# Patient Record
Sex: Female | Born: 1950 | Race: White | Hispanic: No | Smoking: Never smoker
Health system: Southern US, Community
[De-identification: ages and names within clinical notes are randomized; demographics above are authoritative.]

## PROBLEM LIST (undated history)

## (undated) DIAGNOSIS — M1990 Unspecified osteoarthritis, unspecified site: Secondary | ICD-10-CM

## (undated) DIAGNOSIS — K219 Gastro-esophageal reflux disease without esophagitis: Secondary | ICD-10-CM

## (undated) DIAGNOSIS — G4700 Insomnia, unspecified: Secondary | ICD-10-CM

## (undated) DIAGNOSIS — F32A Depression, unspecified: Secondary | ICD-10-CM

## (undated) DIAGNOSIS — J189 Pneumonia, unspecified organism: Secondary | ICD-10-CM

## (undated) DIAGNOSIS — J45909 Unspecified asthma, uncomplicated: Secondary | ICD-10-CM

## (undated) DIAGNOSIS — J40 Bronchitis, not specified as acute or chronic: Secondary | ICD-10-CM

## (undated) DIAGNOSIS — E785 Hyperlipidemia, unspecified: Secondary | ICD-10-CM

## (undated) DIAGNOSIS — G8929 Other chronic pain: Secondary | ICD-10-CM

## (undated) DIAGNOSIS — J069 Acute upper respiratory infection, unspecified: Secondary | ICD-10-CM

## (undated) DIAGNOSIS — F419 Anxiety disorder, unspecified: Secondary | ICD-10-CM

## (undated) DIAGNOSIS — H1010 Acute atopic conjunctivitis, unspecified eye: Secondary | ICD-10-CM

## (undated) DIAGNOSIS — R0600 Dyspnea, unspecified: Secondary | ICD-10-CM

## (undated) HISTORY — DX: Acute atopic conjunctivitis, unspecified eye: H10.10

## (undated) HISTORY — DX: Unspecified asthma, uncomplicated: J45.909

## (undated) HISTORY — DX: Acute upper respiratory infection, unspecified: J06.9

## (undated) HISTORY — PX: AUGMENTATION MAMMAPLASTY: SUR837

## (undated) HISTORY — PX: ADENOIDECTOMY: SUR15

---

## 1975-10-08 HISTORY — PX: TONSILLECTOMY AND ADENOIDECTOMY: SUR1326

## 2000-08-05 ENCOUNTER — Ambulatory Visit (HOSPITAL_COMMUNITY): Admission: RE | Admit: 2000-08-05 | Discharge: 2000-08-05 | Payer: Self-pay

## 2003-06-23 ENCOUNTER — Other Ambulatory Visit: Admission: RE | Admit: 2003-06-23 | Discharge: 2003-06-23 | Payer: Self-pay | Admitting: Obstetrics and Gynecology

## 2005-07-19 ENCOUNTER — Other Ambulatory Visit: Admission: RE | Admit: 2005-07-19 | Discharge: 2005-07-19 | Payer: Self-pay | Admitting: Obstetrics and Gynecology

## 2005-08-23 ENCOUNTER — Ambulatory Visit (HOSPITAL_COMMUNITY): Admission: RE | Admit: 2005-08-23 | Discharge: 2005-08-23 | Payer: Self-pay | Admitting: Obstetrics and Gynecology

## 2005-08-23 ENCOUNTER — Encounter (INDEPENDENT_AMBULATORY_CARE_PROVIDER_SITE_OTHER): Payer: Self-pay | Admitting: Specialist

## 2007-02-23 ENCOUNTER — Encounter (INDEPENDENT_AMBULATORY_CARE_PROVIDER_SITE_OTHER): Payer: Self-pay | Admitting: Gastroenterology

## 2007-02-23 ENCOUNTER — Ambulatory Visit (HOSPITAL_COMMUNITY): Admission: RE | Admit: 2007-02-23 | Discharge: 2007-02-23 | Payer: Self-pay | Admitting: Gastroenterology

## 2007-03-02 ENCOUNTER — Emergency Department (HOSPITAL_COMMUNITY): Admission: EM | Admit: 2007-03-02 | Discharge: 2007-03-02 | Payer: Self-pay | Admitting: Emergency Medicine

## 2007-11-15 ENCOUNTER — Emergency Department (HOSPITAL_COMMUNITY): Admission: EM | Admit: 2007-11-15 | Discharge: 2007-11-15 | Payer: Self-pay | Admitting: Family Medicine

## 2008-12-26 ENCOUNTER — Emergency Department (HOSPITAL_COMMUNITY): Admission: EM | Admit: 2008-12-26 | Discharge: 2008-12-26 | Payer: Self-pay | Admitting: Emergency Medicine

## 2009-01-24 ENCOUNTER — Ambulatory Visit: Payer: Self-pay | Admitting: Internal Medicine

## 2009-03-20 ENCOUNTER — Ambulatory Visit: Payer: Self-pay | Admitting: Internal Medicine

## 2009-05-25 ENCOUNTER — Ambulatory Visit: Payer: Self-pay | Admitting: Internal Medicine

## 2009-09-07 ENCOUNTER — Emergency Department (HOSPITAL_COMMUNITY): Admission: EM | Admit: 2009-09-07 | Discharge: 2009-09-07 | Payer: Self-pay | Admitting: Family Medicine

## 2009-12-05 ENCOUNTER — Ambulatory Visit: Payer: Self-pay | Admitting: Internal Medicine

## 2010-03-02 ENCOUNTER — Ambulatory Visit: Payer: Self-pay | Admitting: Internal Medicine

## 2010-04-05 ENCOUNTER — Ambulatory Visit: Payer: Self-pay | Admitting: Internal Medicine

## 2010-04-05 ENCOUNTER — Telehealth (INDEPENDENT_AMBULATORY_CARE_PROVIDER_SITE_OTHER): Payer: Self-pay | Admitting: *Deleted

## 2010-04-13 ENCOUNTER — Ambulatory Visit: Payer: Self-pay | Admitting: Internal Medicine

## 2010-04-13 DIAGNOSIS — J309 Allergic rhinitis, unspecified: Secondary | ICD-10-CM | POA: Insufficient documentation

## 2010-04-13 DIAGNOSIS — J3089 Other allergic rhinitis: Secondary | ICD-10-CM | POA: Insufficient documentation

## 2010-04-14 DIAGNOSIS — H1045 Other chronic allergic conjunctivitis: Secondary | ICD-10-CM | POA: Insufficient documentation

## 2010-04-14 DIAGNOSIS — H1010 Acute atopic conjunctivitis, unspecified eye: Secondary | ICD-10-CM | POA: Insufficient documentation

## 2010-04-24 ENCOUNTER — Telehealth (INDEPENDENT_AMBULATORY_CARE_PROVIDER_SITE_OTHER): Payer: Self-pay | Admitting: *Deleted

## 2010-05-03 ENCOUNTER — Ambulatory Visit: Payer: Self-pay | Admitting: Internal Medicine

## 2010-05-09 ENCOUNTER — Ambulatory Visit: Payer: Self-pay | Admitting: Internal Medicine

## 2010-05-09 ENCOUNTER — Telehealth (INDEPENDENT_AMBULATORY_CARE_PROVIDER_SITE_OTHER): Payer: Self-pay | Admitting: *Deleted

## 2010-05-14 ENCOUNTER — Telehealth (INDEPENDENT_AMBULATORY_CARE_PROVIDER_SITE_OTHER): Payer: Self-pay | Admitting: *Deleted

## 2010-05-21 ENCOUNTER — Ambulatory Visit: Payer: Self-pay | Admitting: Internal Medicine

## 2010-05-24 ENCOUNTER — Ambulatory Visit: Payer: Self-pay | Admitting: Internal Medicine

## 2010-05-29 ENCOUNTER — Telehealth (INDEPENDENT_AMBULATORY_CARE_PROVIDER_SITE_OTHER): Payer: Self-pay | Admitting: *Deleted

## 2010-05-29 ENCOUNTER — Ambulatory Visit: Payer: Self-pay | Admitting: Internal Medicine

## 2010-06-01 ENCOUNTER — Ambulatory Visit: Payer: Self-pay | Admitting: Internal Medicine

## 2010-06-05 ENCOUNTER — Ambulatory Visit: Payer: Self-pay | Admitting: Internal Medicine

## 2010-07-04 ENCOUNTER — Ambulatory Visit: Payer: Self-pay | Admitting: Internal Medicine

## 2010-07-23 ENCOUNTER — Telehealth: Payer: Self-pay | Admitting: Internal Medicine

## 2010-08-10 ENCOUNTER — Ambulatory Visit: Payer: Self-pay | Admitting: Internal Medicine

## 2010-08-10 DIAGNOSIS — R0602 Shortness of breath: Secondary | ICD-10-CM | POA: Insufficient documentation

## 2010-08-10 DIAGNOSIS — G4700 Insomnia, unspecified: Secondary | ICD-10-CM | POA: Insufficient documentation

## 2010-10-25 ENCOUNTER — Ambulatory Visit: Payer: Self-pay | Admitting: Internal Medicine

## 2010-11-06 NOTE — Progress Notes (Signed)
Summary: ALLERGY  Phone Note Other Incoming   Caller: Pt. came in for shot today. Summary of Call: Mrs.Monday said she had discussed her eventually giving her own with you. She wants to know when that might be. She doesn't want to come over here twice a wk. for the next 2 1/2 months.(give or take) I just gave her 0.5 of 1:50,000.(vac.was made 05-09-10)   Initial call taken by: Dimas Millin,  May 29, 2010 4:08 PM  Follow-up for Phone Call        OK to to teach her to give her own. She is an [employer] fundraiser. Follow-up by: Waymon Budge MD,  May 29, 2010 7:56 PM    New/Updated Medications: * ALLERGY VACCINE GO Build to [facility]

## 2010-11-06 NOTE — Progress Notes (Signed)
Summary: Consult appt  Phone Note Call from Patient Call back at [phone]   Caller: Patient Call For: young Summary of Call: Has appt for ALC on 7/8, c/o sob, severe headache, wants to be seen today, pls advise. Initial call taken by: Darletta Moll,  April 05, 2010 8:36 AM  Follow-up for Phone Call        The patient cancelled her appt today because she had to work all day. She now wants to be seen and there are no available appts . She refused to sch with another provider for sob. She is sch for 04/13/2010 with Dr. Maple Hudson for an allergy consult. She will need to call her PCP for any acute problems. The patient is aware to contact her PCP and may callback to see if Dr. Maple Hudson has had any cancellations. Follow-up by: Michel Bickers CMA,  April 05, 2010 9:27 AM

## 2010-11-06 NOTE — Miscellaneous (Signed)
Summary: Injection Record / [facility]    Injection Record / [facility]    Imported By: Lennie Odor 06/08/2010 10:08:56  _____________________________________________________________________  External Attachment:    Type:   Image     Comment:   External Document

## 2010-11-06 NOTE — Progress Notes (Signed)
Summary: having reaction to allergy shots  Phone Note Call from Patient   Caller: Robin Chaney Call For: Dr. Jetty Duhamel Details for Reason: reaction to allergy shot Summary of Call: Good morning Dr. 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 She works at [employer]. She says that she has been watching her vaccine for the last several weeks and it is making her feel worse. She says that she is having joint pain and she feels like she can't take a deep breath. She says she has had pneumonia before and she says it feels similar to that as far as being able to breath deep. 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 She says that we can call her at work at [phone] or on her cell phone [phone] and leave a message.I ask her where she is giving herself the shot at and she told me that it is in different places leg, thigh and stomach and she said that should not make any difference because they are all subq. Please advise. Thanks. Initial call taken by: Clarise Cruz Duncan Dull),  July 23, 2010 10:04 AM  Follow-up for Phone Call        She has reached 0.4 ml of 1:500 on standard build up protocol.  She thinks she is noting some chest tightness and joint pain on shot days. We discussed options. Discussed anaphyllaxis and serum sickness. Plan: 1) skip shots this week - Tuesday and Thursday         2)  Start back and hold at 0.1 ml per dose next week         3)  Make appointment to see me in f/u as she had been directed after last visit.          4) She will come by tomorrow to pick up VentolinHFA sample for 2 puffs, four times a day as needed          4) If any question, she will stop shots completely.   Follow-up by: Waymon Budge MD,  July 23, 2010 5:49 PM

## 2010-11-06 NOTE — Progress Notes (Signed)
Summary: ALT appt-returned call  Phone Note Outgoing Call Call back at [facility] Phone [phone] Call back at Work Phone [phone]   Call placed by: Reynaldo Minium CMA,  April 24, 2010 9:10 AM Call placed to: Patient Action Taken: Appt scheduled Summary of Call: Attempted to call pt at both numbers to schedule allergy skin testing appt and give protocols for ALT; had to leave message on Home number as no one answered the work number. Initial call taken by: Reynaldo Minium CMA,  April 24, 2010 9:11 AM  Follow-up for Phone Call        pt returned call. i gave her next avail for ALT but she says this is too far out. call her at work [phone]. Tivis Ringer, CNA  April 24, 2010 10:20 AM  Additional Follow-up for Phone Call Additional follow up Details #1::        Please see if patient can come in on 05-02-10 at 11am for allergy testing. Remind her of ALt protocol; no antihistimaines, no OTC cough syrups, OTC sleep aids for 3 days prior to testing. Expect to be here approx 1.5 hours for testing.Reynaldo Minium CMA  April 24, 2010 10:30 AM     Additional Follow-up for Phone Call Additional follow up Details #2::    spoke to pt again. she can only come on 7/26 or 7/28 in the afternoon (she gets off at 2:30 on 7/26 and is off at 1:00pm on the 28th. (she couldn't come on the 27th) please advise. Tivis Ringer, CNA  April 24, 2010 10:36 AM   Please have patient come in on Thursday 7-28 at 115pm; okay to double book him in the 130 slot per me. Reynaldo Minium CMA  April 24, 2010 12:03 PM   appt has been made- 7/28 at 1:15. Tivis Ringer, CNA  April 24, 2010 2:51 PM

## 2010-11-06 NOTE — Miscellaneous (Signed)
Summary: Allergy Skin Test / [facility]  Allergy Skin Test / [facility]   Imported By: Lennie Odor 05/10/2010 13:52:08  _____________________________________________________________________  External Attachment:    Type:   Image     Comment:   External Document

## 2010-11-06 NOTE — Miscellaneous (Signed)
Summary: Allergy Engineer, [facility]   Imported By: Sherian Rein 06/08/2010 08:51:02  _____________________________________________________________________  External Attachment:    Type:   Image     Comment:   External Document

## 2010-11-06 NOTE — Assessment & Plan Note (Signed)
Summary: allergy problem/apc   Vital Signs:  Patient profile:   60 year old female Height:      64 inches Weight:      144.31 pounds BMI:     24.86 O2 Sat:      97 % on Room air Pulse rate:   73 / minute BP sitting:   130 / 68  (right arm) Cuff size:   regular  Vitals Entered By: Reynaldo Minium CMA (April 13, 2010 3:29 PM)  O2 Flow:  Room air CC: Allergy New patient.   Primary Provider/Referring Provider:  Eden Emms Baxley  CC:  Allergy New patient..  History of Present Illness: April 13, 2010- 59 yoF never smoker, self referred for allergy evaluation. Adult onset symptoms, but years ago nasal congestion was seasonal and managed otc. Over the past 4-5 years nasal congestion, sneezing and itching have been getting worse. Weather changes, seasonal pollens, moisture, air conditioning and alcohol all seem to aggravate the congestion with difficulty breathing through her nose. Hot weather takes her breath, but she has never been dx'd with asthma. Gets bronchitis about once/ year. Cats are not tolerated. She sleeps with her dogs. Strong local reactions to insect bites and poison ivy.  Took a prednisone dospak last week with Avelox. Feeling better today.Decongestants uncertain benefit. Has tried most available antihistamines. Patanol for eyes itching and burning. Does not wear contacts. Allegra is some help- switches between this and Zyrtec.. Had pneumonia as a child. Pneumovax x 1. Lives in older house, basement gets moist. 2 dogs. No smokers.  Preventive Screening-Counseling & Management  Alcohol-Tobacco     Smoking Status: never  Current Medications (verified): 1)  Menest 0.625 Mg Tabs (Esterified Estrogens) .... Take 1 By Mouth Once Daily 2)  Allegra 180 Mg Tabs (Fexofenadine Hcl) .... Take 1 By Mouth Once Daily As Needed 3)  Zyrtec Hives Relief 10 Mg Tabs (Cetirizine Hcl) .... Take 1 By Mouth Once Daily As Needed 4)  Claritin 10 Mg Tabs (Loratadine) .... Take 1 By Mouth Once Daily  As Needed 5)  Lexapro 20 Mg Tabs (Escitalopram Oxalate) .... Take 1 By Mouth Once Daily 6)  Calcium Carbonate 600 Mg Tabs (Calcium Carbonate) .... Take 1 By Mouth Once Daily 7)  Vitamin D 1000 Unit Tabs (Cholecalciferol) .... Take 1 By Mouth Once Daily 8)  Excedrin Migraine 250-250-65 Mg Tabs (Aspirin-Acetaminophen-Caffeine) .... Per Bottle As Needed Headaches 9)  Ibuprofen 200 Mg Tabs (Ibuprofen) .... Per Bottle As Needed Headaches  Allergies (verified): 1)  ! Codeine 2)  ! 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  The patient denies shortness of breath with activity, shortness of breath at rest, chest pain, irregular heartbeats, acid heartburn, indigestion, loss of appetite, weight change, abdominal pain, difficulty swallowing, sore throat, tooth/dental problems, ear ache, depression, hand/feet swelling, joint stiffness or pain, rash, change in color of mucus, and fever.    Physical Exam  Additional Exam:  General: A/Ox3; pleasant and cooperative, NAD, well-appearing SKIN: no rash, lesions NODES: no lymphadenopathy  HEENT: [place]/AT, EOM- WNL, Conjuctivae- clear, PERRLA, TM-WNL, Nose- clear, deviated septum to left, no polyps, Throat- clear and wnl, Mallampati  II NECK: Supple w/ fair ROM, JVD- none, normal carotid impulses w/o bruits Thyroid- normal to palpation CHEST: Clear to P&A HEART: RRR, no m/g/r heard ABDOMEN:  Trim MWN:UUVO, nl pulses, no edema  NEURO: Grossly intact to observation      Impression & Recommendations:  Problem # 1:  ALLERGIC RHINITIS (ICD-477.9)  We discussed allergy control. Information given on environmental/ dust precautions. We will add a steroid nasal spray since she has not tried this.  She has worked through enough meds that what she is really interested in is allergy testing and possibly allergy hyposensitization.. Her updated medication list for this problem includes:    Allegra 180 Mg Tabs (Fexofenadine hcl) .Marland Kitchen... Take 1 by mouth once daily as needed    Zyrtec Hives Relief 10 Mg Tabs (Cetirizine hcl) .Marland Kitchen... Take 1 by mouth once daily as needed    Claritin 10 Mg Tabs (Loratadine) .Marland Kitchen... Take 1 by mouth once daily as needed  Problem # 2:  ALLERGIC CONJUNCTIVITIS (ICD-372.14) This will probably relate to her rhinitis, but we will look for opportunities to give symptomatic relief. For now she continues Patanol.  Medications Added to Medication List This Visit: 1)  Menest 0.625 Mg Tabs (Esterified estrogens) .... Take 1 by mouth once daily 2)  Allegra 180 Mg Tabs (Fexofenadine hcl) .... Take 1 by mouth once daily as needed 3)  Zyrtec Hives Relief 10 Mg Tabs (Cetirizine hcl) .... Take 1 by mouth once daily as needed 4)  Claritin 10 Mg Tabs (Loratadine) .... Take 1 by mouth once daily as needed 5)  Lexapro 20 Mg Tabs (Escitalopram oxalate) .... Take 1 by mouth once daily 6)  Calcium Carbonate 600 Mg Tabs (Calcium carbonate) .... Take 1 by mouth once daily 7)  Vitamin D 1000 Unit Tabs (Cholecalciferol) .... Take 1 by mouth once daily 8)  Excedrin Migraine 250-250-65 Mg Tabs (Aspirin-acetaminophen-caffeine) .... Per bottle as needed headaches 9)  Ibuprofen 200 Mg Tabs (Ibuprofen) .... Per bottle as needed headaches  Other Orders: New Patient Level III (53664)  Patient Instructions: 1)  Return as able for allergy testing- Stop all antihistamines 3 days before skin testing,  including cold and allergy meds, otc sleep and cough meds.  2)  Try sample nasal steroid spray: 2 sprays each nostril at bedtime daily 3)  Consider the info on dust and environmental controls.

## 2010-11-06 NOTE — Progress Notes (Signed)
Summary: begin allergy shots today  Phone Note Call from Patient   Caller: Patient Call For: young Summary of Call: pt wants to make sure she can start her allergy shot today. [phone] (work) Initial call taken by: Tivis Ringer, CNA,  May 14, 2010 9:20 AM  Follow-up for Phone Call        Spoke with Rosalita Chessman in Allergy Lab.  Per Rosalita Chessman, pt can start allergy shots today and inform her she will need to wait approx after injection.  Gweneth Dimitri RN  May 14, 2010 9:25 AM  Called, spoke with pt.  Pt advised of above--she verbalized understanding.  Gweneth Dimitri RN  May 14, 2010 9:28 AM

## 2010-11-06 NOTE — Progress Notes (Signed)
Summary: allergy shots  Phone Note Call from Patient   Caller: Patient Call For: young Summary of Call: pt waiting to hear re: beginning allergy shots. [phone] Initial call taken by: Tivis Ringer, CNA,  May 09, 2010 10:01 AM  Follow-up for Phone Call        Spoke with Rosalita Chessman in allergy who advise they are mixing pt's vaccine today and pt can come in any time starting tomorrow between 8:30-5:30. Everything will be discuss at initial visit.  LMOMTCB x 1. Zackery Barefoot CMA  May 09, 2010 10:19 AM  [facility]  Aundra Millet Reynolds LPN  May 10, 2010 10:58 AM  [facility] x3 Vernie Murders  May 11, 2010 11:06 AM   Additional Follow-up for Phone Call Additional follow up Details #1::        duplicate phone message--pls see phone message from 05/14/10 for additional info.  Gweneth Dimitri RN  May 14, 2010 9:26 AM

## 2010-11-06 NOTE — Assessment & Plan Note (Signed)
Summary: ROV//MBW   Primary Provider/Referring Provider:  Eden Emms Baxley  CC:  Follow up visit-allergies; Stuffy today.Marland Kitchen  History of Present Illness: April 13, 2010- 59 yoF never smoker, self referred for allergy evaluation. Adult onset symptoms, but years ago nasal congestion was seasonal and managed otc. Over the past 4-5 years nasal congestion, sneezing and itching have been getting worse. Weather changes, seasonal pollens, moisture, air conditioning and alcohol all seem to aggravate the congestion with difficulty breathing through her nose. Hot weather takes her breath, but she has never been dx'd with asthma. Gets bronchitis about once/ year. Cats are not tolerated. She sleeps with her dogs. Strong local reactions to insect bites and poison ivy.  Took a prednisone dospak last week with Avelox. Feeling better today.Decongestants uncertain benefit. Has tried most available antihistamines. Patanol for eyes itching and burning. Does not wear contacts. Allegra is some help- switches between this and Zyrtec.. Had pneumonia as a child. Pneumovax x 1. Lives in older house, basement gets moist. 2 dogs. No smokers.  May 03, 2010- Allergic rhinitis Here for allergy testing off antihistamines. No problems or changes since last here. Tired from early start at work. Minor congestion. We discussed the skin test technique. Skin test- Pos grass, dock, tree. She preferred to limit intradermals to a few key groups.  August 10, 2010-Allergic rhinitis Nurse-CC: Follow up visit-allergies; Stuffy today. Had called in mid October about chest tightness that seemed episodically related to her allergy shots. She skipped vacine for a week then restarted and built back to 0.72ml. She has had a persistent sense of chest tightness. Dr Lenord Fellers gave some Avelox. She has been exposed to friend with cold. In retrospect she doesn't feel this sensation has been related to the allergy vaccine. She notes chest tightness, a  minor wheeze not affected by Ventolin sample, and a little cough. She says this isn't as bad as a pneumonia she had in past.  Irritant exposure to cleaner at work. Has added a cat to her 2 dogs about 2 weeks ago.   Preventive Screening-Counseling & Management  Alcohol-Tobacco     Smoking Status: never  Current Medications (verified): 1)  Prempro 0.625-2.5 Mg Tabs (Conj Estrog-Medroxyprogest Ace) .... Take 1 By Mouth Once Daily 2)  Allegra 180 Mg Tabs (Fexofenadine Hcl) .... Take 1 By Mouth Once Daily As Needed 3)  Zyrtec Hives Relief 10 Mg Tabs (Cetirizine Hcl) .... Take 1 By Mouth Once Daily As Needed 4)  Claritin 10 Mg Tabs (Loratadine) .... Take 1 By Mouth Once Daily As Needed 5)  Calcium Carbonate 600 Mg Tabs (Calcium Carbonate) .... Take 1 By Mouth Once Daily 6)  Vitamin D 1000 Unit Tabs (Cholecalciferol) .... Take 1 By Mouth Once Daily 7)  Excedrin Migraine 250-250-65 Mg Tabs (Aspirin-Acetaminophen-Caffeine) .... Per Bottle As Needed Headaches 8)  Ibuprofen 200 Mg Tabs (Ibuprofen) .... Per Bottle As Needed Headaches 9)  Epipen 0.3 Mg/0.56ml Devi (Epinephrine) .... For Severe Allergic Reaction 10)  Allergy Vaccine Go .... Build To Maint 11)  Allegra-D Allergy & Congestion 60-120 Mg Xr12h-Tab (Fexofenadine-Pseudoephedrine) .... Take 1 By Mouth Once Daily As Needed  Allergies (verified): 1)  ! Codeine 2)  ! 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  The patient denies shortness of breath with activity, shortness of breath at rest, productive cough, non-productive cough, coughing up blood, chest pain, irregular heartbeats, acid heartburn, indigestion, loss of appetite, weight change, abdominal pain, difficulty swallowing, sore throat, tooth/dental problems, headaches, ear ache, hand/feet swelling, rash, change in color of mucus, and fever.    Vital Signs:  Patient profile:   60 year old female Height:      64 inches Weight:      141.50 pounds BMI:     24.38 O2 Sat:      98 % on Room air Pulse rate:   71 / minute BP sitting:   108 / 70  (right arm) Cuff size:   regular  Vitals Entered By: Reynaldo Minium CMA (August 10, 2010 10:42 AM)  O2 Flow:  Room air CC: Follow up visit-allergies; Stuffy today.   Physical Exam  Additional Exam:  General: A/Ox3; pleasant and cooperative, NAD, well-appearing, slender SKIN: no rash, lesions NODES: no lymphadenopathy HEENT: [place]/AT, EOM- WNL, Conjuctivae- clear, PERRLA, TM-WNL, Nose- clear, deviated septum to left, no polyps, Throat- clear and wnl, Mallampati  II NECK: Supple w/ fair ROM, JVD- none, normal carotid impulses w/o bruits Thyroid- normal to palpation CHEST: Clear to P&A HEART: RRR, no m/g/r heard ABDOMEN: Trim [id number]:[id number], nl pulses, no edema  NEURO: Grossly intact to observation      Impression & Recommendations:  Problem # 1:  ALLERGIC CONJUNCTIVITIS (ICD-372.14) Minor issue right now, but watch for seasonal dryness of eyes with indoor heat.   Problem # 2:  ALLERGIC RHINITIS (ICD-477.9) We again reviewed goals, risks and limits of allergy vaccine therapy carefully. Ansswered her questions. She is determined to try to give this therapy a chance to work.   Her updated medication list for this problem includes:    Allegra 180 Mg Tabs (Fexofenadine hcl) .Marland Kitchen... Take 1 by mouth  once daily as needed    Zyrtec Hives Relief 10 Mg Tabs (Cetirizine hcl) .Marland Kitchen... Take 1 by mouth once daily as needed    Claritin 10 Mg Tabs (Loratadine) .Marland Kitchen... Take 1 by mouth once daily as needed  Problem # 3:  DYSPNEA (ICD-786.05)  Mild / subtle exertional dyspnea. Not clearly related to her allergy vaccine. I am more suspicious of irritation by the cleanser being used at the hospital. She is not wheezing or coughing productively. An esophagitis might give similar discomfort.  We restarted her vaccine at 0.1 x 2 weeks then 0.2 x 2 weeks.  Discussed Singulair.  Denies heartburn   Problem # 4:  INSOMNIA (ICD-780.52) Mentions insomnia as a chronic intermittent problem. Takes occasional ambien. Didn't like aftertaste with Lunesta and didn't find it very helpfull. Samples Rozerem  Medications Added to Medication List This Visit: 1)  Prempro 0.625-2.5 Mg Tabs (Conj estrog-medroxyprogest ace) .... Take 1 by mouth once daily 2)  Allegra-d Allergy & Congestion 60-120 Mg Xr12h-tab (Fexofenadine-pseudoephedrine) .... Take 1 by mouth once daily as needed 3)  Singulair 10 Mg Tabs (Montelukast sodium) .Marland Kitchen.. 1 daily  Other Orders: Est. Patient Level III (56387)  Patient Instructions: 1)  Please schedule a follow-up appointment in 1 month. 2)  Resume building allergy vaccine by 0.1 ml each time, twice weekly.  3)  When you get to 0.5 ml, hold at that level and change to once per week.  4)  0.5 ml once weekly will be your maintenance dose for now.  5)  Ok to try the Ventolin from time to time to see if it helps.  6)  sample/ script Singulair 10 mg, once daily- see if it helps the chest sensation. it is subtle so give it several days at least.  7)  Samples Rozerem 1 each night Prescriptions: SINGULAIR 10 MG TABS (MONTELUKAST SODIUM) 1 daily  #30 x prn   Entered and Authorized by:   Waymon Budge MD   Signed by:   Waymon Budge MD on 08/10/2010   Method used:   Print then Give to Patient   RxID:    [id number]

## 2010-11-06 NOTE — Assessment & Plan Note (Signed)
Summary: ALLERGY SKIN TEST- PER [name]//kp   Vital Signs:  Patient profile:   60 year old female Height:      64 inches Weight:      142.25 pounds BMI:     24.51 O2 Sat:      98 % on Room air Pulse rate:   69 / minute BP sitting:   108 / 64  (left arm) Cuff size:   regular  Vitals Entered By: Reynaldo Minium CMA (May 03, 2010 1:34 PM)  O2 Flow:  Room air CC: Allergy Testing   Primary Provider/Referring Provider:  Eden Emms Baxley  CC:  Allergy Testing.  History of Present Illness: History of Present Illness: April 13, 2010- 59 yoF never smoker, self referred for allergy evaluation. Adult onset symptoms, but years ago nasal congestion was seasonal and managed otc. Over the past 4-5 years nasal congestion, sneezing and itching have been getting worse. Weather changes, seasonal pollens, moisture, air conditioning and alcohol all seem to aggravate the congestion with difficulty breathing through her nose. Hot weather takes her breath, but she has never been dx'd with asthma. Gets bronchitis about once/ year. Cats are not tolerated. She sleeps with her dogs. Strong local reactions to insect bites and poison ivy.  Took a prednisone dospak last week with Avelox. Feeling better today.Decongestants uncertain benefit. Has tried most available antihistamines. Patanol for eyes itching and burning. Does not wear contacts. Allegra is some help- switches between this and Zyrtec.. Had pneumonia as a child. Pneumovax x 1. Lives in older house, basement gets moist. 2 dogs. No smokers.  May 03, 2010- Allergic rhinitis Here for allergy testing off antihistamines. No problems or changes since last here. Tired from early start at work. Minor congestion. We discussed the skin test technique. Skin test- Pos grass, dock, tree. She preferred to limit intradermals to a few key groups.   Preventive Screening-Counseling & Management  Alcohol-Tobacco     Smoking Status: never  Current Medications  (verified): 1)  Menest 0.625 Mg Tabs (Esterified Estrogens) .... Take 1 By Mouth Once Daily 2)  Allegra 180 Mg Tabs (Fexofenadine Hcl) .... Take 1 By Mouth Once Daily As Needed 3)  Zyrtec Hives Relief 10 Mg Tabs (Cetirizine Hcl) .... Take 1 By Mouth Once Daily As Needed 4)  Claritin 10 Mg Tabs (Loratadine) .... Take 1 By Mouth Once Daily As Needed 5)  Lexapro 20 Mg Tabs (Escitalopram Oxalate) .... Take 1 By Mouth Once Daily 6)  Calcium Carbonate 600 Mg Tabs (Calcium Carbonate) .... Take 1 By Mouth Once Daily 7)  Vitamin D 1000 Unit Tabs (Cholecalciferol) .... Take 1 By Mouth Once Daily 8)  Excedrin Migraine 250-250-65 Mg Tabs (Aspirin-Acetaminophen-Caffeine) .... Per Bottle As Needed Headaches 9)  Ibuprofen 200 Mg Tabs (Ibuprofen) .... Per Bottle As Needed Headaches  Allergies (verified): 1)  ! Codeine 2)  ! 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  The patient denies shortness of breath with activity, shortness of breath at rest, productive cough, non-productive cough, coughing up blood, chest pain, irregular heartbeats, acid heartburn, indigestion, loss of appetite, weight change, abdominal pain, difficulty swallowing, sore throat, tooth/dental  problems, headaches, and sneezing.    Physical Exam  Additional Exam:  General: A/Ox3; pleasant and cooperative, NAD, well-appearing, slender SKIN: no rash, lesions NODES: no lymphadenopathy HEENT: [place]/AT, EOM- WNL, Conjuctivae- clear, PERRLA, TM-WNL, Nose- clear, deviated septum to left, no polyps, Throat- clear and wnl, Mallampati  II NECK: Supple w/ fair ROM, JVD- none, normal carotid impulses w/o bruits Thyroid- normal to palpation CHEST: Clear to P&A HEART: RRR, no m/g/r heard ABDOMEN: Trim WUJ:WJXB, nl pulses, no edema  NEURO: Grossly intact to observation      Impression & Recommendations:  Problem # 1:  ALLERGIC RHINITIS (ICD-477.9)  She is actively interested in trying allergy vaccine as discussed last time. I went over realistic goals, expectations, risks and logistics. 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 She will start here. Her updated medication list for this problem includes:    Allegra 180 Mg Tabs (Fexofenadine hcl) .Marland Kitchen... Take 1 by mouth once daily as needed    Zyrtec Hives Relief 10 Mg Tabs (Cetirizine hcl) .Marland Kitchen... Take 1 by mouth once daily as needed    Claritin 10 Mg Tabs (Loratadine) .Marland Kitchen... Take 1 by mouth once daily as needed  Medications Added to Medication List This Visit: 1)  Epipen 0.3 Mg/0.29ml Devi (Epinephrine) .... For severe allergic reaction 2)  Allergy Vaccine Gh/ Teach New Start  .Marland Kitchen.. 1:50,000  Other Orders: Est. Patient Level II (14782) Allergy Puncture Test (95621) Allergy I.D Test (30865)  Patient Instructions: 1)  Please schedule a follow-up appointment in 2 months. 2)  We will have the allergy lab call you when your vaccine is ready to start. 3)  script for Epipen Prescriptions: EPIPEN 0.3 MG/0.3ML DEVI (EPINEPHRINE) For severe allergic reaction  #1 x prn   Entered and Authorized by:   Waymon Budge MD   Signed by:   Waymon Budge MD on 05/03/2010   Method used:    Historical   RxID:   [id number]

## 2010-11-06 NOTE — Miscellaneous (Signed)
Summary: New Vaccine/[facility]  New Vaccine/[facility]   Imported By: Sherian Rein 06/07/2010 08:41:25  _____________________________________________________________________  External Attachment:    Type:   Image     Comment:   External Document

## 2010-12-27 ENCOUNTER — Ambulatory Visit (INDEPENDENT_AMBULATORY_CARE_PROVIDER_SITE_OTHER): Payer: 59

## 2010-12-27 DIAGNOSIS — J301 Allergic rhinitis due to pollen: Secondary | ICD-10-CM

## 2011-01-03 ENCOUNTER — Ambulatory Visit (INDEPENDENT_AMBULATORY_CARE_PROVIDER_SITE_OTHER): Payer: 59 | Admitting: Internal Medicine

## 2011-01-03 DIAGNOSIS — M549 Dorsalgia, unspecified: Secondary | ICD-10-CM

## 2011-02-13 ENCOUNTER — Telehealth: Payer: Self-pay | Admitting: Internal Medicine

## 2011-02-13 NOTE — Telephone Encounter (Signed)
Refer to Urgent Care as doctor is not in the office today.

## 2011-02-13 NOTE — Telephone Encounter (Signed)
Spoke with patient and this is on going back pain of several months duration. No radiculopathy. Taking ibuprofen, but requesting depo-medrol to calm down the inflammation. Knows you are out of the office today.

## 2011-02-19 NOTE — Op Note (Signed)
Robin Chaney, GAMBONE NO.:  [id number]   MEDICAL RECORD NO.:  [id number]          PATIENT TYPE:  AMB   LOCATION:  ENDO                         FACILITY:  [facility]   PHYSICIAN:  Petra Kuba, M.D.    DATE OF BIRTH:  12-23-1950   DATE OF PROCEDURE:  02/23/2007  DATE OF DISCHARGE:                               OPERATIVE REPORT   PROCEDURE:  Colonoscopy.   INDICATIONS:  Screening.  Consent was signed after risks, benefits,  methods, options thoroughly discussed with my nurse prior to any  sedation.   MEDICINES USED:  Fentanyl 125, Versed 12.5.   PROCEDURE:  Rectal inspection is pertinent for small external  hemorrhoids.  Digital exam was negative.  Video pediatric colonoscope  was inserted and with abdominal pressure easily advanced around the  colon to the cecum.  This did require some abdominal pressure, but no  position changes.  A rare left-sided diverticula was seen on insertion.  No other abnormalities.  The scope was inserted a short ways in the  terminal ileum which was normal.  Photo documentation was obtained.  The  cecum was identified by the appendiceal orifice and ileocecal valve.  Scope was slowly withdrawn.  Prep was adequate.  There was minimal  liquid stool that required washing and suctioning  on slow withdrawal  through the colon.  No additional abnormalities were seen as we slowly  withdrew back to the rectum.  Inside the rectum a tiny hyperplastic  appearing polyp was seen and was cold biopsied x2.  Anorectal pull-  through and retroflexion confirmed the small hemorrhoids.  Scope was  straightened and readvanced short way up the left side of the colon.  Air was suctioned and scope removed.  The patient tolerated the  procedure well.  There was no obvious immediate complication.   ENDOSCOPIC DIAGNOSES:  1. Internal and external small hemorrhoids.  2. Rare left sided diverticula.  3. Tiny rectal hyperplastic appearing polyp cold biopsied.  4.  Otherwise within normal limits to the terminal ileum.   PLAN:  Await pathology.  Probably recheck colon screening in five years.  Happy to see back p.r.n.           ______________________________  Petra Kuba, M.D.     MEM/MEDQ  D:  02/23/2007  T:  02/23/2007  Job:  [id number]   cc:   [name], MD

## 2011-02-20 ENCOUNTER — Encounter: Payer: Self-pay | Admitting: Internal Medicine

## 2011-02-22 NOTE — Op Note (Signed)
NAMELAELIA, Robin Chaney NO.:  [id number]   MEDICAL RECORD NO.:  [id number]          PATIENT TYPE:  AMB   LOCATION:  SDC                           FACILITY:  [facility]   PHYSICIAN:  [name], MD    DATE OF BIRTH:  Jun 03, 1951   DATE OF PROCEDURE:  08/23/2005  DATE OF DISCHARGE:                                 OPERATIVE REPORT   PREOPERATIVE DIAGNOSIS:  Postmenopausal bleeding.   POSTOPERATIVE DIAGNOSIS:  Postmenopausal bleeding.   PROCEDURE:  Hysteroscopy D&C.   SURGEON:  Dr. [name]   COMPLICATIONS:  None.   ESTIMATED BLOOD LOSS:  Minimal.   FINDINGS:  Normal-appearing intrauterine cavity.  No endometrial polyps or  lesions noted on hysteroscopy.   COMPLICATIONS:  None.   CONDITION:  Stable and extubated to recovery room.   INDICATION:  Robin Chaney is a 60 year old white female, who presented to the  office with complaints of postmenopausal bleeding.  We attempted an  endometrial biopsy in the office; however, we were unable to perform  secondary to a stenotic cervix.  Risks, benefits, and alternatives of  hysteroscopy D&C were explained to the patient, and informed consent was  obtained.   PROCEDURE:  The patient was taken to the operating room where a general  anesthesia was easily obtained.  She was placed in the dorsolithotomy  position using Allen stirrups.  She was prepped and draped in sterile  fashion.  A speculum was inserted into the vagina, and a single-tooth  tenaculum was placed on the anterior lip of the cervix.  The cervix was  cannulated with the yellow plastic cervical os finder.  There was no  difficulty with placement.  The cervix was then serial dilated using Pratt  dilators.  The hysteroscope was easily inserted, and the intrauterine cavity  was visualized.  Bilateral os were noted.  The cavity was noted to be clean  of all polyps.  The hysteroscope was then removed, and there was a 40 mL  deficit.  The cervix was then dilated  further using the [facility] dilators, and  curettage was performed.  Endometrial curettage sample was sent to  pathology.  The single-tooth tenaculum was then  removed from the cervix.  Hemostasis was noted.  The speculum was removed  from the vagina, and the patient tolerated the procedure well.  Sponge, lap,  needle, and instrument counts were correct x2.  She was taken to the  recovery room in stable condition.      [name], MD  Electronically Signed     GA/MEDQ  D:  08/23/2005  T:  08/23/2005  Job:  [id number]

## 2011-02-26 ENCOUNTER — Encounter: Payer: Self-pay | Admitting: Internal Medicine

## 2011-02-26 ENCOUNTER — Ambulatory Visit (INDEPENDENT_AMBULATORY_CARE_PROVIDER_SITE_OTHER): Payer: 59 | Admitting: Internal Medicine

## 2011-02-26 VITALS — BP 108/68 | HR 62 | Ht 64.0 in | Wt 141.6 lb

## 2011-02-26 DIAGNOSIS — J309 Allergic rhinitis, unspecified: Secondary | ICD-10-CM

## 2011-02-26 DIAGNOSIS — H1045 Other chronic allergic conjunctivitis: Secondary | ICD-10-CM

## 2011-02-26 NOTE — Progress Notes (Signed)
  Subjective:    Patient ID: Robin Chaney, female    DOB: June 03, 1951, 60 y.o.   MRN: [id number]  HPI 02/26/11- 36 yoF never smoker, followed for allergic rhinitis. Last here August 10, 2010. Lives in older home, hun=mid/ no mold, 2 dogs. We have left her on allergy vaccine at 1:500. Had continued getting large local reactions. She says the shots in the past month had been giving a large local reaction again. These reactions might be related to newest vial started around that time. She is certain that otherwise the shots had made a really significant improvement with decreased rhinitis. Denies tightness in throat, chest tightness or wheeze.    Review of Systems Constitutional:   No weight loss, night sweats,  Fevers, chills, fatigue, lassitude. HEENT:   No headaches,  Difficulty swallowing,  Tooth/dental problems,  Sore throat,                No sneezing, itching, ear ache, nasal congestion, post nasal drip,   CV:  No chest pain,  Orthopnea, PND, swelling in lower extremities, anasarca, dizziness, palpitations  GI  No heartburn, indigestion, abdominal pain, nausea, vomiting, diarrhea, change in bowel habits, loss of appetite  Resp: No shortness of breath with exertion or at rest.  No excess mucus, no productive cough,  No non-productive cough,  No coughing up of blood.  No change in color of mucus.  No wheezing.   Skin: no rash or lesions.  GU: no dysuria, change in color of urine, no urgency or frequency.  No flank pain.  MS:  No joint pain or swelling.  No decreased range of motion.  No back pain.  Psych:  No change in mood or affect. No depression or anxiety.  No memory loss.       Objective:   Physical Exam General- Alert, Oriented, Affect-appropriate, Distress- none acute WDWN  Skin- rash-none, lesions- none, excoriation- none  Lymphadenopathy- none  Head- atraumatic  Eyes- Gross vision intact, PERRLA, conjunctivae clear secretions  Ears- Hearing, canals,  Tm-normal  Nose- Clear, No-Septal dev, mucus, polyps, erosion, perforation   Throat- Mallampati II , mucosa clear , drainage- none, tonsils- atrophic  Neck- flexible , trachea midline, no stridor , thyroid nl, carotid no bruit  Chest - symmetrical excursion , unlabored     Heart/CV- RRR , no murmur , no gallop  , no rub, nl s1 s2                     - JVD- none , edema- none, stasis changes- none, varices- none     Lung- clear to P&A, wheeze- none, cough- none , dullness-none, rub- none     Chest wall-   Abd- tender-no, distended-no, bowel sounds-present, HSM- no  Br/ Gen/ Rectal- Not done, not indicated  Extrem- cyanosis- none, clubbing, none, atrophy- none, strength- nl  Neuro- grossly intact to observation         Assessment & Plan:

## 2011-02-26 NOTE — Patient Instructions (Signed)
I will have the allergy lab remix and resend your vaccine at 1:500.  You will start at 0.1 ml and build by 0.1 ml/ week as tolerated till you get to 0. 5 ml or highest tolerated.

## 2011-03-02 NOTE — Assessment & Plan Note (Signed)
Now has a cat in addition to her 2 dogs. Had felt much better on vaccine, until the local reactions started. Discussed vaccine risk/ benefit, environment precautions- dust, animals out of bedroom. Question need to remix vaccine, option to stop and watch, with usual opinion that continuing 3-5 years gives best possibility of acquired hyposensitization that lasts.

## 2011-03-02 NOTE — Assessment & Plan Note (Signed)
Controlled.  

## 2011-03-05 ENCOUNTER — Ambulatory Visit (INDEPENDENT_AMBULATORY_CARE_PROVIDER_SITE_OTHER): Payer: 59

## 2011-03-05 ENCOUNTER — Telehealth: Payer: Self-pay | Admitting: Internal Medicine

## 2011-03-05 DIAGNOSIS — J309 Allergic rhinitis, unspecified: Secondary | ICD-10-CM

## 2011-03-05 NOTE — Telephone Encounter (Signed)
Spoke with Robin Chaney. She states that at last visit on 5/22 Dr Maple Hudson advised her that she would be receiving her allergy serum in the mail. She is calling to let him know still has not received anything yet. Please advise thanks!

## 2011-03-11 NOTE — Telephone Encounter (Signed)
Will forward to [employer] in allergy to take a look at this and see what happened.

## 2011-03-12 NOTE — Telephone Encounter (Signed)
Called pt. 03-12-2011.( I forgot to check my basket. Sorry!!!) lmom on cell. I called Robin Chaney 03-05-2011 or 03-08-2011 and told her she should get her vac. Within a couple of days. "Okay,Thank you!"(pt.) And this time she doesn't even call us.

## 2011-05-06 ENCOUNTER — Other Ambulatory Visit: Payer: Self-pay

## 2011-05-06 MED ORDER — ZOLPIDEM TARTRATE 10 MG PO TABS: 10.0000 mg | ORAL_TABLET | Freq: Every evening | ORAL | Status: DC | PRN

## 2011-07-25 ENCOUNTER — Encounter: Payer: Self-pay | Admitting: Internal Medicine

## 2011-07-26 ENCOUNTER — Ambulatory Visit (INDEPENDENT_AMBULATORY_CARE_PROVIDER_SITE_OTHER): Payer: 59 | Admitting: Internal Medicine

## 2011-07-26 ENCOUNTER — Encounter: Payer: Self-pay | Admitting: Internal Medicine

## 2011-07-26 DIAGNOSIS — Z23 Encounter for immunization: Secondary | ICD-10-CM

## 2011-07-26 DIAGNOSIS — Z2911 Encounter for prophylactic immunotherapy for respiratory syncytial virus (RSV): Secondary | ICD-10-CM

## 2011-07-26 DIAGNOSIS — G4700 Insomnia, unspecified: Secondary | ICD-10-CM

## 2011-07-26 DIAGNOSIS — J309 Allergic rhinitis, unspecified: Secondary | ICD-10-CM

## 2011-07-26 NOTE — Patient Instructions (Signed)
We will refill Ambien for 6 months if pharmacy calls [place]. Return one year or as needed

## 2011-07-26 NOTE — Progress Notes (Signed)
  Subjective:    Patient ID: Robin Chaney, female    DOB: 04/05/1951, 60 y.o.   MRN: [id number]  HPI 60 year old white female with history of allergic rhinitis insomnia and migraine headaches. Needs to have Ambien refill. Takes as nightly. Doesn't sleep well unless she takes it. Did take allergy injections for about a year or so. Currently not taking allergy vaccine. Not taking any allergy medications such as Zyrtec and Singulair. Never used epinephrine. Occasionally takes calcium and vitamin D but not on a regular basis. Is on estrogen replacement. This is per GYN physician Dr. Renaldo Fiddler. Patient is requesting Zostavax vaccine. Had influenza immunization through employee health.  No known drug allergies  Past medical history has had pneumonia in childhood and as a young adult. Episode of poison ivy June 2010  D&C at [facility] approximately 2008. History of breast implants x2. Tetanus and PPD done through employee health  Patient works as an Charity fundraiser at [employer]. She is divorced.  Nonsmoker. Social alcohol consumption.  Family history remarkable for father who died with lung cancer. Mother living with history of breast cancer. 2 brothers- one with history of hypertension. One sister in good health. No children.      Review of Systems     Objective:   Physical Exam HEENT exam: TMs and pharynx are clear; neck supple without thyromegaly or carotid bruits; chest clear to auscultation; cardiac exam regular rate and rhythm normal S1 and S2 without murmurs; extremities without edema        Assessment & Plan:  Allergic rhinitis  Insomnia  Plan: Okay to reviewed Ambien 10 mg #90 one by mouth each bedtime with one refill if pharmacy contacts [place]. Return one year or as needed. Sees GYN physician yearly.

## 2011-07-29 ENCOUNTER — Other Ambulatory Visit: Payer: Self-pay | Admitting: Internal Medicine

## 2011-07-29 NOTE — Telephone Encounter (Signed)
Pt was given Rx last week for 6 months.

## 2011-07-31 ENCOUNTER — Other Ambulatory Visit: Payer: Self-pay | Admitting: Internal Medicine

## 2011-08-02 ENCOUNTER — Other Ambulatory Visit: Payer: Self-pay | Admitting: Internal Medicine

## 2012-01-06 ENCOUNTER — Other Ambulatory Visit: Payer: Self-pay

## 2012-01-06 ENCOUNTER — Other Ambulatory Visit: Payer: Self-pay | Admitting: Internal Medicine

## 2012-02-25 ENCOUNTER — Telehealth: Payer: Self-pay | Admitting: Internal Medicine

## 2012-02-25 NOTE — Telephone Encounter (Signed)
Pt last seen October 2012. Ambien Rx only good for refills for 6 months. I need to see patient q 6 months for Rx to be refilled. If appointment times are not convenient for patient, she may need to find another provider who can see her at times that better for her.

## 2012-02-25 NOTE — Telephone Encounter (Signed)
She is seen on 5/30.  Adv patient that we do not see patients on Wednesdays.  She was unkind when trying to provider appointments -- only to accept what was convenient that SHE wanted.  She works for [employer] and they only require a 24 hour notice for approved PAL.  She last was seen 07/2011 for Ambien check.  I explained due to the length of time since you last saw her in addition to starting this as a new medication, you would want to see her FIRST!  She was ADAMANT that I send you a note anyway and ask.  Please advise.  Thanks!

## 2012-02-25 NOTE — Telephone Encounter (Signed)
LMOM for pt @ cell # provided as to Dr. Beryle Quant message.  Pt to keep appt on 5/30.  Pt also adv on message that she needs to be seen every 6 months in f/u for med refills.

## 2012-03-05 ENCOUNTER — Ambulatory Visit: Payer: 59 | Admitting: Internal Medicine

## 2012-04-21 ENCOUNTER — Other Ambulatory Visit: Payer: Self-pay

## 2012-04-21 ENCOUNTER — Other Ambulatory Visit: Payer: Self-pay | Admitting: Internal Medicine

## 2012-04-21 NOTE — Telephone Encounter (Signed)
Patient seen for physical examination 07/26/2011. Patient calling for refill on Ambien 10 mg #90. We will refill this #90 with no refill. She will be contacted today and told she needs to book physical examination without GYN exam after 07/25/2012 in order to be eligible for more refills.

## 2012-04-21 NOTE — Telephone Encounter (Signed)
We refilled this in April. Was OV advised? Please check when OV / PE due.

## 2012-06-25 ENCOUNTER — Other Ambulatory Visit: Payer: 59 | Admitting: Internal Medicine

## 2012-06-25 DIAGNOSIS — G4700 Insomnia, unspecified: Secondary | ICD-10-CM

## 2012-06-25 DIAGNOSIS — J309 Allergic rhinitis, unspecified: Secondary | ICD-10-CM

## 2012-06-25 DIAGNOSIS — Z0000 Encounter for general adult medical examination without abnormal findings: Secondary | ICD-10-CM

## 2012-06-25 LAB — COMPREHENSIVE METABOLIC PANEL
ALT: 10 U/L (ref 0–35)
AST: 14 U/L (ref 0–37)
Alkaline Phosphatase: 49 U/L (ref 39–117)
CO2: 26 mEq/L (ref 19–32)
Creat: 0.59 mg/dL (ref 0.50–1.10)
Sodium: 138 mEq/L (ref 135–145)
Total Bilirubin: 0.4 mg/dL (ref 0.3–1.2)
Total Protein: 6 g/dL (ref 6.0–8.3)

## 2012-06-25 LAB — CBC WITH DIFFERENTIAL/PLATELET
Basophils Absolute: 0.1 10*3/uL (ref 0.0–0.1)
Eosinophils Absolute: 0.2 10*3/uL (ref 0.0–0.7)
Eosinophils Relative: 4 % (ref 0–5)
Lymphs Abs: 1.8 10*3/uL (ref 0.7–4.0)
MCH: 30.4 pg (ref 26.0–34.0)
MCV: 89.4 fL (ref 78.0–100.0)
Neutrophils Relative %: 45 % (ref 43–77)
Platelets: 360 10*3/uL (ref 150–400)
RBC: 4.34 MIL/uL (ref 3.87–5.11)
RDW: 13.4 % (ref 11.5–15.5)
WBC: 4.5 10*3/uL (ref 4.0–10.5)

## 2012-06-25 LAB — TSH: TSH: 2.465 u[IU]/mL (ref 0.350–4.500)

## 2012-06-25 LAB — LIPID PANEL
LDL Cholesterol: 92 mg/dL (ref 0–99)
Total CHOL/HDL Ratio: 3 Ratio
VLDL: 20 mg/dL (ref 0–40)

## 2012-06-26 LAB — VITAMIN D 25 HYDROXY (VIT D DEFICIENCY, FRACTURES): Vit D, 25-Hydroxy: 43 ng/mL (ref 30–89)

## 2012-07-23 ENCOUNTER — Encounter: Payer: Self-pay | Admitting: Internal Medicine

## 2012-07-23 ENCOUNTER — Ambulatory Visit (INDEPENDENT_AMBULATORY_CARE_PROVIDER_SITE_OTHER): Payer: 59 | Admitting: Internal Medicine

## 2012-07-23 VITALS — BP 132/72 | HR 76 | Temp 98.3°F | Ht 63.5 in | Wt 138.0 lb

## 2012-07-23 DIAGNOSIS — J309 Allergic rhinitis, unspecified: Secondary | ICD-10-CM

## 2012-07-23 DIAGNOSIS — Z0000 Encounter for general adult medical examination without abnormal findings: Secondary | ICD-10-CM

## 2012-07-23 DIAGNOSIS — Z8669 Personal history of other diseases of the nervous system and sense organs: Secondary | ICD-10-CM

## 2012-07-23 DIAGNOSIS — G4700 Insomnia, unspecified: Secondary | ICD-10-CM

## 2012-07-23 LAB — POCT URINALYSIS DIPSTICK
Bilirubin, UA: NEGATIVE
Blood, UA: NEGATIVE
Leukocytes, UA: NEGATIVE
Nitrite, UA: NEGATIVE
Protein, UA: NEGATIVE
Urobilinogen, UA: NEGATIVE
pH, UA: 6

## 2012-07-23 NOTE — Patient Instructions (Addendum)
Continue same meds and return in one year. Flu shot to be done at work.

## 2012-07-23 NOTE — Progress Notes (Signed)
  Subjective:    Patient ID: Robin Chaney, female    DOB: 1951-01-08, 61 y.o.   MRN: [id number]  HPI 61 year old White female for health maintenance and evaluation of medical problems. History of allergic rhinitis, insomnia, migraine headaches and back pain. No known drug allergies.  History of pneumonia in childhood and as young adult. Poison [facility] June 2010.  Patient is had breast implants twice. Had D&C at [facility] hospital for menorrhagia.  Tetanus immunization done through employee health at [facility]. Influenza immunization given through [facility]. Pneumovax immunization 2002. GYN is Robin Chaney and had Pap 2010. Patient had Zostavax vaccine 2012  Colonoscopy by Dr. Ewing Chaney in 2008.  Patient is allergic to trees and pollen.  Patient is an Charity fundraiser at [employer]. Has a college degree. She is divorced. Does not smoke. Social alcohol consumption. No children.  Family history: Father died at 28 of lung cancer. Mother living with history of breast cancer. 2 brothers one of which has hypertension. One sister    Review of Systems  Constitutional: Negative.   Eyes: Negative.   Respiratory: Negative.   Cardiovascular: Negative.   Gastrointestinal: Negative.   Genitourinary: Negative.   Musculoskeletal: Negative.   Neurological: Positive for headaches.  Hematological: Negative.   Psychiatric/Behavioral: Negative.        Objective:   Physical Exam  Vitals reviewed. Constitutional: She is oriented to person, place, and time. She appears well-developed and well-nourished. No distress.  HENT:  Head: Normocephalic and atraumatic.  Right Ear: External ear normal.  Left Ear: External ear normal.  Mouth/Throat: Oropharynx is clear and moist. No oropharyngeal exudate.  Eyes: Conjunctivae normal and EOM are normal. Right eye exhibits no discharge. Left eye exhibits no discharge.  Neck: Neck supple. No JVD present. No thyromegaly present.  Cardiovascular: Normal rate, regular rhythm, normal  heart sounds and intact distal pulses.   No murmur heard. Pulmonary/Chest: Effort normal and breath sounds normal. She has no wheezes. She has no rales.       Breasts bilateral implants  Abdominal: Soft. Bowel sounds are normal. She exhibits no distension and no mass. There is no tenderness. There is no rebound and no guarding.  Genitourinary:       Pap per Dr. Vickey Chaney August 2013  Musculoskeletal: Normal range of motion. She exhibits no edema and no tenderness.  Lymphadenopathy:    She has no cervical adenopathy.  Neurological: She is alert and oriented to person, place, and time. She has normal reflexes. She displays normal reflexes. No cranial nerve deficit. Coordination normal.  Skin: Skin is warm and dry. She is not diaphoretic.  Psychiatric: She has a normal mood and affect. Her behavior is normal. Judgment and thought content normal.          Assessment & Plan:  Insomnia  Allergic rhinitis  Migraine headaches  History of back pain  Plan: Continue same medications and return in one year. Continue Ambien 10 mg at bedtime.

## 2012-08-17 ENCOUNTER — Other Ambulatory Visit: Payer: Self-pay | Admitting: Internal Medicine

## 2012-08-17 NOTE — Telephone Encounter (Signed)
Refill for 6 months please  

## 2012-11-26 ENCOUNTER — Ambulatory Visit (INDEPENDENT_AMBULATORY_CARE_PROVIDER_SITE_OTHER): Payer: 59 | Admitting: Internal Medicine

## 2012-11-26 ENCOUNTER — Encounter: Payer: Self-pay | Admitting: Internal Medicine

## 2012-11-26 VITALS — BP 126/78 | HR 72 | Temp 98.3°F | Wt 136.5 lb

## 2012-11-26 DIAGNOSIS — B9789 Other viral agents as the cause of diseases classified elsewhere: Secondary | ICD-10-CM

## 2012-11-26 DIAGNOSIS — R509 Fever, unspecified: Secondary | ICD-10-CM

## 2012-11-26 DIAGNOSIS — H6590 Unspecified nonsuppurative otitis media, unspecified ear: Secondary | ICD-10-CM

## 2012-11-26 DIAGNOSIS — B349 Viral infection, unspecified: Secondary | ICD-10-CM

## 2012-11-26 LAB — CBC WITH DIFFERENTIAL/PLATELET
Lymphocytes Relative: 16 % (ref 12–46)
Lymphs Abs: 1.4 10*3/uL (ref 0.7–4.0)
MCV: 90.8 fL (ref 78.0–100.0)
Neutrophils Relative %: 74 % (ref 43–77)
Platelets: 398 10*3/uL (ref 150–400)
RBC: 4.37 MIL/uL (ref 3.87–5.11)
WBC: 9 10*3/uL (ref 4.0–10.5)

## 2012-11-26 LAB — POCT URINALYSIS DIPSTICK
Glucose, UA: NEGATIVE
Nitrite, UA: NEGATIVE
Spec Grav, UA: 1.025
Urobilinogen, UA: NEGATIVE

## 2012-11-26 NOTE — Progress Notes (Addendum)
  Subjective:    Patient ID: Robin Chaney, female    DOB: [birth year], 62 y.o.   MRN: [id number]    HPI Pt has been out of work all week. Has had flu like symptoms with fever, chills, nausea and headache. Took influenza vaccine at work. Needs FMLA form completed. Has felt dizzy at times. No energy . Malaise and fatigue. No cough.    Review of Systems     Objective:   Physical Exam  Constitutional: She appears well-developed and well-nourished. No distress.  HENT:  Mouth/Throat: Oropharynx is clear and moist. No oropharyngeal exudate.  TMs full bilaterally but not red  Neck: No thyromegaly present.  Cardiovascular: Normal rate.   Pulmonary/Chest: Effort normal and breath sounds normal. No respiratory distress. She has no wheezes. She has no rales. She exhibits no tenderness.  Lymphadenopathy:    She has no cervical adenopathy.  Skin: Skin is warm and dry. She is not diaphoretic.          Assessment & Plan:  Bilateral serous otitis media- asymptomatic Viral syndrome  Out of work until 11/30/12 Treat serous otitis media with OTC decongestant. Has been taking Allegra. CBC with diff drawn. Urine is WNL.  11/27/2012: FMLA form completed and signed. Form was faxed to Wyman Songster at [facility].

## 2012-11-26 NOTE — Patient Instructions (Addendum)
OTC decongestant or antihistamine can be taken. Out of work until 11/30/12

## 2012-12-05 ENCOUNTER — Encounter: Payer: Self-pay | Admitting: Internal Medicine

## 2012-12-05 DIAGNOSIS — Z8669 Personal history of other diseases of the nervous system and sense organs: Secondary | ICD-10-CM | POA: Insufficient documentation

## 2013-02-08 ENCOUNTER — Other Ambulatory Visit: Payer: Self-pay

## 2013-02-08 MED ORDER — ZOLPIDEM TARTRATE 10 MG PO TABS: 10.0000 mg | ORAL_TABLET | Freq: Every evening | ORAL | Status: DC | PRN

## 2013-02-08 MED ORDER — OLOPATADINE HCL 0.1 % OP SOLN: 1.0000 [drp] | Freq: Two times a day (BID) | OPHTHALMIC | Status: DC

## 2013-05-03 ENCOUNTER — Other Ambulatory Visit: Payer: Self-pay | Admitting: Internal Medicine

## 2013-05-03 ENCOUNTER — Other Ambulatory Visit: Payer: Self-pay

## 2013-05-03 MED ORDER — ZOLPIDEM TARTRATE 10 MG PO TABS: 10.0000 mg | ORAL_TABLET | Freq: Every evening | ORAL | Status: DC | PRN

## 2013-05-03 NOTE — Telephone Encounter (Signed)
Refill through October. Make sure pt has CPE in October last PE was Oct 2013

## 2013-07-28 ENCOUNTER — Other Ambulatory Visit: Payer: Self-pay | Admitting: Internal Medicine

## 2013-07-28 NOTE — Telephone Encounter (Signed)
Pt past due for PE . Will only refill Ambien once until makes PE appt.

## 2013-07-28 NOTE — Telephone Encounter (Signed)
Spoke with Morrie Sheldon @ [facility].  Refill was given on 7/28 and patient notified to call our office for CPE.  Patient has not scheduled CPE.  Spoke with Dr. Lenord Fellers.  Verbal order to refuse refill on Ambien until patient contacts the office and schedules CPE.  Per Morrie Sheldon @ [facility], she'll call the patient and let her know.

## 2013-07-29 ENCOUNTER — Other Ambulatory Visit: Payer: Self-pay | Admitting: *Deleted

## 2013-07-29 ENCOUNTER — Telehealth: Payer: Self-pay | Admitting: *Deleted

## 2013-07-29 MED ORDER — ZOLPIDEM TARTRATE 10 MG PO TABS: 10.0000 mg | ORAL_TABLET | Freq: Every evening | ORAL | Status: DC | PRN

## 2013-07-29 NOTE — Telephone Encounter (Signed)
Opened in error

## 2013-09-10 ENCOUNTER — Other Ambulatory Visit: Payer: 59 | Admitting: Internal Medicine

## 2013-09-10 DIAGNOSIS — Z1329 Encounter for screening for other suspected endocrine disorder: Secondary | ICD-10-CM

## 2013-09-10 DIAGNOSIS — Z0000 Encounter for general adult medical examination without abnormal findings: Secondary | ICD-10-CM

## 2013-09-10 DIAGNOSIS — Z13220 Encounter for screening for lipoid disorders: Secondary | ICD-10-CM

## 2013-09-10 DIAGNOSIS — Z130 Encounter for screening for diseases of the blood and blood-forming organs and certain disorders involving the immune mechanism: Secondary | ICD-10-CM

## 2013-09-10 LAB — CBC WITH DIFFERENTIAL/PLATELET
Eosinophils Absolute: 0.1 10*3/uL (ref 0.0–0.7)
Hemoglobin: 14 g/dL (ref 12.0–15.0)
Lymphocytes Relative: 37 % (ref 12–46)
Lymphs Abs: 1.5 10*3/uL (ref 0.7–4.0)
MCH: 31 pg (ref 26.0–34.0)
MCV: 90.9 fL (ref 78.0–100.0)
Monocytes Relative: 11 % (ref 3–12)
Neutrophils Relative %: 49 % (ref 43–77)
RBC: 4.52 MIL/uL (ref 3.87–5.11)

## 2013-09-10 LAB — LIPID PANEL: Cholesterol: 164 mg/dL (ref 0–200)

## 2013-09-10 LAB — COMPREHENSIVE METABOLIC PANEL
ALT: <8 U/L (ref 0–35)
CO2: 26 mEq/L (ref 19–32)
Calcium: 8.6 mg/dL (ref 8.4–10.5)
Chloride: 105 mEq/L (ref 96–112)
Potassium: 4 mEq/L (ref 3.5–5.3)
Sodium: 138 mEq/L (ref 135–145)
Total Protein: 6.3 g/dL (ref 6.0–8.3)

## 2013-09-11 LAB — VITAMIN D 25 HYDROXY (VIT D DEFICIENCY, FRACTURES): Vit D, 25-Hydroxy: 30 ng/mL (ref 30–89)

## 2013-09-13 ENCOUNTER — Ambulatory Visit (INDEPENDENT_AMBULATORY_CARE_PROVIDER_SITE_OTHER): Payer: 59 | Admitting: Internal Medicine

## 2013-09-13 ENCOUNTER — Encounter: Payer: Self-pay | Admitting: Internal Medicine

## 2013-09-13 VITALS — BP 136/80 | HR 68 | Temp 97.5°F | Ht 63.5 in | Wt 140.0 lb

## 2013-09-13 DIAGNOSIS — J309 Allergic rhinitis, unspecified: Secondary | ICD-10-CM

## 2013-09-13 DIAGNOSIS — R7989 Other specified abnormal findings of blood chemistry: Secondary | ICD-10-CM

## 2013-09-13 DIAGNOSIS — G4700 Insomnia, unspecified: Secondary | ICD-10-CM

## 2013-09-13 DIAGNOSIS — Z8739 Personal history of other diseases of the musculoskeletal system and connective tissue: Secondary | ICD-10-CM

## 2013-09-13 DIAGNOSIS — Z8669 Personal history of other diseases of the nervous system and sense organs: Secondary | ICD-10-CM

## 2013-09-13 DIAGNOSIS — R946 Abnormal results of thyroid function studies: Secondary | ICD-10-CM

## 2013-09-13 NOTE — Patient Instructions (Addendum)
Return in 6 months for OV and TSH.

## 2013-09-13 NOTE — Progress Notes (Signed)
   Subjective:    Patient ID: Robin Chaney, female    DOB: [birth year], 62 y.o.   MRN: [id number]  HPI 62 year old female in today for health maintenance exam and evaluation of medical issues.  History of allergic rhinitis, insomnia, migraine headaches, back pain.  No known drug allergies  Past medical history: History of pneumonia as an adult and as a young child. Poison ivy June 2010. Patient has had breast implants twice. Had D&C at [facility] for menorrhagia.  Patient is allergic to trees and pollen.  Tetanus immunization done through employee health at [facility] health. Influenza immunization given through [facility]. Pneumovax immunization 2002. Patient had Zostavax vaccine in 2012.   GYN is Jacklynn Barnacle  Had colonoscopy by Dr. Ewing Schlein in 2008.  Social history: Patient is a Designer, jewellery at [employer]. She has a college degree. She is divorced. Does not smoke. Social alcohol consumption. No children.  Family history: Father died at age 104 of lung cancer. Mother died of an MI at age 78 with history of peripheral vascular disease and breast cancer. 2 brothers- one of which has hypertension. One sister in good health.    Review of Systems  Constitutional: Negative.   Allergic/Immunologic: Positive for environmental allergies.  Psychiatric/Behavioral:       Insomnia  All other systems reviewed and are negative.      Objective:   Physical Exam  Vitals reviewed. Constitutional: She is oriented to person, place, and time. She appears well-developed and well-nourished. No distress.  HENT:  Head: Normocephalic and atraumatic.  Right Ear: External ear normal.  Left Ear: External ear normal.  Mouth/Throat: Oropharynx is clear and moist. No oropharyngeal exudate.  Eyes: Conjunctivae and EOM are normal. Pupils are equal, round, and reactive to light. Right eye exhibits no discharge. Left eye exhibits no discharge. No scleral icterus.  Neck: Neck supple. No JVD  present. No thyromegaly present.  Cardiovascular: Normal rate, regular rhythm, normal heart sounds and intact distal pulses.   No murmur heard. Pulmonary/Chest: Effort normal and breath sounds normal. No respiratory distress. She has no wheezes. She has no rales. She exhibits no tenderness.  Bilateral breast implants  Abdominal: Soft. Bowel sounds are normal. She exhibits no distension and no mass. There is no tenderness. There is no rebound and no guarding.  Genitourinary:  Deferred to GYN  Musculoskeletal: She exhibits no edema.  Lymphadenopathy:    She has no cervical adenopathy.  Neurological: She is alert and oriented to person, place, and time. She has normal reflexes. No cranial nerve deficit. Coordination normal.  Skin: Skin is warm and dry. No rash noted. She is not diaphoretic.  Psychiatric: She has a normal mood and affect. Her behavior is normal. Judgment and thought content normal.          Assessment & Plan:  Allergic rhinitis  Insomnia  History of back pain  History of migraine headaches  Rising TSH-needs to be repeated in 6 months  Plan: Return in 6 months to repeat TSH and refill Ambien. Refill Ambien.

## 2013-10-20 ENCOUNTER — Other Ambulatory Visit: Payer: Self-pay | Admitting: Internal Medicine

## 2014-01-17 ENCOUNTER — Other Ambulatory Visit: Payer: Self-pay | Admitting: Internal Medicine

## 2014-02-16 ENCOUNTER — Other Ambulatory Visit: Payer: Self-pay | Admitting: Internal Medicine

## 2014-03-14 ENCOUNTER — Encounter: Payer: Self-pay | Admitting: Internal Medicine

## 2014-03-14 ENCOUNTER — Ambulatory Visit (INDEPENDENT_AMBULATORY_CARE_PROVIDER_SITE_OTHER): Payer: 59 | Admitting: Internal Medicine

## 2014-03-14 VITALS — BP 126/80 | HR 76 | Temp 98.4°F | Wt 140.0 lb

## 2014-03-14 DIAGNOSIS — G4700 Insomnia, unspecified: Secondary | ICD-10-CM

## 2014-03-14 DIAGNOSIS — Z1329 Encounter for screening for other suspected endocrine disorder: Secondary | ICD-10-CM

## 2014-03-14 LAB — TSH: TSH: 2.484 u[IU]/mL (ref 0.350–4.500)

## 2014-03-14 NOTE — Patient Instructions (Signed)
Okay to refill Ambien for 6 months. Physical examination due December 2015. TSH repeated today .

## 2014-03-14 NOTE — Progress Notes (Signed)
   Subjective:    Patient ID: Robin Chaney, female    DOB: [birth year], 63 y.o.   MRN: [id number]  HPI  Six-month recheck on insomnia. Takes Ambien because she has irregular work hours. Has difficulty sleeping sometimes. Has some medication left and doesn't need refilled today. She is a Marine scientist. Somedays she has to arise at 4 AM to be at work by 5:30. Other days, she goes in at 8 AM.  Also followup on TSH. In 2013 TSH was 2.465. In December 2014 it had risen to 3.889. She feels well. Says her nails a been a bit brittle and she started taking Biotin which helped.    Review of Systems     Objective:   Physical Exam  No thyromegaly      Assessment & Plan:  Insomnia  Monitor TSH  Plan: TSH repeated today. Return December 2015 for physical examination. Okay to refill Ambien until then. Review TSH result when back tomorrow and advise patient further.

## 2014-03-14 NOTE — Addendum Note (Signed)
Addended by: Brett Canales on: 03/14/2014 04:09 PM   Modules accepted: Orders

## 2014-03-16 NOTE — Progress Notes (Signed)
Patient informed. 

## 2014-05-09 ENCOUNTER — Encounter: Payer: Self-pay | Admitting: Internal Medicine

## 2014-05-09 ENCOUNTER — Ambulatory Visit (INDEPENDENT_AMBULATORY_CARE_PROVIDER_SITE_OTHER): Payer: 59 | Admitting: Internal Medicine

## 2014-05-09 VITALS — BP 124/72 | HR 76 | Temp 98.2°F | Wt 145.0 lb

## 2014-05-09 DIAGNOSIS — H00019 Hordeolum externum unspecified eye, unspecified eyelid: Secondary | ICD-10-CM

## 2014-05-09 DIAGNOSIS — J029 Acute pharyngitis, unspecified: Secondary | ICD-10-CM

## 2014-05-09 DIAGNOSIS — H6501 Acute serous otitis media, right ear: Secondary | ICD-10-CM

## 2014-05-09 DIAGNOSIS — J069 Acute upper respiratory infection, unspecified: Secondary | ICD-10-CM

## 2014-05-09 DIAGNOSIS — H00013 Hordeolum externum right eye, unspecified eyelid: Secondary | ICD-10-CM

## 2014-05-09 DIAGNOSIS — H6500 Acute serous otitis media, unspecified ear: Secondary | ICD-10-CM

## 2014-05-09 LAB — POCT RAPID STREP A (OFFICE): RAPID STREP A SCREEN: NEGATIVE

## 2014-05-09 MED ORDER — LEVOFLOXACIN 500 MG PO TABS: 500.0000 mg | ORAL_TABLET | Freq: Every day | ORAL | Status: DC

## 2014-05-09 MED ORDER — OFLOXACIN 0.3 % OP SOLN: 1.0000 [drp] | Freq: Four times a day (QID) | OPHTHALMIC | Status: DC

## 2014-05-09 NOTE — Patient Instructions (Addendum)
Use warm hot compresses right eyelid 20 minutes 4 times daily. Use Ocuflox ophthalmic drops in both eyes 2 drops 4 times a day for 5 days. Take Levaquin 500 milligrams daily for 10 days for ear infection and acute respiratory infection symptoms. Out of work until Wednesday, August 5.

## 2014-05-09 NOTE — Progress Notes (Signed)
   Subjective:    Patient ID: Robin Chaney, female    DOB: 1951/05/12, 63 y.o.   MRN: [id number]  HPI  Has had swelling of right upper eyelid for several days. It has been painful and will not go away despite using warm hot compresses. Eyelid was nearly swollen shut. Then developed acute URI symptoms with sore throat, maxillary sinus tenderness ,and right ear pain. Thinks initially she had purulent drainage from right eye. Is out of work today.    Review of Systems     Objective:   Physical Exam  Hordeolum right upper eyelid. Both conjunctivae are injected. Left TM is clear. Right TM is full but not red. Pharynx is red. Rapid strep screen is negative.      Assessment & Plan:  Hordeolum right upper eyelid  Acute URI  Acute right serous otitis media  Plan: Levaquin 500 milligrams daily for 10 days. Ocuflox ophthalmic solution 2 drops in each eye 4 times daily for 5 days. Warm hot compresses to right eyelid 20 minutes 4 times daily. Out of work until Wednesday August 5th.

## 2014-06-16 ENCOUNTER — Telehealth: Payer: Self-pay | Admitting: Internal Medicine

## 2014-06-16 MED ORDER — VALACYCLOVIR HCL 500 MG PO TABS: 500.0000 mg | ORAL_TABLET | Freq: Two times a day (BID) | ORAL | Status: DC

## 2014-06-16 NOTE — Telephone Encounter (Addendum)
RX Valtrex 500 mg bid x 5 days

## 2014-07-04 ENCOUNTER — Other Ambulatory Visit: Payer: Self-pay | Admitting: Internal Medicine

## 2014-07-04 NOTE — Telephone Encounter (Signed)
Ambien called into [facility].

## 2014-07-04 NOTE — Telephone Encounter (Signed)
Call in refill x 6 months

## 2014-07-12 ENCOUNTER — Telehealth: Payer: Self-pay | Admitting: Internal Medicine

## 2014-07-12 NOTE — Telephone Encounter (Signed)
[email or web address].Jennifer Castillo, CMA  

## 2014-07-13 NOTE — Telephone Encounter (Signed)
Called and spoke to pt. Pt states she was told she was not allergic to cats. Pt now has cat and is c/o red, raised, itchy rash that has present over a 1 month ago. Pt states the rash is on her hands, neck and chest, which is exactly where the cat has made contact with pt. Pt denies drainage/weeping from the rash. Pt taking allegra during the day and zyrtec at night and applying hydrocortisone cream with little relief. Pt last seen in 02/2011 by CY and was receiving allergy vaccine then, pt no longer getting allergy vaccine.   CY please advise.  Allergies  Allergen Reactions  . Codeine     REACTION: Intolerant-nausea    Current Outpatient Prescriptions on File Prior to Visit  Medication Sig Dispense Refill  . aspirin-acetaminophen-caffeine (EXCEDRIN MIGRAINE) 250-250-65 MG per tablet Take 1 tablet by mouth every 6 (six) hours as needed.        . cetirizine (ZYRTEC) 10 MG tablet Take 10 mg by mouth daily as needed.        Marland Kitchen estrogen, conjugated,-medroxyprogesterone (PREMPRO) 0.3-1.5 MG per tablet Take 1 tablet by mouth daily.      . fexofenadine (ALLEGRA) 180 MG tablet Take 180 mg by mouth daily as needed.        Marland Kitchen ibuprofen (ADVIL,MOTRIN) 200 MG tablet Take 200 mg by mouth every 6 (six) hours as needed.        Marland Kitchen levofloxacin (LEVAQUIN) 500 MG tablet Take 1 tablet (500 mg total) by mouth daily.  10 tablet  0  . loratadine (CLARITIN) 10 MG tablet Take 10 mg by mouth daily as needed.        Marland Kitchen ofloxacin (OCUFLOX) 0.3 % ophthalmic solution Place 1 drop into both eyes 4 (four) times daily.  5 mL  0  . PATANOL 0.1 % ophthalmic solution PLACE 1 DROP IN EACH EYE ONCE DAILY FOR ALLERGY SYMPTOMS  5 mL  PRN  . valACYclovir (VALTREX) 500 MG tablet Take 1 tablet (500 mg total) by mouth 2 (two) times daily.  5 tablet  1  . zolpidem (AMBIEN) 10 MG tablet TAKE 1 TABLET BY MOUTH AT BEDTIME  90 tablet  5   No current facility-administered medications on file prior to visit.

## 2014-07-13 NOTE — Telephone Encounter (Signed)
Called and spoke to pt. Informed pt of recs per CY. Pt verbalized understanding and denied any further questions or concerns at this time.

## 2014-07-13 NOTE — Telephone Encounter (Signed)
Pt is waiting to hear from [facility] for recs.  Pt states his has spread up her arms now.  Robin Chaney

## 2014-07-13 NOTE — Telephone Encounter (Signed)
Skin testing was negative for cat in 2011- 4 years ago. Sounds as if she has gotten sensitized now.  As a first suggestion, try checking at pet supply stores like [employer] for a product that the cat can be wiped with to suppress allergic reaction. I think it works partly by reducing shedding of skin particles and hair every where.  They usually advise trying to wash the cat once a week- good luck on that, unless it is a new kitten and can be trained to the routine. Wear a mask and gloves to change litter box Don't let the cat in the bedroom Wash hands immediately after handling the cat

## 2014-07-13 NOTE — Telephone Encounter (Signed)
Pt returned call & asks to be reached at [phone] or [phone].  Pt states that she gets off of work at 2:30 today if an appt is needed.     Satira Anis

## 2014-07-29 ENCOUNTER — Telehealth: Payer: Self-pay

## 2014-07-29 MED ORDER — TRETINOIN 0.1 % EX CREA: TOPICAL_CREAM | Freq: Every day | CUTANEOUS | Status: DC

## 2014-07-29 NOTE — Telephone Encounter (Signed)
Patient called requesting a refill on her tretinoin cream 0.1%.  Please advise.

## 2014-07-29 NOTE — Telephone Encounter (Signed)
Tretinoin cream sent to pharmacy.

## 2014-07-29 NOTE — Telephone Encounter (Signed)
Refill x one year °

## 2014-12-06 ENCOUNTER — Other Ambulatory Visit: Payer: Self-pay | Admitting: Internal Medicine

## 2014-12-06 NOTE — Telephone Encounter (Signed)
Last CPE 12/14. OK to refill until seen

## 2014-12-06 NOTE — Telephone Encounter (Signed)
Refills sent to pharmacy. 

## 2014-12-26 ENCOUNTER — Other Ambulatory Visit: Payer: 59 | Admitting: Internal Medicine

## 2014-12-26 DIAGNOSIS — Z130 Encounter for screening for diseases of the blood and blood-forming organs and certain disorders involving the immune mechanism: Secondary | ICD-10-CM

## 2014-12-26 DIAGNOSIS — Z1329 Encounter for screening for other suspected endocrine disorder: Secondary | ICD-10-CM

## 2014-12-26 DIAGNOSIS — Z13220 Encounter for screening for lipoid disorders: Secondary | ICD-10-CM

## 2014-12-26 DIAGNOSIS — Z0000 Encounter for general adult medical examination without abnormal findings: Secondary | ICD-10-CM

## 2014-12-26 DIAGNOSIS — Z1321 Encounter for screening for nutritional disorder: Secondary | ICD-10-CM

## 2014-12-26 LAB — COMPLETE METABOLIC PANEL WITH GFR
ALK PHOS: 75 U/L (ref 39–117)
ALT: 12 U/L (ref 0–35)
AST: 16 U/L (ref 0–37)
Albumin: 4.1 g/dL (ref 3.5–5.2)
BILIRUBIN TOTAL: 0.4 mg/dL (ref 0.2–1.2)
BUN: 19 mg/dL (ref 6–23)
CO2: 26 mEq/L (ref 19–32)
Calcium: 9 mg/dL (ref 8.4–10.5)
Chloride: 106 mEq/L (ref 96–112)
Creat: 0.57 mg/dL (ref 0.50–1.10)
GFR, Est African American: >89 mL/min
GFR, Est Non African American: >89 mL/min
Glucose, Bld: 99 mg/dL (ref 70–99)
Potassium: 5.1 mEq/L (ref 3.5–5.3)
SODIUM: 140 meq/L (ref 135–145)
TOTAL PROTEIN: 6.3 g/dL (ref 6.0–8.3)

## 2014-12-26 LAB — CBC WITH DIFFERENTIAL/PLATELET
BASOS PCT: 1 % (ref 0–1)
Basophils Absolute: 0 10*3/uL (ref 0.0–0.1)
EOS ABS: 0.1 10*3/uL (ref 0.0–0.7)
EOS PCT: 4 % (ref 0–5)
HCT: 42.9 % (ref 36.0–46.0)
Hemoglobin: 13.9 g/dL (ref 12.0–15.0)
Lymphocytes Relative: 41 % (ref 12–46)
Lymphs Abs: 1.5 10*3/uL (ref 0.7–4.0)
MCH: 29.4 pg (ref 26.0–34.0)
MCHC: 32.4 g/dL (ref 30.0–36.0)
MCV: 90.9 fL (ref 78.0–100.0)
MONO ABS: 0.4 10*3/uL (ref 0.1–1.0)
MPV: 9.9 fL (ref 8.6–12.4)
Monocytes Relative: 12 % (ref 3–12)
Neutro Abs: 1.6 10*3/uL — ABNORMAL LOW (ref 1.7–7.7)
Neutrophils Relative %: 42 % — ABNORMAL LOW (ref 43–77)
Platelets: 369 10*3/uL (ref 150–400)
RBC: 4.72 MIL/uL (ref 3.87–5.11)
RDW: 14 % (ref 11.5–15.5)
WBC: 3.7 10*3/uL — ABNORMAL LOW (ref 4.0–10.5)

## 2014-12-26 LAB — LIPID PANEL
CHOL/HDL RATIO: 3.9 ratio
CHOLESTEROL: 198 mg/dL (ref 0–200)
HDL: 51 mg/dL (ref >=46)
LDL Cholesterol: 106 mg/dL — ABNORMAL HIGH (ref 0–99)
Triglycerides: 207 mg/dL — ABNORMAL HIGH (ref <150)
VLDL: 41 mg/dL — ABNORMAL HIGH (ref 0–40)

## 2014-12-26 LAB — TSH: TSH: 2.343 u[IU]/mL (ref 0.350–4.500)

## 2014-12-27 LAB — VITAMIN D 25 HYDROXY (VIT D DEFICIENCY, FRACTURES): VIT D 25 HYDROXY: 16 ng/mL — AB (ref 30–100)

## 2015-01-02 ENCOUNTER — Encounter: Payer: Self-pay | Admitting: Internal Medicine

## 2015-01-02 ENCOUNTER — Ambulatory Visit (INDEPENDENT_AMBULATORY_CARE_PROVIDER_SITE_OTHER): Payer: 59 | Admitting: Internal Medicine

## 2015-01-02 VITALS — BP 120/72 | HR 76 | Temp 98.0°F | Ht 64.0 in | Wt 148.0 lb

## 2015-01-02 DIAGNOSIS — E781 Pure hyperglyceridemia: Secondary | ICD-10-CM | POA: Diagnosis not present

## 2015-01-02 DIAGNOSIS — G4700 Insomnia, unspecified: Secondary | ICD-10-CM

## 2015-01-02 DIAGNOSIS — Z0000 Encounter for general adult medical examination without abnormal findings: Secondary | ICD-10-CM | POA: Diagnosis not present

## 2015-01-02 LAB — POCT URINALYSIS DIPSTICK
Bilirubin, UA: NEGATIVE
Blood, UA: NEGATIVE
Glucose, UA: NEGATIVE
Ketones, UA: NEGATIVE
LEUKOCYTES UA: NEGATIVE
NITRITE UA: NEGATIVE
PROTEIN UA: NEGATIVE
Spec Grav, UA: 1.01
UROBILINOGEN UA: NEGATIVE
pH, UA: 6

## 2015-01-02 MED ORDER — ZOLPIDEM TARTRATE 10 MG PO TABS: 10.0000 mg | ORAL_TABLET | Freq: Every evening | ORAL | Status: DC | PRN

## 2015-01-02 NOTE — Progress Notes (Signed)
   Subjective:    Patient ID: Robin Chaney, female    DOB: January 21, 1951, 64 y.o.   MRN: [id number]  HPI 64 year old  White Female for health maintenance exam and evaluation of medical issues. Patient says that she has gained weight and she's displeased with her weight. Weight today is 148 pounds and was 140 pounds December 2014. She has a history of allergic rhinitis, insomnia, migraine headaches and occasional back pain.  No known drug allergies  Past medical history: History of pneumonia as an adult and his young child. History of poison ivy June 2010. She has had breast implants twice. Had D&C at [facility] for menorrhagia.  Patient is allergic to trees and pollen.  Tetanus immunization done through employee health at [facility]. Flu vaccine given through employment. Pneumovax immunization 2002. Had Zostavax vaccine in 2012.  GYN is Gretchen Atkins,MD  Colonoscopy by Dr. Watt Climes 2008.  Social history: She is a Equities trader at [employer]. She has a college degree. She is divorced. Does not smoke. Social alcohol consumption. No children.  Family history: Father died at age 41 of lung cancer. Mother died at age 30 of an MI with history of peripheral vascular disease and breast cancer. 2 brothers one of whom has hypertension. One sister in good health.    Review of Systems  Constitutional: Negative.   HENT: Negative.   Respiratory: Negative.   Cardiovascular: Negative.   Gastrointestinal: Negative.   Endocrine: Negative.   Allergic/Immunologic: Positive for environmental allergies.  Neurological:       History of migraine headaches  Psychiatric/Behavioral:       History of insomnia       Objective:   Physical Exam  Constitutional: She is oriented to person, place, and time. She appears well-developed and well-nourished. No distress.  HENT:  Head: Normocephalic and atraumatic.  Right Ear: External ear normal.  Left Ear: External ear normal.  Mouth/Throat:  Oropharynx is clear and moist.  Eyes: Conjunctivae and EOM are normal. Pupils are equal, round, and reactive to light. Right eye exhibits no discharge. Left eye exhibits no discharge. No scleral icterus.  Neck: Neck supple. No JVD present. No thyromegaly present.  Cardiovascular: Normal rate, regular rhythm and normal heart sounds.   No murmur heard. Pulmonary/Chest: Effort normal and breath sounds normal. No respiratory distress. She has no wheezes. She has no rales.  Breasts normal female. Implants noted.  Abdominal: Soft. Bowel sounds are normal. She exhibits no distension and no mass. There is no tenderness. There is no rebound and no guarding.  Genitourinary:  Deferred to GYN  Lymphadenopathy:    She has no cervical adenopathy.  Neurological: She is alert and oriented to person, place, and time. She has normal reflexes. She displays normal reflexes. No cranial nerve deficit. Coordination normal.  Skin: Skin is warm and dry. No rash noted. She is not diaphoretic.  Psychiatric: She has a normal mood and affect. Her behavior is normal. Judgment and thought content normal.  Vitals reviewed.         Assessment & Plan:  Insomnia  8 pound  weight gain-TSH is normal  Allergic rhinitis  History of migraine headaches  Hypertriglyceridemia-triglycerides are now 207 or previously normal. Likely will respond to diet and exercise.  Plan: Refill Ambien for 6 months. Return in 6 months if desired to have lipid panel rechecked otherwise return in one year.

## 2015-01-04 NOTE — Patient Instructions (Signed)
Watch diet and restart her exercise regimen. Return in one year or as needed. You may have lipids checked in 6 months if you so desire. Ambien refilled.

## 2015-03-30 ENCOUNTER — Other Ambulatory Visit: Payer: Self-pay | Admitting: Internal Medicine

## 2015-06-09 ENCOUNTER — Encounter: Payer: Self-pay | Admitting: Internal Medicine

## 2015-06-09 ENCOUNTER — Ambulatory Visit (INDEPENDENT_AMBULATORY_CARE_PROVIDER_SITE_OTHER): Payer: 59 | Admitting: Internal Medicine

## 2015-06-09 VITALS — BP 110/74 | HR 70 | Temp 97.9°F | Wt 147.0 lb

## 2015-06-09 DIAGNOSIS — L259 Unspecified contact dermatitis, unspecified cause: Secondary | ICD-10-CM | POA: Diagnosis not present

## 2015-06-09 DIAGNOSIS — T148 Other injury of unspecified body region: Secondary | ICD-10-CM

## 2015-06-09 DIAGNOSIS — W57XXXA Bitten or stung by nonvenomous insect and other nonvenomous arthropods, initial encounter: Secondary | ICD-10-CM

## 2015-06-09 MED ORDER — PREDNISONE 10 MG PO TABS
ORAL_TABLET | ORAL | Status: DC
Start: 2015-06-09 — End: 2015-10-12

## 2015-06-09 NOTE — Patient Instructions (Signed)
Take prednisone in tapering course as directed. 

## 2015-06-09 NOTE — Progress Notes (Signed)
   Subjective:    Patient ID: Robin Chaney, female    DOB: 10-Jun-1951, 64 y.o.   MRN: [id number]  HPI Patient here today regarding contact dermatitis. Thinks animals are bringing in poison ivy. Doesn't really do much yardwork. Has noticed a rash on her left anterior ankle and foot as well as right arm. It is itchy. She is coughing just a bit today. She went to a country music concert in [place] last night. No fever or sore throat.    Review of Systems     Objective:   Physical Exam  Chest clear to auscultation. She has linear erythematous raised lesions right arm that are consistent with poison ivy. Has a couple of areas on left foot that look to be insect bites and an erythematous macular papular rash on left ankle.      Assessment & Plan:  Contact dermatitis  Insect bites  Plan: Sterapred DS 10 mg 6 day dosepak take as directed in tapering course.

## 2015-06-19 ENCOUNTER — Other Ambulatory Visit: Payer: Self-pay | Admitting: Internal Medicine

## 2015-06-19 NOTE — Telephone Encounter (Signed)
Refill x 6 months 

## 2015-07-06 ENCOUNTER — Other Ambulatory Visit: Payer: Self-pay | Admitting: Obstetrics and Gynecology

## 2015-07-10 LAB — CYTOLOGY - PAP

## 2015-08-23 ENCOUNTER — Encounter: Payer: Self-pay | Admitting: Internal Medicine

## 2015-10-12 ENCOUNTER — Ambulatory Visit (INDEPENDENT_AMBULATORY_CARE_PROVIDER_SITE_OTHER): Payer: 59 | Admitting: Internal Medicine

## 2015-10-12 ENCOUNTER — Encounter: Payer: Self-pay | Admitting: Internal Medicine

## 2015-10-12 ENCOUNTER — Other Ambulatory Visit: Payer: Self-pay | Admitting: Internal Medicine

## 2015-10-12 DIAGNOSIS — M5431 Sciatica, right side: Secondary | ICD-10-CM | POA: Diagnosis not present

## 2015-10-12 MED ORDER — PREDNISONE 10 MG PO TABS
ORAL_TABLET | ORAL | Status: DC
Start: 2015-10-12 — End: 2016-03-30

## 2015-10-12 MED FILL — predniSONE 10 MG (21) TBPK: 10 | 6 days supply | Qty: 21 | Fill #0

## 2015-10-12 MED FILL — OLOPATADINE HCL 0.1% EYE DR: 0.1 | 30 days supply | Qty: 5 | Fill #0

## 2015-10-12 NOTE — Patient Instructions (Signed)
Take prednisone  In tapering course as directed for sciatica.

## 2015-10-12 NOTE — Progress Notes (Signed)
   Subjective:    Patient ID: Robin Chaney, female    DOB: 10/23/1950, 65 y.o.   MRN: [id number]  HPI Several day history of right low back pain radiating into right leg. She's had this issue before. Tends to be in the lumbosacral area. 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 Muscle strength is normal in the right lower extremity.      Assessment & Plan:  Right sciatica  Plan: Sterapred DS 10 mg 6 day dosepak with 1 refill. Doesn't want to take Flexeril or narcotic pain medication.

## 2015-11-09 ENCOUNTER — Other Ambulatory Visit: Payer: Self-pay | Admitting: Internal Medicine

## 2015-11-09 MED FILL — DIVIGEL 0.5 MG GEL PACKET: 0.5 | 30 days supply | Qty: 30 | Fill #1

## 2015-11-09 MED FILL — ESCITALOPRAM 10 MG TABLET: 10 | 90 days supply | Qty: 90 | Fill #0

## 2015-12-13 ENCOUNTER — Other Ambulatory Visit: Payer: Self-pay | Admitting: Internal Medicine

## 2015-12-13 NOTE — Telephone Encounter (Signed)
Refill Ambien x 6 months and Retin A x one year

## 2015-12-13 NOTE — Telephone Encounter (Signed)
Phoned to pharmacy 

## 2015-12-14 MED FILL — TRETINOIN 0.1% CREAM: 0.1 | 30 days supply | Qty: 20 | Fill #0

## 2015-12-19 MED FILL — ZOLPIDEM TARTRATE 10 MG TAB: 10 | 90 days supply | Qty: 90 | Fill #0

## 2016-01-30 MED FILL — OLOPATADINE HCL 0.1% EYE DR: 0.1 | 30 days supply | Qty: 5 | Fill #1

## 2016-01-31 MED FILL — PROGESTERONE 100 MG CAPSULE: 100 | 30 days supply | Qty: 30 | Fill #2

## 2016-03-12 MED FILL — ZOLPIDEM TARTRATE 10 MG TAB: 10 | 90 days supply | Qty: 90 | Fill #1

## 2016-03-13 ENCOUNTER — Telehealth: Payer: Self-pay | Admitting: Internal Medicine

## 2016-03-13 MED ORDER — VALACYCLOVIR HCL 500 MG PO TABS: 500.0000 mg | ORAL_TABLET | Freq: Two times a day (BID) | ORAL | Status: DC

## 2016-03-13 MED FILL — VALACYCLOVIR HCL 500 MG TAB: 500 | 5 days supply | Qty: 10 | Fill #0

## 2016-03-13 NOTE — Telephone Encounter (Signed)
Wants to know if you will call something in for her for fever blisters.  She hasn't had CPE since 12/2014.   Pharmacy:  [facility]

## 2016-03-13 NOTE — Telephone Encounter (Signed)
Verbal order per Dr. Renold Genta; Valtrex 500mg  bid x 5 days.  #10, no refills.  [facility] sent to [place] e-scribe.    Spoke with patient and advised that Rx had been e-scribed.  Patient is due for CPE; last CPE was March, 2016.  Patient advised that she must have CPE.  Patient states her calendar is at work.  She will call tomorrow once she returns to the hospital and has her calendar with her and book her appointment.  Advised that we are booking in August at the present.  So, we need to book for fasting labs as well as physical exam.

## 2016-03-18 MED FILL — predniSONE 10 MG TABS: 10 | 6 days supply | Qty: 21 | Fill #0

## 2016-03-27 DIAGNOSIS — N951 Menopausal and female climacteric states: Secondary | ICD-10-CM | POA: Diagnosis not present

## 2016-03-27 DIAGNOSIS — N760 Acute vaginitis: Secondary | ICD-10-CM | POA: Diagnosis not present

## 2016-03-27 MED FILL — valACYclovir HCL 1 GM TABS: 1 | 15 days supply | Qty: 30 | Fill #0

## 2016-03-27 MED FILL — PROGESTERONE 100 MG CAPSULE: 100 | 90 days supply | Qty: 90 | Fill #0

## 2016-03-27 MED FILL — ESTRADIOL 0.1 MG PATCH: 0.1 | 84 days supply | Qty: 24 | Fill #0

## 2016-03-30 ENCOUNTER — Ambulatory Visit (INDEPENDENT_AMBULATORY_CARE_PROVIDER_SITE_OTHER): Payer: 59 | Admitting: Family Medicine

## 2016-03-30 VITALS — BP 142/84 | HR 80 | Temp 98.2°F | Resp 17 | Ht 64.0 in | Wt 150.0 lb

## 2016-03-30 DIAGNOSIS — R300 Dysuria: Secondary | ICD-10-CM

## 2016-03-30 DIAGNOSIS — N3090 Cystitis, unspecified without hematuria: Secondary | ICD-10-CM

## 2016-03-30 LAB — POCT URINALYSIS DIP (MANUAL ENTRY)
BILIRUBIN UA: NEGATIVE
BILIRUBIN UA: NEGATIVE
GLUCOSE UA: NEGATIVE
Nitrite, UA: POSITIVE — AB
Protein Ur, POC: 100 — AB
Spec Grav, UA: >=1.03
Urobilinogen, UA: 0.2
pH, UA: 6

## 2016-03-30 LAB — POC MICROSCOPIC URINALYSIS (UMFC)

## 2016-03-30 MED ORDER — SULFAMETHOXAZOLE-TRIMETHOPRIM 800-160 MG PO TABS
1.0000 | ORAL_TABLET | Freq: Two times a day (BID) | ORAL | Status: DC
Start: 2016-03-30 — End: 2016-04-04

## 2016-03-30 NOTE — Patient Instructions (Addendum)
   IF you received an x-ray today, you will receive an invoice from [facility]. Please contact [facility] at [phone] with questions or concerns regarding your invoice.   IF you received labwork today, you will receive an invoice from [facility]. Please contact [facility] at [phone] with questions or concerns regarding your invoice.   Our billing staff will not be able to assist you with questions regarding bills from these companies.  You will be contacted with the lab results as soon as they are available. The fastest way to get your results is to activate your My Chart account. Instructions are located on the last page of this paperwork. If you have not heard from us regarding the results in 2 weeks, please contact this office.     Urinary Tract Infection Urinary tract infections (UTIs) can develop anywhere along your urinary tract. Your urinary tract is your body's drainage system for removing wastes and extra water. Your urinary tract includes two kidneys, two ureters, a bladder, and a urethra. Your kidneys are a pair of bean-shaped organs. Each kidney is about the size of your fist. They are located below your ribs, one on each side of your spine. CAUSES Infections are caused by microbes, which are microscopic organisms, including fungi, viruses, and bacteria. These organisms are so small that they can only be seen through a microscope. Bacteria are the microbes that most commonly cause UTIs. SYMPTOMS  Symptoms of UTIs may vary by age and gender of the patient and by the location of the infection. Symptoms in young women typically include a frequent and intense urge to urinate and a painful, burning feeling in the bladder or urethra during urination. Older women and men are more likely to be tired, shaky, and weak and have muscle aches and abdominal pain. A fever may mean the infection is in your kidneys. Other symptoms of a kidney  infection include pain in your back or sides below the ribs, nausea, and vomiting. DIAGNOSIS To diagnose a UTI, your caregiver will ask you about your symptoms. Your caregiver will also ask you to provide a urine sample. The urine sample will be tested for bacteria and white blood cells. White blood cells are made by your body to help fight infection. TREATMENT  Typically, UTIs can be treated with medication. Because most UTIs are caused by a bacterial infection, they usually can be treated with the use of antibiotics. The choice of antibiotic and length of treatment depend on your symptoms and the type of bacteria causing your infection. HOME CARE INSTRUCTIONS  If you were prescribed antibiotics, take them exactly as your caregiver instructs you. Finish the medication even if you feel better after you have only taken some of the medication.  Drink enough water and fluids to keep your urine clear or pale yellow.  Avoid caffeine, tea, and carbonated beverages. They tend to irritate your bladder.  Empty your bladder often. Avoid holding urine for long periods of time.  Empty your bladder before and after sexual intercourse.  After a bowel movement, women should cleanse from front to back. Use each tissue only once. SEEK MEDICAL CARE IF:   You have back pain.  You develop a fever.  Your symptoms do not begin to resolve within 3 days. SEEK IMMEDIATE MEDICAL CARE IF:   You have severe back pain or lower abdominal pain.  You develop chills.  You have nausea or vomiting.  You have continued burning or discomfort with urination.   MAKE SURE YOU:   Understand these instructions.  Will watch your condition.  Will get help right away if you are not doing well or get worse.   This information is not intended to replace advice given to you by your health care provider. Make sure you discuss any questions you have with your health care provider.   Document Released: 07/03/2005 Document  Revised: 06/14/2015 Document Reviewed: 11/01/2011 Elsevier Interactive Patient Education 2016 [employer]  

## 2016-03-30 NOTE — Progress Notes (Signed)
Subjective:    Patient ID: Robin Chaney, female    DOB: [birth year], 65 y.o.   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 She presents today with burning with urination, itching, urinary frequency, pelvic pressure. No fever/chills, no back pain, no nausea or vomiting. Has had cystitis in past, none for several years. Has been changing hormone preparations with PCP, has gone from vaginal to patch. Has recently been using coconut oil for vaginal dryness. Has used septra ds in past with good results.   Past Medical History  Diagnosis Date  . Allergic rhinitis   . Allergic conjunctivitis    No past surgical history on file. Family History  Problem Relation Age of Onset  . Emphysema      GF  . Allergies Mother   . Allergies Sister   . Asthma Mother   . Rheum arthritis Mother   . Breast cancer Mother   . Lung cancer Father     was a smoker   Social History  Substance Use Topics  . Smoking status: Never Smoker   . Smokeless tobacco: Never Used  . Alcohol Use: Yes     Comment: on weekends      Review of Systems Per HPI    Objective:   Physical Exam  Constitutional: She appears well-developed and well-nourished.  HENT:  Head: Normocephalic and atraumatic.  Eyes: Conjunctivae are normal.  Neck: Normal range of motion. Neck supple.  Cardiovascular: Normal rate, regular rhythm and normal heart sounds.   Pulmonary/Chest: Effort normal and breath sounds normal.  Abdominal: Soft. There is tenderness (suprapubic tenderness.). There is no CVA tenderness.  Vitals reviewed.     BP 142/84 mmHg  Pulse 80  Temp(Src) 98.2 F (36.8 C) (Oral)  Resp 17  Ht 5\' 4"  (1.626 m)  Wt 150 lb (68.04 kg)  BMI 25.73 kg/m2  SpO2 97% Wt Readings from Last 3 Encounters:  03/30/16 150 lb (68.04 kg)  06/09/15 147 lb (66.679 kg)  01/02/15 148 lb (67.132 kg)   Results for orders placed or performed in visit on 03/30/16  POCT Microscopic Urinalysis  ([facility])  Result Value Ref Range   WBC,UR,HPF,POC Too numerous to count  (A) None WBC/hpf   RBC,UR,HPF,POC Many (A) None RBC/hpf   Bacteria Moderate (A) None, Too numerous to count   Mucus Present (A) Absent   Epithelial Cells, UR Per Microscopy Few (A) None, Too numerous to count cells/hpf  POCT urinalysis dipstick  Result Value Ref Range   Color, UA yellow yellow   Clarity, UA hazy (A) clear   Glucose, UA negative negative   Bilirubin, UA negative negative   Ketones, POC UA negative negative   Spec Grav, UA >=1.030    Blood, UA moderate (A) negative   pH, UA 6.0    Protein Ur, POC =100 (A) negative   Urobilinogen, UA 0.2    Nitrite, UA Positive (A) Negative   Leukocytes, UA moderate (2+) (A) Negative        Assessment & Plan:  1. Dysuria - POCT Microscopic Urinalysis ([facility]) - POCT urinalysis dipstick  2. Cystitis - Provided written and verbal information regarding diagnosis and treatment. - RTC instructions provided - sulfamethoxazole-trimethoprim (BACTRIM DS,SEPTRA DS) 800-160 MG tablet; Take 1 tablet by mouth 2 (two) times daily.  Dispense: 10 tablet; Refill: 0 - Urine culture   Clarene Reamer, FNP-BC  [facility]  03/30/2016 11:46 AM

## 2016-04-01 LAB — URINE CULTURE: Colony Count: >=100000

## 2016-04-02 ENCOUNTER — Telehealth: Payer: Self-pay | Admitting: Emergency Medicine

## 2016-04-02 NOTE — Telephone Encounter (Signed)
-----   Message from Elby Beck, [place] sent at 04/01/2016  6:41 PM EDT ----- Please call patient and let her know that the antibiotic she was prescribed should be effective against the bacteria in her bladder according to the culture. If she isn't better, please come back in or go to PCP.

## 2016-04-02 NOTE — Telephone Encounter (Signed)
Pt given test results with instructions to return to clinic if sx's worsens or f/u with PCP

## 2016-04-04 ENCOUNTER — Ambulatory Visit (INDEPENDENT_AMBULATORY_CARE_PROVIDER_SITE_OTHER): Payer: 59 | Admitting: Family Medicine

## 2016-04-04 VITALS — BP 124/78 | HR 71 | Temp 98.2°F | Resp 17 | Ht 65.0 in | Wt 151.0 lb

## 2016-04-04 DIAGNOSIS — R300 Dysuria: Secondary | ICD-10-CM

## 2016-04-04 LAB — POCT URINALYSIS DIP (MANUAL ENTRY)
Bilirubin, UA: NEGATIVE
Glucose, UA: NEGATIVE
LEUKOCYTES UA: NEGATIVE
NITRITE UA: NEGATIVE
PH UA: 5.5
Spec Grav, UA: 1.025
UROBILINOGEN UA: 0.2

## 2016-04-04 LAB — POC MICROSCOPIC URINALYSIS (UMFC): Mucus: ABSENT

## 2016-04-04 MED ORDER — CEPHALEXIN 500 MG PO CAPS: 500.0000 mg | ORAL_CAPSULE | Freq: Three times a day (TID) | ORAL | Status: DC

## 2016-04-04 MED FILL — CEPHALEXIN 500 MG CAPSULE: 500 | 10 days supply | Qty: 30 | Fill #0

## 2016-04-04 NOTE — Patient Instructions (Signed)
     IF you received an x-ray today, you will receive an invoice from [facility]. Please contact [facility] at [phone] with questions or concerns regarding your invoice.   IF you received labwork today, you will receive an invoice from [facility]. Please contact [facility] at [phone] with questions or concerns regarding your invoice.   Our billing staff will not be able to assist you with questions regarding bills from these companies.  You will be contacted with the lab results as soon as they are available. The fastest way to get your results is to activate your My Chart account. Instructions are located on the last page of this paperwork. If you have not heard from us regarding the results in 2 weeks, please contact this office.      

## 2016-04-04 NOTE — Progress Notes (Signed)
Subjective:    Patient ID: Robin Chaney, female    DOB: 07/16/1951, 65 y.o.   MRN: [id number]  HPI This is a pleasant 65 yo female who presents today for follow up of recent cystitis.She was seen 5 days ago and given a prescription for Septra DS. She completed 5 days. Most of her symptoms have resolved but she continues to have sensation of having to urinate and some lower abdominal pressure. She does not think her course of antibiotic therapy was long enough and she would like another round of antibiotics. No fever, no abdominal pain, no nausea or vomiting. Felt poorly yesterday at work.   Past Medical History  Diagnosis Date  . Allergic rhinitis   . Allergic conjunctivitis   No past surgical history on file. Family History  Problem Relation Age of Onset  . Emphysema      GF  . Allergies Mother   . Allergies Sister   . Asthma Mother   . Rheum arthritis Mother   . Breast cancer Mother   . Lung cancer Father     was a smoker   Social History  Substance Use Topics  . Smoking status: Never Smoker   . Smokeless tobacco: Never Used  . Alcohol Use: Yes     Comment: on weekends    Review of Systems Per HPI    Objective:   Physical Exam Physical Exam  Constitutional: Oriented to person, place, and time. She appears well-developed and well-nourished.  HENT:  Head: Normocephalic and atraumatic.  Eyes: Conjunctivae are normal.  Neck: Normal range of motion. Neck supple.  Cardiovascular: Normal rate, regular rhythm and normal heart sounds.   Pulmonary/Chest: Effort normal and breath sounds normal.  Musculoskeletal: Normal range of motion.  Neurological: Alert and oriented to person, place, and time.  Skin: Skin is warm and dry.  Psychiatric: Normal mood and affect. Behavior is normal. Judgment and thought content normal.  Vitals reviewed.     BP 124/78 mmHg  Pulse 71  Temp(Src) 98.2 F (36.8 C) (Oral)  Resp 17  Ht 5\' 5"  (1.651 m)  Wt 151 lb (68.493 kg)  BMI  25.13 kg/m2  SpO2 97% Wt Readings from Last 3 Encounters:  04/04/16 151 lb (68.493 kg)  03/30/16 150 lb (68.04 kg)  06/09/15 147 lb (66.679 kg)   Results for orders placed or performed in visit on 04/04/16  POCT urinalysis dipstick  Result Value Ref Range   Color, UA yellow yellow   Clarity, UA cloudy (A) clear   Glucose, UA negative negative   Bilirubin, UA negative negative   Ketones, POC UA trace (5) (A) negative   Spec Grav, UA 1.025    Blood, UA trace-lysed (A) negative   pH, UA 5.5    Protein Ur, POC trace (A) negative   Urobilinogen, UA 0.2    Nitrite, UA Negative Negative   Leukocytes, UA Negative Negative  POCT Microscopic Urinalysis ([facility])  Result Value Ref Range   WBC,UR,HPF,POC None None WBC/hpf   RBC,UR,HPF,POC None None RBC/hpf   Bacteria None None, Too numerous to count   Mucus Absent Absent   Epithelial Cells, UR Per Microscopy Few (A) None, Too numerous to count cells/hpf       Assessment & Plan:  1. Dysuria - discussed lab results with patient, she wishes to retreat with different antibiotic - encouraged her to increase fluids as urine was concentrated today - keflex 500 mg po TID x 10 days - POCT urinalysis dipstick -  POCT Microscopic Urinalysis ([facility])   Clarene Reamer, FNP-BC  [facility]  04/04/2016 10:44 AM

## 2016-05-17 ENCOUNTER — Other Ambulatory Visit: Payer: 59 | Admitting: Internal Medicine

## 2016-05-17 DIAGNOSIS — J309 Allergic rhinitis, unspecified: Secondary | ICD-10-CM | POA: Diagnosis not present

## 2016-05-17 DIAGNOSIS — G4700 Insomnia, unspecified: Secondary | ICD-10-CM | POA: Diagnosis not present

## 2016-05-17 DIAGNOSIS — Z0000 Encounter for general adult medical examination without abnormal findings: Secondary | ICD-10-CM | POA: Diagnosis not present

## 2016-05-17 LAB — COMPLETE METABOLIC PANEL WITH GFR
ALT: 9 U/L (ref 6–29)
AST: 13 U/L (ref 10–35)
Albumin: 3.8 g/dL (ref 3.6–5.1)
Alkaline Phosphatase: 61 U/L (ref 33–130)
BILIRUBIN TOTAL: 0.4 mg/dL (ref 0.2–1.2)
BUN: 18 mg/dL (ref 7–25)
CHLORIDE: 106 mmol/L (ref 98–110)
CO2: 20 mmol/L (ref 20–31)
Calcium: 8.4 mg/dL — ABNORMAL LOW (ref 8.6–10.4)
Creat: 0.55 mg/dL (ref 0.50–0.99)
Glucose, Bld: 95 mg/dL (ref 65–99)
Potassium: 4.1 mmol/L (ref 3.5–5.3)
Sodium: 138 mmol/L (ref 135–146)
Total Protein: 5.9 g/dL — ABNORMAL LOW (ref 6.1–8.1)

## 2016-05-17 LAB — CBC WITH DIFFERENTIAL/PLATELET
BASOS ABS: 66 {cells}/uL (ref 0–200)
Basophils Relative: 1 %
EOS PCT: 1 %
Eosinophils Absolute: 66 cells/uL (ref 15–500)
HCT: 40.7 % (ref 35.0–45.0)
Hemoglobin: 13.5 g/dL (ref 11.7–15.5)
Lymphocytes Relative: 26 %
Lymphs Abs: 1716 cells/uL (ref 850–3900)
MCH: 30.5 pg (ref 27.0–33.0)
MCHC: 33.2 g/dL (ref 32.0–36.0)
MCV: 92.1 fL (ref 80.0–100.0)
MONOS PCT: 8 %
MPV: 9.6 fL (ref 7.5–12.5)
Monocytes Absolute: 528 cells/uL (ref 200–950)
NEUTROS ABS: 4224 {cells}/uL (ref 1500–7800)
NEUTROS PCT: 64 %
PLATELETS: 392 10*3/uL (ref 140–400)
RBC: 4.42 MIL/uL (ref 3.80–5.10)
RDW: 14.3 % (ref 11.0–15.0)
WBC: 6.6 10*3/uL (ref 3.8–10.8)

## 2016-05-17 LAB — LIPID PANEL
Cholesterol: 167 mg/dL (ref 125–200)
HDL: 66 mg/dL (ref >=46)
LDL CALC: 82 mg/dL (ref <130)
TRIGLYCERIDES: 97 mg/dL (ref <150)
Total CHOL/HDL Ratio: 2.5 Ratio (ref <=5.0)
VLDL: 19 mg/dL (ref <30)

## 2016-05-17 LAB — TSH: TSH: 3.05 mIU/L

## 2016-05-18 LAB — VITAMIN D 25 HYDROXY (VIT D DEFICIENCY, FRACTURES): Vit D, 25-Hydroxy: 25 ng/mL — ABNORMAL LOW (ref 30–100)

## 2016-05-21 ENCOUNTER — Encounter: Payer: Self-pay | Admitting: Internal Medicine

## 2016-05-21 ENCOUNTER — Ambulatory Visit (INDEPENDENT_AMBULATORY_CARE_PROVIDER_SITE_OTHER): Payer: 59 | Admitting: Internal Medicine

## 2016-05-21 VITALS — BP 114/72 | HR 97 | Temp 97.8°F | Ht 65.0 in | Wt 149.0 lb

## 2016-05-21 DIAGNOSIS — G4700 Insomnia, unspecified: Secondary | ICD-10-CM

## 2016-05-21 DIAGNOSIS — R8290 Unspecified abnormal findings in urine: Secondary | ICD-10-CM | POA: Diagnosis not present

## 2016-05-21 DIAGNOSIS — Z0000 Encounter for general adult medical examination without abnormal findings: Secondary | ICD-10-CM | POA: Diagnosis not present

## 2016-05-21 LAB — POCT URINALYSIS DIPSTICK
Bilirubin, UA: NEGATIVE
GLUCOSE UA: NEGATIVE
Ketones, UA: NEGATIVE
Leukocytes, UA: NEGATIVE
NITRITE UA: NEGATIVE
Spec Grav, UA: 1.02
UROBILINOGEN UA: 0.2
pH, UA: 6

## 2016-05-21 NOTE — Progress Notes (Signed)
   Subjective:    Patient ID: Robin Chaney, female    DOB: 04/12/1951, 65 y.o.   MRN: [id number]  HPI  65 year old Female for health maintenance exam and evaluation of medical issues.Weight is increased 1 pound from last visit. She weighed 140 pounds in December 2014. She has a history of allergic rhinitis, insomnia, migraine headaches and occasional back pain.  No known drug allergies  Past medical history: History of pneumonia as an adult and has a young child. History of poison ivy June 2010. Is had breast implants twice. Had D&C at [facility] for menorrhagia.  Patient is allergic to trees and pollen  Tetanus immunization done June 2011. Flu vaccine through employment. Pneumovax immunization 2002. Zostavax vaccine 2012.  GYN is Marylynn Pearson M.D.  Colonoscopy by Dr. Estanislado Emms 2008  History of insomnia for which she takes Ambien. This was reviewed with her today and seems appropriate.  Social history: She is a Marine scientist at [employer]. She has college degree. She is divorced. Does not smoke. Social alcohol consumption. No children.  Family history: Father died at age 65 of lung cancer. Mother died at age 20 of an MI with history of peripheral vascular disease and breast cancer. 2 brothers, one of whom has hypertension. One sister in good health.    Review of Systems see above. Thinks she may have a UTI. Has noticed dark color to her urine.    History of migraine headaches Objective:   Physical Exam  Constitutional: She is oriented to person, place, and time. She appears well-developed and well-nourished. No distress.  HENT:  Head: Normocephalic and atraumatic.  Right Ear: External ear normal.  Left Ear: External ear normal.  Nose: Nose normal.  Mouth/Throat: Oropharynx is clear and moist. No oropharyngeal exudate.  Eyes: Conjunctivae and EOM are normal. Pupils are equal, round, and reactive to light. Right eye exhibits no discharge. Left eye exhibits no discharge. No  scleral icterus.  Neck: Neck supple.  Cardiovascular: Normal rate, regular rhythm, normal heart sounds and intact distal pulses.   No murmur heard. Pulmonary/Chest: Effort normal and breath sounds normal. No respiratory distress. She has no wheezes. She has no rales.  Breast implants noted  Abdominal: Bowel sounds are normal. She exhibits no distension and no mass. There is no tenderness. There is no rebound and no guarding.  Genitourinary:  Genitourinary Comments: Deferred to GYN  Musculoskeletal: She exhibits no edema.  Neurological: She is alert and oriented to person, place, and time. She has normal reflexes. No cranial nerve deficit. Coordination normal.  Skin: Skin is warm and dry. No rash noted. She is not diaphoretic.  Psychiatric: She has a normal mood and affect. Her behavior is normal. Judgment and thought content normal.  Vitals reviewed.         Assessment & Plan:  Bilateral breast implants  History of insomnia  History of migraine headaches  History of occasional back pain  Abnormal urine specimen-check for UTI. May be GYN related if she's having some spotting  History of allergic rhinitis  Plan: Okay to refill Ambien for 6 months and return in 6 months. Urine culture pending.  Addendum: 05/24/2016-- Urine culture no growth no growth. Seems to have GYN issue with spotting and will contact GYN physician

## 2016-05-22 ENCOUNTER — Telehealth: Payer: Self-pay | Admitting: Internal Medicine

## 2016-05-22 DIAGNOSIS — N950 Postmenopausal bleeding: Secondary | ICD-10-CM | POA: Diagnosis not present

## 2016-05-22 LAB — URINALYSIS, MICROSCOPIC ONLY: YEAST: NONE SEEN [HPF]

## 2016-05-22 MED FILL — PROGESTERONE 200 MG CAPSULE: 200 | 12 days supply | Qty: 12 | Fill #0

## 2016-05-22 NOTE — Telephone Encounter (Signed)
Her urine culture is pending

## 2016-05-22 NOTE — Telephone Encounter (Signed)
States that her blood is darker this morning and she is now having some abdominal cramping.  States that the cramping has been going on probably a few days.  States that this is all starting to come together for her now.  No fever.  She knows that the culture will take a few days, but she wanted to let you know about these additional facts.

## 2016-05-23 ENCOUNTER — Telehealth: Payer: Self-pay | Admitting: Internal Medicine

## 2016-05-23 LAB — URINE CULTURE: Organism ID, Bacteria: NO GROWTH

## 2016-05-23 NOTE — Telephone Encounter (Signed)
Yesterday woke up and was having heavy bleeding.  She called her GYN.  They are sending her for U/S (toward end of September).  They have increased Prometrium from 100mg  to 200mg  for a few days to stop the bleeding.  Once the bleeding stops and she has [place], they'll go from there.    GYN will follow her for this issue.  And, advised patient that we will call her with culture results.    FYI.

## 2016-05-23 NOTE — Telephone Encounter (Signed)
Please call patient. Urine culture has no growth.

## 2016-05-24 NOTE — Telephone Encounter (Signed)
Called patient. Gave lab results. Patient verbalized understanding.  

## 2016-05-28 MED FILL — MEDROXYPROGESTERONE 10 MG T: 10 | 10 days supply | Qty: 10 | Fill #0

## 2016-05-30 ENCOUNTER — Other Ambulatory Visit: Payer: Self-pay | Admitting: Internal Medicine

## 2016-05-30 NOTE — Telephone Encounter (Signed)
Refilled #[phone] with no refill by phone

## 2016-06-02 NOTE — Patient Instructions (Signed)
Urine culture is pending. Continue to work on diet and exercise efforts. Ambien will be refilled for 6 months. Return in 6 months to follow-up on Ambien therapy. Have annual flu vaccine through employment. It was a pleasure to see you today.

## 2016-06-04 ENCOUNTER — Ambulatory Visit (INDEPENDENT_AMBULATORY_CARE_PROVIDER_SITE_OTHER): Payer: 59 | Admitting: Pulmonary Disease

## 2016-06-04 ENCOUNTER — Telehealth: Payer: Self-pay | Admitting: Pulmonary Disease

## 2016-06-04 ENCOUNTER — Ambulatory Visit: Payer: 59 | Admitting: Internal Medicine

## 2016-06-04 ENCOUNTER — Ambulatory Visit (INDEPENDENT_AMBULATORY_CARE_PROVIDER_SITE_OTHER)
Admission: RE | Admit: 2016-06-04 | Discharge: 2016-06-04 | Disposition: A | Discharge status: Home / Self Care | Payer: 59 | Source: Ambulatory Visit | Attending: Pulmonary Disease | Admitting: Pulmonary Disease

## 2016-06-04 ENCOUNTER — Encounter: Payer: Self-pay | Admitting: Pulmonary Disease

## 2016-06-04 VITALS — BP 158/72 | HR 79

## 2016-06-04 DIAGNOSIS — R05 Cough: Secondary | ICD-10-CM

## 2016-06-04 DIAGNOSIS — R059 Cough, unspecified: Secondary | ICD-10-CM

## 2016-06-04 DIAGNOSIS — R0602 Shortness of breath: Secondary | ICD-10-CM | POA: Diagnosis not present

## 2016-06-04 MED ORDER — PREDNISONE 10 MG PO TABS: ORAL_TABLET | ORAL | 0 refills | Status: DC

## 2016-06-04 MED ORDER — FLUTICASONE FUROATE-VILANTEROL 100-25 MCG/INH IN AEPB: 1.0000 | INHALATION_SPRAY | Freq: Every day | RESPIRATORY_TRACT | 5 refills | Status: DC

## 2016-06-04 MED FILL — predniSONE 10 MG TABS: 10 | 6 days supply | Qty: 7 | Fill #0

## 2016-06-04 MED FILL — MEDROXYPROGESTERONE 2.5 MG: 2.5 | 30 days supply | Qty: 30 | Fill #0

## 2016-06-04 NOTE — Progress Notes (Signed)
Past surgical history She  has a past surgical history that includes Tonsillectomy and adenoidectomy (1977).  Family history Her family history includes Allergies in her mother and sister; Asthma in her mother; Breast cancer in her mother; Heart disease in her father and mother; Lung cancer in her father; Rheum arthritis in her mother.  Social history She  reports that she has never smoked. She has never used smokeless tobacco. She reports that she drinks alcohol. She reports that she does not use drugs.  Allergies  Allergen Reactions  . Codeine     REACTION: Intolerant-nausea    Current Outpatient Prescriptions on File Prior to Visit  Medication Sig  . aspirin-acetaminophen-caffeine (EXCEDRIN MIGRAINE) 250-250-65 MG per tablet Take 1 tablet by mouth every 6 (six) hours as needed.    Marland Kitchen BIOTIN 5000 PO Take by mouth.  . cholecalciferol (VITAMIN D) 1000 UNITS tablet Take 1,000 Units by mouth daily.   Marland Kitchen estradiol (VIVELLE-DOT) 0.1 MG/24HR patch Place 1 patch onto the skin 2 (two) times a week.  . fexofenadine (ALLEGRA) 180 MG tablet Take 180 mg by mouth daily as needed.    Marland Kitchen olopatadine (PATANOL) 0.1 % ophthalmic solution PLACE 1 DROP IN [facility] EYE ONCE DAILY FOR ALLERGY SYMPTOMS  . zolpidem (AMBIEN) 10 MG tablet TAKE 1 TABLET BY MOUTH ONCE DAILY AT BEDTIME FOR SLEEP   No current facility-administered medications on file prior to visit.     Chief Complaint  Patient presents with  . Pulmonary Consult    Self Referral- former [facility] patient. Pt c/o SOB, chest tightness and cough x 3-4 weeks. BP is high today possibly d/t using Albuterol hfa    Tests Spirometry 06/03/16 >> FEV1 1.91 (76%), FEV1% 77  Past medical history She  has a past medical history of Allergic conjunctivitis and Allergic rhinitis.  Vital signs BP (!) 158/72 (BP Location: Left Arm, Cuff Size: Normal)   Pulse 79   SpO2 99%   History of present illness Robin Chaney is a 65 y.o. female with dyspnea.  She was seen  several years ago by Dr. Annamaria Boots for allergies.  She has allergies to trees and grasses.  She used to get frequent episodes of respiratory infections.  She was treated with allergy shots, and her immune system got better.  She has gotten respiratory infections as frequently.  She has not been on allergy shots for several years.  She developed a cough and chest tightness several weeks ago.  She does not recall anything in particular that changed.  She has been getting wheezing also, and feels like her breathing is hard.  She has soreness when she takes a deep breath.  She has not had fever, sputum, or hemoptysis.  She denies skin rash, leg swelling.  Her sinuses have been okay.  She developed vaginal bleeding and was started on progesterone >> she read that cough was side effect of her medicine.  Her medicine was changed, but not change to cough.  She has never been dx'ed with asthma before.  She tried a friend's albuterol inhaler >> helped briefly.  She has not been on Abx or prednisone recently.  No recent chest xray or PFTs.  She denies problems with aspirin.  No food allergies.  She gets localized swelling from bee stings, but no hx of angioedema.    She is from [place].  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  No hx of smoking.  Denies recent travel.  She has pet  cat and dog.   Physical exam  General - No distress ENT - No sinus tenderness, no oral exudate, no LAN, no thyromegaly, TM clear, pupils equal/reactive Cardiac - s1s2 regular, no murmur, pulses symmetric Chest - decreased respiratory excursion, faint crackles b/l, no wheeze, no dullness Back - No focal tenderness Abd - Soft, non-tender, no organomegaly, + bowel sounds Ext - No edema Neuro - Normal strength, cranial nerves intact Skin - No rashes Psych - Normal mood, and behavior   CMP Latest Ref Rng & Units 05/17/2016 12/26/2014 09/10/2013  Glucose 65 - 99 mg/dL 95 99 88  BUN 7 - 25 mg/dL 18 19 15   Creatinine 0.50  - 0.99 mg/dL 0.55 0.57 0.52  Sodium 135 - 146 mmol/L 138 140 138  Potassium 3.5 - 5.3 mmol/L 4.1 5.1 4.0  Chloride 98 - 110 mmol/L 106 106 105  CO2 20 - 31 mmol/L 20 26 26   Calcium 8.6 - 10.4 mg/dL 8.4(L) 9.0 8.6  Total Protein 6.1 - 8.1 g/dL 5.9(L) 6.3 6.3  Total Bilirubin 0.2 - 1.2 mg/dL 0.4 0.4 0.5  Alkaline Phos 33 - 130 U/L 61 75 59  AST 10 - 35 U/L 13 16 14   ALT 6 - 29 U/L 9 12 <8     CBC Latest Ref Rng & Units 05/17/2016 12/26/2014 09/10/2013  WBC 3.8 - 10.8 K/uL 6.6 3.7(L) 4.1  Hemoglobin 11.7 - 15.5 g/dL 13.5 13.9 14.0  Hematocrit 35.0 - 45.0 % 40.7 42.9 41.1  Platelets 140 - 400 K/uL 392 369 380    She was not able to perform FeNO testing today.  Spirometry showed mild restrictive defect.  Discussion 65 yo never smoker with persistent cough, wheeze, chest tightness, and dyspnea of several weeks duration.  She has hx of allergies to grasses and trees.  Spirometry shows mild restriction.  Recent lab work was unrevealing.  Her clinical symptoms are suggestive of asthma, but her spirometry and physical findings are not concordant with this impression.  Assessment/plan  Cough with mild restrictive defect on spirometry. - will give course of prednisone - will try her on Breo and she can continue prn albuterol - will get chest xray today - I had recommended full PFTs >> she would like to hold off on this for now   Patient Instructions  Breo one puff daily  Prednisone 10 mg pill >> 2 pills daily for 2 days, 1 pill daily for 2 days, 1/2 pill daily for 2 days  Chest xray today  Call to schedule follow up in 4 to 6 weeks after you have reviewed your work schedule    Chesley Mires, MD [facility] Pager:  [phone] 06/04/2016, 9:59 AM

## 2016-06-04 NOTE — Addendum Note (Signed)
Addended by: Virl Cagey on: 06/04/2016 10:51 AM   Modules accepted: Orders

## 2016-06-04 NOTE — Patient Instructions (Signed)
Breo one puff daily  Prednisone 10 mg pill >> 2 pills daily for 2 days, 1 pill daily for 2 days, 1/2 pill daily for 2 days  Chest xray today  Call to schedule follow up in 4 to 6 weeks after you have reviewed your work schedule

## 2016-06-04 NOTE — Telephone Encounter (Signed)
Called spoke with pt. She is requesting results from cxr done today after ov. I explained to her that we have not received VS's results and recs. I informed her that I would send a message and returned her call once we receive his recs. She voiced understanding and had no further questions.   VS please advise

## 2016-06-04 NOTE — Progress Notes (Signed)
   Subjective:    Patient ID: Robin Chaney, female    DOB: [birth year], 65 y.o.   MRN: [id number]  HPI    Review of Systems  Constitutional: Negative for fever and unexpected weight change.  HENT: Negative for congestion, dental problem, ear pain, nosebleeds, postnasal drip, rhinorrhea, sinus pressure, sneezing, sore throat and trouble swallowing.   Eyes: Negative for redness and itching.  Respiratory: Positive for cough and shortness of breath. Negative for chest tightness and wheezing.   Cardiovascular: Positive for chest pain. Negative for palpitations and leg swelling.  Gastrointestinal: Negative for nausea and vomiting.  Genitourinary: Positive for pelvic pain and vaginal bleeding. Negative for dysuria.  Musculoskeletal: Negative for joint swelling.  Skin: Negative for rash.  Neurological: Positive for headaches.  Hematological: Does not bruise/bleed easily.  Psychiatric/Behavioral: Negative for dysphoric mood. The patient is not nervous/anxious.        Objective:   Physical Exam        Assessment & Plan:

## 2016-06-05 DIAGNOSIS — N760 Acute vaginitis: Secondary | ICD-10-CM | POA: Diagnosis not present

## 2016-06-05 DIAGNOSIS — N390 Urinary tract infection, site not specified: Secondary | ICD-10-CM | POA: Diagnosis not present

## 2016-06-05 MED FILL — metroNIDAZOLE 500 MG TABS: 500 | 7 days supply | Qty: 14 | Fill #0

## 2016-06-05 NOTE — Telephone Encounter (Signed)
Spoke with pt. She is aware of results. Nothing further was needed.  

## 2016-06-05 NOTE — Telephone Encounter (Signed)
Dg Chest 2 View  Result Date: 06/04/2016 CLINICAL DATA:  Shortness of breath with cough and congestion for 1 week EXAM: CHEST  2 VIEW COMPARISON:  None. FINDINGS: Lungs are clear. Heart size and pulmonary vascularity are normal. No adenopathy. No bone lesions. IMPRESSION: No edema or consolidation. Electronically Signed   By: Lowella Grip III M.D.   On: 06/04/2016 10:14    Please let her now the chest xray was normal.  She does not need to have PFT done at this time.  She should complete course of prednisone and sample of Breo.

## 2016-06-07 MED FILL — ZOLPIDEM TARTRATE 10 MG TAB: 10 | 90 days supply | Qty: 90 | Fill #0

## 2016-06-14 DIAGNOSIS — N924 Excessive bleeding in the premenopausal period: Secondary | ICD-10-CM | POA: Diagnosis not present

## 2016-06-14 DIAGNOSIS — D259 Leiomyoma of uterus, unspecified: Secondary | ICD-10-CM | POA: Diagnosis not present

## 2016-06-14 MED FILL — ESTRADIOL 0.075 MG PATCH: 0.075 | 84 days supply | Qty: 24 | Fill #0

## 2016-06-18 MED FILL — PROGESTERONE 200 MG CAPSULE: 200 | 30 days supply | Qty: 30 | Fill #0

## 2016-07-05 MED FILL — miSOPROStol 200 MCG TABS: 200 | 1 days supply | Qty: 1 | Fill #0

## 2016-07-11 DIAGNOSIS — N840 Polyp of corpus uteri: Secondary | ICD-10-CM | POA: Diagnosis not present

## 2016-07-11 DIAGNOSIS — N950 Postmenopausal bleeding: Secondary | ICD-10-CM | POA: Diagnosis not present

## 2016-07-11 DIAGNOSIS — Z3202 Encounter for pregnancy test, result negative: Secondary | ICD-10-CM | POA: Diagnosis not present

## 2016-07-30 MED FILL — PROGESTERONE 100 MG CAPSULE: 100 | 90 days supply | Qty: 90 | Fill #0

## 2016-07-30 MED FILL — HYDROCODON-APAP 5-325: 5-325 | 5 days supply | Qty: 20 | Fill #0

## 2016-08-30 ENCOUNTER — Other Ambulatory Visit: Payer: Self-pay | Admitting: Internal Medicine

## 2016-08-30 MED FILL — ESTRADIOL 0.075 MG PATCH: 0.075 | 84 days supply | Qty: 24 | Fill #1

## 2016-08-31 NOTE — Telephone Encounter (Signed)
Refill x 6 months 

## 2016-09-02 NOTE — Telephone Encounter (Signed)
Ambien called into [facility].

## 2016-09-03 MED FILL — ZOLPIDEM TARTRATE 10 MG TAB: 10 | 90 days supply | Qty: 90 | Fill #0

## 2016-09-26 ENCOUNTER — Encounter: Payer: Self-pay | Admitting: Internal Medicine

## 2016-09-26 ENCOUNTER — Ambulatory Visit (INDEPENDENT_AMBULATORY_CARE_PROVIDER_SITE_OTHER): Payer: 59 | Admitting: Internal Medicine

## 2016-09-26 VITALS — BP 128/78 | HR 71 | Temp 98.6°F | Ht 65.0 in | Wt 143.0 lb

## 2016-09-26 DIAGNOSIS — J9801 Acute bronchospasm: Secondary | ICD-10-CM

## 2016-09-26 DIAGNOSIS — J069 Acute upper respiratory infection, unspecified: Secondary | ICD-10-CM | POA: Diagnosis not present

## 2016-09-26 MED ORDER — PREDNISONE 10 MG PO TABS: ORAL_TABLET | ORAL | 0 refills | Status: DC

## 2016-09-26 MED ORDER — HYDROCODONE-HOMATROPINE 5-1.5 MG/5ML PO SYRP: 5.0000 mL | ORAL_SOLUTION | Freq: Three times a day (TID) | ORAL | 0 refills | Status: DC | PRN

## 2016-09-26 MED ORDER — FLUCONAZOLE 150 MG PO TABS: 150.0000 mg | ORAL_TABLET | Freq: Once | ORAL | 0 refills | Status: DC

## 2016-09-26 MED ORDER — BENZONATATE 100 MG PO CAPS: 100.0000 mg | ORAL_CAPSULE | Freq: Two times a day (BID) | ORAL | 0 refills | Status: DC | PRN

## 2016-09-26 MED ORDER — GLYCOPYRROLATE-FORMOTEROL 9-4.8 MCG/ACT IN AERO: 9.0000 | INHALATION_SPRAY | RESPIRATORY_TRACT | 0 refills | Status: DC

## 2016-09-26 MED ORDER — AZITHROMYCIN 250 MG PO TABS: ORAL_TABLET | ORAL | 0 refills | Status: DC

## 2016-09-26 MED FILL — predniSONE 10 MG TABS: 10 | 6 days supply | Qty: 21 | Fill #0

## 2016-09-26 MED FILL — AZITHROMYCIN 250 MG TABLET: 250 | 5 days supply | Qty: 6 | Fill #0

## 2016-09-26 MED FILL — HYDROCODONE-HOMATROPINE SYR: 5-1.5 | 8 days supply | Qty: 120 | Fill #0

## 2016-10-04 NOTE — Progress Notes (Signed)
   Subjective:    Patient ID: Robin Chaney, female    DOB: 1951/01/30, 65 y.o.   MRN: [id number]  HPI Three-day history of URI symptoms. Going out of town for Christmas holidays. Has malaise fatigue and sore throat. Cough and congestion.    Review of Systems the above     Objective:   Physical Exam Says nasally congested. Pharynx injected without exudate. TM slightly full. Neck supple without significant adenopathy. Chest: Occasional scattered wheeze bilaterally.       Assessment & Plan:  Acute URI  Acute bronchospasm  Plan: Hycodan 1 teaspoon by mouth every 8 hours when necessary cough. Bevespi inhaler sample provided. Tessalon Perles 100 mg 3 times daily as needed for cough. Zithromax Z-PAK take 2 tablets day one followed by 1 tablet days 2 through 5. Rest and drink plenty of fluids. Tapering course of prednisone going from 60 mg to 0 mg over 6 days.

## 2016-10-04 NOTE — Patient Instructions (Signed)
Prednisone in tapering course as directed. Zithromax Z-PAK as directed. Use inhalers as directed. Hycodan as needed for cough. Tessalon Perles as needed for cough. Diflucan if needed for Candida vaginitis. Rest and drink plenty of fluids.

## 2016-10-07 HISTORY — PX: COLONOSCOPY: SHX174

## 2016-10-28 MED FILL — PROGESTERONE 100 MG CAPSULE: 100 | 90 days supply | Qty: 90 | Fill #0

## 2016-11-27 DIAGNOSIS — N951 Menopausal and female climacteric states: Secondary | ICD-10-CM | POA: Diagnosis not present

## 2016-11-28 ENCOUNTER — Other Ambulatory Visit: Payer: Self-pay | Admitting: Internal Medicine

## 2016-11-29 MED FILL — ZOLPIDEM TARTRATE 10 MG TAB: 10 | 90 days supply | Qty: 90 | Fill #1

## 2016-12-27 ENCOUNTER — Ambulatory Visit (INDEPENDENT_AMBULATORY_CARE_PROVIDER_SITE_OTHER): Payer: 59 | Admitting: Internal Medicine

## 2016-12-27 ENCOUNTER — Encounter: Payer: Self-pay | Admitting: Internal Medicine

## 2016-12-27 VITALS — BP 150/80 | HR 68 | Temp 97.4°F | Ht 65.0 in | Wt 143.0 lb

## 2016-12-27 DIAGNOSIS — M5431 Sciatica, right side: Secondary | ICD-10-CM | POA: Diagnosis not present

## 2016-12-27 MED ORDER — MELOXICAM 15 MG PO TABS: 15.0000 mg | ORAL_TABLET | Freq: Every day | ORAL | 1 refills | Status: DC

## 2016-12-27 MED ORDER — PREDNISONE 10 MG PO TABS: ORAL_TABLET | ORAL | 1 refills | Status: DC

## 2016-12-27 MED FILL — MELOXICAM 15 MG TABLET: 15 | 90 days supply | Qty: 90 | Fill #0

## 2016-12-27 MED FILL — predniSONE 10 MG TABS: 10 | 6 days supply | Qty: 21 | Fill #0

## 2016-12-27 NOTE — Progress Notes (Signed)
   Subjective:    Patient ID: Robin Chaney, female    DOB: 07-13-1951, 66 y.o.   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 She is working on a few days a week but has a lot of pushing and pulling and lifting involved in her job.  Patient was seen January 2017 with several day history of right lower back pain radiating into right leg. Was treated with a course of prednisone and improved.  In November 2017 she had a mini facelift by Dr. Harlow Mares.  History of recurrent urinary infections. Is sexually active.  She has seen Dr. Annamaria Boots for allergies in the past. Allergic to trees and grasses. Spirometry showed mild restriction. She saw Dr. Halford Chessman for cough in August 2017. Was treated with Breo inhaler and short course of prednisone  Last health maintenance exam here March 2016.  History of insomnia.   Review of Systems see above     Objective:   Physical Exam  Musculoskeletal:  Straight leg raising negative at 90 degrees. muscle strength 5 over 5 in the lower extremities. Deep tendon reflexes in the knees 2+ and symmetrical.          Assessment & Plan:  Right sciatica  Plan: Sterapred DS 10 mg 6 day dosepak with 1 refill

## 2017-01-03 ENCOUNTER — Other Ambulatory Visit: Payer: Self-pay | Admitting: Internal Medicine

## 2017-01-03 MED FILL — OLOPATADINE HCL 0.1% EYE DR: 0.1 | 30 days supply | Qty: 5 | Fill #0

## 2017-01-03 NOTE — Patient Instructions (Signed)
Take prednisone in tapering courses directed.

## 2017-01-13 MED FILL — ESTROGEL 0.06% GEL: 0.75 MG/1.2 | 30 days supply | Qty: 50 | Fill #0

## 2017-02-03 MED FILL — PROGESTERONE 100 MG CAPSULE: 100 | 90 days supply | Qty: 90 | Fill #0

## 2017-02-18 ENCOUNTER — Encounter: Payer: Self-pay | Admitting: Internal Medicine

## 2017-02-18 ENCOUNTER — Ambulatory Visit (INDEPENDENT_AMBULATORY_CARE_PROVIDER_SITE_OTHER): Payer: 59 | Admitting: Internal Medicine

## 2017-02-18 VITALS — BP 138/80 | HR 69 | Temp 98.6°F | Ht 65.0 in | Wt 143.0 lb

## 2017-02-18 DIAGNOSIS — R5383 Other fatigue: Secondary | ICD-10-CM

## 2017-02-18 DIAGNOSIS — G4709 Other insomnia: Secondary | ICD-10-CM | POA: Diagnosis not present

## 2017-02-18 DIAGNOSIS — R232 Flushing: Secondary | ICD-10-CM | POA: Diagnosis not present

## 2017-02-18 LAB — TSH: TSH: 2.37 mIU/L

## 2017-02-18 MED ORDER — ESCITALOPRAM OXALATE 10 MG PO TABS: 10.0000 mg | ORAL_TABLET | Freq: Every day | ORAL | 0 refills | Status: DC

## 2017-02-18 MED FILL — ESCITALOPRAM 10 MG TABLET: 10 | 30 days supply | Qty: 30 | Fill #0

## 2017-02-24 ENCOUNTER — Other Ambulatory Visit: Payer: Self-pay | Admitting: Internal Medicine

## 2017-02-24 MED FILL — ZOLPIDEM TARTRATE 10 MG TAB: 10 | 90 days supply | Qty: 90 | Fill #0

## 2017-02-24 NOTE — Telephone Encounter (Signed)
Refill x 6 months. Was just seen here.

## 2017-02-24 NOTE — Telephone Encounter (Signed)
Refill x 6 months 

## 2017-03-03 NOTE — Patient Instructions (Signed)
TSH checked and is within normal limits. Trial of Lexapro 10 mg daily and follow-up in 4-6 weeks.

## 2017-03-03 NOTE — Progress Notes (Signed)
   Subjective:    Patient ID: Robin Chaney, female    DOB: 1951-09-11, 66 y.o.   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 None have worked out. Tends to date younger men. Has begun to feel a bit down and worried about the future. Thinks that she is going to continue to cut back on her workload and gradually work into retirement. Spoke with her at length about the situation. Told her it was common for people his age to start to worry about the future and the uncertainty with retirement, growing older, and wanting meaningful relationships. She doesn't want to consider counseling at this point in time.  She takes Ambien to sleep. Would like to try SSRI medication. She's had issues with hot flashes. GYN wants her off estrogen replacement but every time she attempts to go off vomit she gets miserable with hot flashes.  Has been tired recently. Suggested we check TSH.    Review of Systems see above     Objective:   Physical Exam  Not examined. Spent 20 minutes speaking with her about these issues.      Assessment & Plan:  Situational depression-we spoke about how she could meet new people.  Fatigue-check TSH  Hot flashes-discuss estrogen replacement with GYN physician  Plan: She will try Lexapro 10 mg daily and advised me how she is doing in 4-6 weeks. She may call sooner if necessary.

## 2017-03-12 ENCOUNTER — Encounter: Payer: Self-pay | Admitting: Internal Medicine

## 2017-03-12 DIAGNOSIS — Z6824 Body mass index (BMI) 24.0-24.9, adult: Secondary | ICD-10-CM | POA: Diagnosis not present

## 2017-03-12 DIAGNOSIS — Z01419 Encounter for gynecological examination (general) (routine) without abnormal findings: Secondary | ICD-10-CM | POA: Diagnosis not present

## 2017-03-12 DIAGNOSIS — Z1231 Encounter for screening mammogram for malignant neoplasm of breast: Secondary | ICD-10-CM | POA: Diagnosis not present

## 2017-03-12 MED FILL — metroNIDAZOLE 500 MG TABS: 500 | 7 days supply | Qty: 14 | Fill #0

## 2017-03-17 ENCOUNTER — Telehealth: Payer: Self-pay

## 2017-03-17 DIAGNOSIS — R5383 Other fatigue: Secondary | ICD-10-CM

## 2017-03-17 MED ORDER — ESCITALOPRAM OXALATE 10 MG PO TABS: 10.0000 mg | ORAL_TABLET | Freq: Every day | ORAL | 3 refills | Status: DC

## 2017-03-17 MED FILL — ESCITALOPRAM 10 MG TABLET: 10 | 90 days supply | Qty: 90 | Fill #0

## 2017-03-17 NOTE — Telephone Encounter (Signed)
Received fax from [facility] in regards to a refill on Lexapro for patient. Medication was refilled per Dr. Verlene Mayer request. Sent for one year.

## 2017-04-03 ENCOUNTER — Other Ambulatory Visit: Payer: Self-pay | Admitting: Internal Medicine

## 2017-04-03 MED FILL — TRETINOIN 0.1% CREAM: 0.1 | 30 days supply | Qty: 20 | Fill #0

## 2017-04-11 ENCOUNTER — Telehealth: Payer: Self-pay

## 2017-04-11 DIAGNOSIS — Z1239 Encounter for other screening for malignant neoplasm of breast: Secondary | ICD-10-CM

## 2017-04-11 NOTE — Telephone Encounter (Signed)
Order for mammogram placed and letter sent

## 2017-04-28 MED FILL — PROGESTERONE 100 MG CAPSULE: 100 | 30 days supply | Qty: 30 | Fill #0

## 2017-05-07 DIAGNOSIS — D485 Neoplasm of uncertain behavior of skin: Secondary | ICD-10-CM | POA: Diagnosis not present

## 2017-05-12 MED FILL — MAGIC MOUTHWASH BOP FORM: 14 days supply | Qty: 140 | Fill #0

## 2017-05-12 MED FILL — DIVIGEL 0.5 MG GEL PACKET: 0.5 | 30 days supply | Qty: 30 | Fill #0

## 2017-05-16 DIAGNOSIS — D485 Neoplasm of uncertain behavior of skin: Secondary | ICD-10-CM | POA: Diagnosis not present

## 2017-05-22 ENCOUNTER — Other Ambulatory Visit: Payer: Self-pay | Admitting: Internal Medicine

## 2017-05-22 DIAGNOSIS — R5383 Other fatigue: Secondary | ICD-10-CM

## 2017-05-22 MED ORDER — ESCITALOPRAM OXALATE 20 MG PO TABS: 20.0000 mg | ORAL_TABLET | Freq: Every day | ORAL | 0 refills | Status: DC

## 2017-05-22 MED FILL — PEG-3350 SOLUTION: 420 | 1 days supply | Qty: 4000 | Fill #0

## 2017-05-22 MED FILL — ESCITALOPRAM 20 MG TABLET: 20 | 90 days supply | Qty: 90 | Fill #0

## 2017-05-22 NOTE — Telephone Encounter (Signed)
OK give her #90 generic Lexapro 20 mg with NO refill- one po daily

## 2017-05-23 MED FILL — PROGESTERONE 100 MG CAPSULE: 100 | 30 days supply | Qty: 30 | Fill #1

## 2017-05-23 MED FILL — ZOLPIDEM TARTRATE 10 MG TAB: 10 | 90 days supply | Qty: 90 | Fill #1

## 2017-05-28 MED FILL — CEPHALEXIN 500 MG CAPSULE: 500 | 10 days supply | Qty: 30 | Fill #0

## 2017-05-29 ENCOUNTER — Ambulatory Visit: Payer: 59 | Admitting: Internal Medicine

## 2017-06-02 DIAGNOSIS — K5730 Diverticulosis of large intestine without perforation or abscess without bleeding: Secondary | ICD-10-CM | POA: Diagnosis not present

## 2017-06-02 DIAGNOSIS — Z1211 Encounter for screening for malignant neoplasm of colon: Secondary | ICD-10-CM | POA: Diagnosis not present

## 2017-06-17 MED FILL — DIVIGEL 0.5 MG GEL PACKET: 0.5 | 30 days supply | Qty: 30 | Fill #1

## 2017-06-30 MED FILL — PROGESTERONE 100 MG CAPSULE: 100 | 30 days supply | Qty: 30 | Fill #2

## 2017-07-21 MED FILL — metroNIDAZOLE 500 MG TABS: 500 | 7 days supply | Qty: 14 | Fill #1

## 2017-07-29 MED FILL — OLOPATADINE HCL 0.1 % SOLN: 0.1 | 30 days supply | Qty: 5 | Fill #1

## 2017-07-29 MED FILL — DIVIGEL 0.5 MG GEL PACKET: 0.5 | 30 days supply | Qty: 30 | Fill #2

## 2017-07-29 MED FILL — PROGESTERONE 100 MG CAPSULE: 100 | 30 days supply | Qty: 30 | Fill #3

## 2017-08-18 ENCOUNTER — Other Ambulatory Visit: Payer: Self-pay | Admitting: Internal Medicine

## 2017-08-18 NOTE — Telephone Encounter (Signed)
Last seen May 2018 and last PE was 2017. Please book appt for insomnia

## 2017-08-19 ENCOUNTER — Encounter: Payer: Self-pay | Admitting: Internal Medicine

## 2017-08-19 ENCOUNTER — Ambulatory Visit (INDEPENDENT_AMBULATORY_CARE_PROVIDER_SITE_OTHER): Payer: 59 | Admitting: Internal Medicine

## 2017-08-19 VITALS — BP 112/64 | HR 60 | Temp 97.8°F | Wt 152.0 lb

## 2017-08-19 DIAGNOSIS — G4700 Insomnia, unspecified: Secondary | ICD-10-CM

## 2017-08-19 DIAGNOSIS — Z23 Encounter for immunization: Secondary | ICD-10-CM

## 2017-08-19 DIAGNOSIS — M545 Low back pain, unspecified: Secondary | ICD-10-CM

## 2017-08-19 MED ORDER — ZOLPIDEM TARTRATE 10 MG PO TABS: 10.0000 mg | ORAL_TABLET | Freq: Every evening | ORAL | 1 refills | Status: DC | PRN

## 2017-08-19 NOTE — Progress Notes (Signed)
   Subjective:    Patient ID: Robin Chaney, female    DOB: Feb 21, 1951, 66 y.o.   MRN: [id number]  HPI In today to discuss refill on Ambien for insomnia.  Long-standing history of shift workers insomnia treated with Ambien.  She is also developed some back pain.  Was treated here with right-sided sciatica March 2018.  Would like to receive pneumococcal immunization.  Pneumovax 23 given today.  Is okay taking short course of prednisone for back pain.  Remains on Lexapro for dysthymia and depression.    Review of Systems see above     Objective:   Physical Exam No evidence of radiculopathy straight leg raising is negative at 90 degrees bilaterally.  Muscle strength is normal.  Affect is normal.  Uses Ambien appropriately.       Assessment & Plan:  Long-standing history of insomnia-related to shift work  Low back pain  Health maintenance  Plan: Patient will take tapering course of prednisone going from 60 mg to 0 mg over 7 days.  Prescription for Celebrex.  Ambien refilled for 6 months.  25 minutes spent with patient.  Physical exam booked for February. Pneumococcal 23 given today.  Thinks she had Prevnar through employment.  She needs to check on that date.

## 2017-08-21 MED FILL — ZOLPIDEM TARTRATE 10 MG TAB: 10 | 90 days supply | Qty: 90 | Fill #0

## 2017-08-25 ENCOUNTER — Telehealth: Payer: Self-pay

## 2017-08-25 MED ORDER — CELECOXIB 200 MG PO CAPS: 200.0000 mg | ORAL_CAPSULE | Freq: Two times a day (BID) | ORAL | 0 refills | Status: DC

## 2017-08-25 MED ORDER — PREDNISONE 10 MG PO TABS: ORAL_TABLET | ORAL | 0 refills | Status: DC

## 2017-08-25 MED FILL — predniSONE 10 MG TABS: 10 | 6 days supply | Qty: 21 | Fill #0

## 2017-08-25 MED FILL — PROGESTERONE 100 MG CAPSULE: 100 | 30 days supply | Qty: 30 | Fill #4

## 2017-08-25 MED FILL — CELECOXIB 200 MG CAPS: 200 | 15 days supply | Qty: 30 | Fill #0

## 2017-08-25 NOTE — Telephone Encounter (Signed)
Please call in Prednisone 10 mg #21 6-5-4-3-2-1 taper and Celebrex 200 once a day(#30)

## 2017-08-25 NOTE — Telephone Encounter (Signed)
Medication sent and Pt is aware

## 2017-08-25 NOTE — Telephone Encounter (Signed)
Pt called and stated she would like to be put on Celebrex and Prednisone. She said it was discussed at her office visit last week, and that her back pain has gotten worse which is why she feels she needs both. Please advise.

## 2017-08-31 NOTE — Patient Instructions (Addendum)
Take Ambien for insomnia.  Take prednisone for low back pain.  Then use Celebrex after completing 7-day prednisone taper.  Pneumococcal 23 given today.  Physical exam booked for February

## 2017-09-08 MED FILL — DIVIGEL 0.5 MG GEL PACKET: 0.5 | 30 days supply | Qty: 30 | Fill #3

## 2017-09-17 ENCOUNTER — Other Ambulatory Visit: Payer: Self-pay | Admitting: Internal Medicine

## 2017-09-17 MED FILL — CELECOXIB 200 MG CAP: 200 | 30 days supply | Qty: 60 | Fill #0

## 2017-09-23 ENCOUNTER — Other Ambulatory Visit: Payer: Self-pay | Admitting: Internal Medicine

## 2017-09-23 MED FILL — ESCITALOPRAM 20 MG TABLET: 20 | 90 days supply | Qty: 90 | Fill #0

## 2017-10-01 MED FILL — PROGESTERONE 100 MG CAPSULE: 100 | 30 days supply | Qty: 30 | Fill #5

## 2017-10-15 MED FILL — DIVIGEL 0.5 MG GEL PACKET: 0.5 | 30 days supply | Qty: 30 | Fill #4

## 2017-10-21 MED FILL — CELECOXIB 200 MG CAP: 200 | 30 days supply | Qty: 60 | Fill #1

## 2017-10-24 ENCOUNTER — Other Ambulatory Visit: Payer: Self-pay | Admitting: Internal Medicine

## 2017-10-24 DIAGNOSIS — E559 Vitamin D deficiency, unspecified: Secondary | ICD-10-CM

## 2017-10-24 DIAGNOSIS — Z1329 Encounter for screening for other suspected endocrine disorder: Secondary | ICD-10-CM

## 2017-10-24 DIAGNOSIS — Z0000 Encounter for general adult medical examination without abnormal findings: Secondary | ICD-10-CM

## 2017-10-24 DIAGNOSIS — Z13220 Encounter for screening for lipoid disorders: Secondary | ICD-10-CM

## 2017-11-03 MED FILL — PROGESTERONE 100 MG CAPSULE: 100 | 30 days supply | Qty: 30 | Fill #6

## 2017-11-18 MED FILL — ZOLPIDEM TARTRATE 10 MG TAB: 10 | 90 days supply | Qty: 90 | Fill #1

## 2017-11-25 ENCOUNTER — Other Ambulatory Visit: Payer: 59 | Admitting: Internal Medicine

## 2017-11-25 DIAGNOSIS — Z1329 Encounter for screening for other suspected endocrine disorder: Secondary | ICD-10-CM

## 2017-11-25 DIAGNOSIS — Z13220 Encounter for screening for lipoid disorders: Secondary | ICD-10-CM | POA: Diagnosis not present

## 2017-11-25 DIAGNOSIS — Z0000 Encounter for general adult medical examination without abnormal findings: Secondary | ICD-10-CM | POA: Diagnosis not present

## 2017-11-25 DIAGNOSIS — E559 Vitamin D deficiency, unspecified: Secondary | ICD-10-CM | POA: Diagnosis not present

## 2017-11-26 LAB — CBC WITH DIFFERENTIAL/PLATELET
Basophils Absolute: 42 cells/uL (ref 0–200)
Basophils Relative: 1 %
EOS ABS: 101 {cells}/uL (ref 15–500)
EOS PCT: 2.4 %
HCT: 38.7 % (ref 35.0–45.0)
HEMOGLOBIN: 13.4 g/dL (ref 11.7–15.5)
Lymphs Abs: 1252 cells/uL (ref 850–3900)
MCH: 30.9 pg (ref 27.0–33.0)
MCHC: 34.6 g/dL (ref 32.0–36.0)
MCV: 89.4 fL (ref 80.0–100.0)
MONOS PCT: 10.1 %
MPV: 10.2 fL (ref 7.5–12.5)
Neutro Abs: 2381 cells/uL (ref 1500–7800)
Neutrophils Relative %: 56.7 %
Platelets: 336 10*3/uL (ref 140–400)
RBC: 4.33 10*6/uL (ref 3.80–5.10)
RDW: 12.2 % (ref 11.0–15.0)
Total Lymphocyte: 29.8 %
WBC mixed population: 424 cells/uL (ref 200–950)
WBC: 4.2 10*3/uL (ref 3.8–10.8)

## 2017-11-26 LAB — COMPLETE METABOLIC PANEL WITH GFR
AG RATIO: 2 (calc) (ref 1.0–2.5)
ALT: 12 U/L (ref 6–29)
AST: 16 U/L (ref 10–35)
Albumin: 4.1 g/dL (ref 3.6–5.1)
Alkaline phosphatase (APISO): 74 U/L (ref 33–130)
BUN: 20 mg/dL (ref 7–25)
CALCIUM: 8.5 mg/dL — AB (ref 8.6–10.4)
CO2: 26 mmol/L (ref 20–32)
Chloride: 106 mmol/L (ref 98–110)
Creat: 0.54 mg/dL (ref 0.50–0.99)
GFR, EST NON AFRICAN AMERICAN: 98 mL/min/{1.73_m2} (ref >=60)
GFR, Est African American: 114 mL/min/{1.73_m2} (ref >=60)
Globulin: 2.1 g/dL (calc) (ref 1.9–3.7)
Glucose, Bld: 93 mg/dL (ref 65–99)
POTASSIUM: 4.3 mmol/L (ref 3.5–5.3)
Sodium: 137 mmol/L (ref 135–146)
Total Bilirubin: 0.4 mg/dL (ref 0.2–1.2)
Total Protein: 6.2 g/dL (ref 6.1–8.1)

## 2017-11-26 LAB — TSH: TSH: 2.27 m[IU]/L (ref 0.40–4.50)

## 2017-11-26 LAB — LIPID PANEL
Cholesterol: 189 mg/dL (ref <200)
HDL: 53 mg/dL (ref >50)
LDL Cholesterol (Calc): 113 mg/dL (calc) — ABNORMAL HIGH
NON-HDL CHOLESTEROL (CALC): 136 mg/dL — AB (ref <130)
TRIGLYCERIDES: 119 mg/dL (ref <150)
Total CHOL/HDL Ratio: 3.6 (calc) (ref <5.0)

## 2017-11-26 LAB — VITAMIN D 25 HYDROXY (VIT D DEFICIENCY, FRACTURES): Vit D, 25-Hydroxy: 23 ng/mL — ABNORMAL LOW (ref 30–100)

## 2017-11-27 ENCOUNTER — Ambulatory Visit (INDEPENDENT_AMBULATORY_CARE_PROVIDER_SITE_OTHER): Payer: 59 | Admitting: Internal Medicine

## 2017-11-27 ENCOUNTER — Encounter: Payer: Self-pay | Admitting: Internal Medicine

## 2017-11-27 VITALS — HR 71 | Ht 64.0 in | Wt 149.0 lb

## 2017-11-27 DIAGNOSIS — Z8709 Personal history of other diseases of the respiratory system: Secondary | ICD-10-CM | POA: Diagnosis not present

## 2017-11-27 DIAGNOSIS — Z8669 Personal history of other diseases of the nervous system and sense organs: Secondary | ICD-10-CM

## 2017-11-27 DIAGNOSIS — E559 Vitamin D deficiency, unspecified: Secondary | ICD-10-CM | POA: Diagnosis not present

## 2017-11-27 DIAGNOSIS — G4700 Insomnia, unspecified: Secondary | ICD-10-CM | POA: Diagnosis not present

## 2017-11-27 DIAGNOSIS — Z8739 Personal history of other diseases of the musculoskeletal system and connective tissue: Secondary | ICD-10-CM | POA: Diagnosis not present

## 2017-11-27 DIAGNOSIS — Z0000 Encounter for general adult medical examination without abnormal findings: Secondary | ICD-10-CM | POA: Diagnosis not present

## 2017-11-27 DIAGNOSIS — Z9882 Breast implant status: Secondary | ICD-10-CM | POA: Diagnosis not present

## 2017-11-27 NOTE — Progress Notes (Signed)
   Subjective:    Patient ID: Robin Chaney, female    DOB: [birth year], 67 y.o.   MRN: [id number]  HPI  67 year old Female for health maintenance exam and evaluation of medical issues. Feels well. 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  No known drug allergies  Past medical history: History of pneumonia as an adult and as a young child.  History of poison ivy June 2010.  Has had breast implants twice.  Had D&C at [facility] for menorrhagia.  Dr. Marylynn Pearson is her GYN physician.  She is allergic to trees and pollen.  Tetanus immunization done June 2011.  Flu vaccine done through employment.  Had Pneumovax immunization 2002.  Zostavax vaccine 2012.  Had colonoscopy by Dr. Watt Climes 2008.  Do not see follow-up procedure in Epic.  Long-standing history of insomnia for which she takes Ambien.  This seems appropriate.  Social history: She is a Marine scientist at [employer].  She has a college degree.  She is divorced.  Does not smoke.  Social alcohol consumption.  No children.  Family history: Father died at age 72 of lung cancer.  Mother died at age 47 of an MI of peripheral vascular disease and breast cancer.  2 brothers, 1 of whom has hypertension.  One sister in good health.    Review of Systems history of migraine headaches otherwise negative     Objective:   Physical Exam  Cardiovascular: Normal rate, regular rhythm and normal heart sounds.  No murmur heard. Pulmonary/Chest: Effort normal and breath sounds normal.  Genitourinary:  Genitourinary Comments: Deferred to GYN  Musculoskeletal: She exhibits no edema.  Vitals reviewed.         Assessment & Plan:  Vitamin D deficiency.  Level is 23.  Needs to take 2000 units vitamin D3 regularly.  Mild elevation of LDL at 113-watch diet  Remainder of labs are within normal limits.  She looks great.  Return in 1 year or as needed.  Ambien refilled for 6 months.  Take 2000 units vitamin D3 daily.

## 2017-12-01 MED FILL — CELECOXIB 200 MG CAP: 200 | 30 days supply | Qty: 60 | Fill #2

## 2017-12-01 MED FILL — DIVIGEL 0.5 MG GEL PACKET: 0.5 | 30 days supply | Qty: 30 | Fill #5

## 2017-12-02 ENCOUNTER — Telehealth: Payer: Self-pay | Admitting: Internal Medicine

## 2017-12-02 ENCOUNTER — Encounter: Payer: Self-pay | Admitting: Internal Medicine

## 2017-12-02 ENCOUNTER — Telehealth: Payer: Self-pay

## 2017-12-02 DIAGNOSIS — N760 Acute vaginitis: Secondary | ICD-10-CM

## 2017-12-02 MED ORDER — METRONIDAZOLE 500 MG PO TABS: ORAL_TABLET | ORAL | 1 refills | Status: DC

## 2017-12-02 MED FILL — metroNIDAZOLE 500 MG TABS: 500 | 7 days supply | Qty: 14 | Fill #0

## 2017-12-02 NOTE — Telephone Encounter (Signed)
Patient called requesting a prescription for FLAGYL for bacteria vaginosis. rx sent to preferred pharmacy.

## 2017-12-02 NOTE — Patient Instructions (Signed)
Okay to refill Ambien for 6 months.  It was a pleasure to see you today.  Return in 1 year or as needed.  Take 2000 units vitamin D3 daily

## 2017-12-02 NOTE — Telephone Encounter (Signed)
Patient called twice regarding bacterial vaginosis symptoms.  Wants Flagyl called in.  We will refill this with 1 refill.  Explained to patient that we were busy seeing patients today in these types of calls or handle this time permits.

## 2017-12-03 LAB — POCT URINALYSIS DIPSTICK
APPEARANCE: NORMAL
Bilirubin, UA: NEGATIVE
GLUCOSE UA: NEGATIVE
Ketones, UA: NEGATIVE
LEUKOCYTES UA: NEGATIVE
Nitrite, UA: NEGATIVE
ODOR: NORMAL
Protein, UA: NEGATIVE
RBC UA: NEGATIVE
Spec Grav, UA: 1.015 (ref 1.010–1.025)
Urobilinogen, UA: 0.2 E.U./dL
pH, UA: 6.5 (ref 5.0–8.0)

## 2017-12-08 MED FILL — PROGESTERONE 100 MG CAPSULE: 100 | 30 days supply | Qty: 30 | Fill #7

## 2017-12-29 ENCOUNTER — Other Ambulatory Visit: Payer: Self-pay | Admitting: Internal Medicine

## 2017-12-29 MED FILL — predniSONE 10 MG TABS: 10 | 6 days supply | Qty: 21 | Fill #0

## 2017-12-29 NOTE — Telephone Encounter (Signed)
She said her lower back has been hurting "really bad" and she is going out of the country to [place] and she's going to be hiking and she just to "be in good shape when traveling" she said the celebrex isn't working and she really doesn't want any pain pills.

## 2017-12-29 NOTE — Telephone Encounter (Signed)
Please call and see why she needs this

## 2017-12-29 NOTE — Telephone Encounter (Signed)
Refill once 

## 2018-01-01 MED FILL — PROGESTERONE 100 MG CAPSULE: 100 | 30 days supply | Qty: 30 | Fill #8

## 2018-01-13 ENCOUNTER — Other Ambulatory Visit: Payer: Self-pay | Admitting: Internal Medicine

## 2018-01-13 MED FILL — CELECOXIB 200 MG CAP: 200 | 45 days supply | Qty: 90 | Fill #0

## 2018-01-13 MED FILL — DIVIGEL 0.5 MG GEL PACKET: 0.5 | 30 days supply | Qty: 30 | Fill #6

## 2018-01-13 MED FILL — ESCITALOPRAM 20 MG TABLET: 20 | 90 days supply | Qty: 90 | Fill #0

## 2018-02-02 MED FILL — PROGESTERONE 100 MG CAPSULE: 100 | 30 days supply | Qty: 30 | Fill #9

## 2018-02-10 ENCOUNTER — Other Ambulatory Visit: Payer: Self-pay | Admitting: Internal Medicine

## 2018-02-10 NOTE — Telephone Encounter (Signed)
Refill x 6 months 

## 2018-02-16 ENCOUNTER — Other Ambulatory Visit: Payer: Self-pay | Admitting: Internal Medicine

## 2018-02-16 MED FILL — OLOPATADINE HCL 0.1% EYE DR: 0.1 | 50 days supply | Qty: 5 | Fill #0

## 2018-02-16 MED FILL — ZOLPIDEM TARTRATE 10 MG TAB: 10 | 90 days supply | Qty: 90 | Fill #0

## 2018-02-23 MED FILL — DIVIGEL 0.5 MG GEL PACKET: 0.5 | 30 days supply | Qty: 30 | Fill #7

## 2018-03-09 MED FILL — CELECOXIB 200 MG CAP: 200 | 45 days supply | Qty: 90 | Fill #1

## 2018-03-09 MED FILL — PROGESTERONE 100 MG CAPSULE: 100 | 30 days supply | Qty: 30 | Fill #10

## 2018-04-01 MED FILL — DIVIGEL 0.5 MG GEL PACKET: 0.5 | 30 days supply | Qty: 30 | Fill #0

## 2018-04-02 MED FILL — PROGESTERONE 100 MG CAPSULE: 100 | 30 days supply | Qty: 30 | Fill #0

## 2018-04-03 ENCOUNTER — Ambulatory Visit (HOSPITAL_COMMUNITY)
Admission: RE | Admit: 2018-04-03 | Discharge: 2018-04-03 | Disposition: A | Discharge status: Home / Self Care | Payer: 59 | Source: Ambulatory Visit | Attending: Otolaryngology | Admitting: Otolaryngology

## 2018-04-03 ENCOUNTER — Telehealth: Payer: Self-pay | Admitting: Internal Medicine

## 2018-04-03 ENCOUNTER — Encounter: Payer: Self-pay | Admitting: Internal Medicine

## 2018-04-03 ENCOUNTER — Inpatient Hospital Stay (HOSPITAL_COMMUNITY): Admission: RE | Admit: 2018-04-03 | Payer: 59 | Source: Ambulatory Visit

## 2018-04-03 ENCOUNTER — Ambulatory Visit: Payer: 59 | Admitting: Internal Medicine

## 2018-04-03 ENCOUNTER — Other Ambulatory Visit: Payer: Self-pay | Admitting: Otolaryngology

## 2018-04-03 VITALS — BP 140/80 | HR 68 | Ht 64.0 in | Wt 148.6 lb

## 2018-04-03 DIAGNOSIS — J90 Pleural effusion, not elsewhere classified: Secondary | ICD-10-CM | POA: Diagnosis not present

## 2018-04-03 DIAGNOSIS — R059 Cough, unspecified: Secondary | ICD-10-CM

## 2018-04-03 DIAGNOSIS — R05 Cough: Secondary | ICD-10-CM | POA: Insufficient documentation

## 2018-04-03 DIAGNOSIS — J9811 Atelectasis: Secondary | ICD-10-CM

## 2018-04-03 MED ORDER — BUDESONIDE-FORMOTEROL FUMARATE 80-4.5 MCG/ACT IN AERO: 2.0000 | INHALATION_SPRAY | Freq: Two times a day (BID) | RESPIRATORY_TRACT | 0 refills | Status: DC

## 2018-04-03 MED ORDER — AZITHROMYCIN 250 MG PO TABS: ORAL_TABLET | ORAL | 0 refills | Status: DC

## 2018-04-03 MED ORDER — PREDNISONE 10 MG PO TABS
ORAL_TABLET | ORAL | 0 refills | Status: DC
Start: 2018-04-03 — End: 2018-12-03

## 2018-04-03 MED FILL — AZITHROMYCIN 250 MG TABLET: 250 | 5 days supply | Qty: 6 | Fill #0

## 2018-04-03 MED FILL — predniSONE 10 MG TABS: 10 | 6 days supply | Qty: 14 | Fill #0

## 2018-04-03 NOTE — Telephone Encounter (Signed)
Patient called and spoke with [name]; stated that she aspirated on rice on Thursday.  She spoke with Dr. Constance Holster today at the hospital and had a CXR which showed a pulmonary effusion.  Her symptoms are SOB, cough.  She wanted to know if she could be seen this afternoon because she is going out of town for 2 weeks.  Advised we would speak with Dr. Renold Genta and let her know.  She is just now calling because she was waiting on the results of the CXR.    She provided number for Dr. Constance Holster as [phone] and said Dr. Renold Genta could speak with him if she needed.    Patient then called back to advise that she has since been able to get an appointment with Dr. Melvyn Novas this afternoon.  Advised that Dr. Renold Genta has been with patients and we have not been able to speak with her yet.  So, patient is going to go ahead and see Dr. Melvyn Novas this afternoon.  Advised we will note her chart and make Dr. Renold Genta aware of this, but she will go ahead and see Dr. Melvyn Novas.  Wished her well.    Dr. Renold Genta, just an FYI.

## 2018-04-03 NOTE — Patient Instructions (Addendum)
symbicort 80 Take 2 puffs first thing in am and then another 2 puffs about 12 hours later.   Work on inhaler technique:  relax and gently blow all the way out then take a nice smooth deep breath back in, triggering the inhaler at same time you start breathing in.  Hold for up to 5 seconds if you can. Blow out thru nose. Rinse and gargle with water when done     Prednisone 10 mg take  4 each am x 2 days,   2 each am x 2 days,  1 each am x 2 days and stop   Zpak   Return Tuesday 04/07/18  For recheck

## 2018-04-03 NOTE — Progress Notes (Signed)
Robin Chaney, female    DOB: 10-Oct-1950, 67 y.o.   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 Acute Visit    Had a cxr today because she ate chicken fried rice and went to take a deep breath and started coughing so she had cxr today.   was in her usual state of health until choked while eating chicken fried rice 04/03/18 at lunch and since then having continuous severe coughing fits/ subjective wheeze with gen chest discomfort during coughing fits and mild doe but not sob at rest and no fever / min mucoid sputum   No obvious day to day or daytime variability or assoc excess/ purulent sputum or mucus plugs or hemoptysis or cp or chest tightness, subjective wheeze or overt sinus or hb symptoms.    Also denies any obvious fluctuation of symptoms with weather or environmental changes or other aggravating or alleviating factors except as outlined above   No unusual exposure hx or h/o childhood pna/ asthma or knowledge of premature birth.  Current Allergies, Complete Past Medical History, Past Surgical History, Family History, and Social History were reviewed in [employer] record.  ROS  The following are not active complaints unless bolded Hoarseness, sore throat, dysphagia, dental problems, itching, sneezing,  nasal congestion or discharge of excess mucus or purulent secretions, ear ache,   fever, chills, sweats, unintended wt loss or wt gain, classically pleuritic or exertional cp,  orthopnea pnd or arm/hand swelling  or leg swelling, presyncope, palpitations, abdominal pain, anorexia, nausea, vomiting, diarrhea  or change in bowel habits or change in bladder habits, change  in stools or change in urine, dysuria, hematuria,  rash, arthralgias, visual complaints, headache, numbness, weakness or ataxia or problems with walking or coordination,  change in mood or  memory.           Past Medical History:  Diagnosis Date  . Allergic conjunctivitis   . Allergic rhinitis     Outpatient Medications Prior to Visit  Medication Sig Dispense Refill  . aspirin-acetaminophen-caffeine (EXCEDRIN MIGRAINE) 250-250-65 MG per tablet Take 1 tablet by mouth every 6 (six) hours as needed.      Marland Kitchen BIOTIN 5000 PO Take by mouth.    . celecoxib (CELEBREX) 200 MG capsule TAKE 1 CAPSULE BY MOUTH 2 TIMES DAILY 90 capsule 1  . escitalopram (LEXAPRO) 20 MG tablet TAKE 1 TABLET BY MOUTH DAILY. 90 tablet 0  . ESTROGEL 0.75 MG/1.25 GM (0.06%) topical gel   11  . fexofenadine (ALLEGRA) 180 MG tablet Take 180 mg by mouth daily as needed.      Marland Kitchen olopatadine (PATANOL) 0.1 % ophthalmic solution PLACE 1 DROP IN EACH EYE ONCE DAILY FOR ALLERGY SYMPTOMS 5 mL PRN  . tretinoin (RETIN-A) 0.1 % cream APPLY TOPICALLY AT BEDTIME 45 g 1  . zolpidem (AMBIEN) 10 MG tablet TAKE 1 TABLET BY MOUTH AT BEDTIME 90 tablet 1  . meloxicam (MOBIC) 15 MG tablet Take 1 tablet (15 mg total) by mouth daily. 90 tablet 1  . metroNIDAZOLE (FLAGYL) 500 MG tablet Take 1 tablet by mouth twice a day until finished. PLEASE GIVE ALCOHOL PRECAUTIONS. 14 tablet 1  .  predniSONE (DELTASONE) 10 MG tablet TAKE AS DIRECTED ON INSTRUCTION SHEET 21 tablet 0  . progesterone (PROMETRIUM) 100 MG capsule      No facility-administered medications prior to visit.              Objective:     BP 140/80 (BP Location: Left Arm, Cuff Size: Normal)   Pulse 68   Ht 5\' 4"  (1.626 m)   Wt 148 lb 9.6 oz (67.4 kg)   SpO2 94%   BMI 25.51 kg/m   SpO2: 94 %  RA   amb pleasant non toxic wf nad    HEENT: nl dentition, turbinates bilaterally, and oropharynx. Nl external ear canals without cough reflex   NECK :  without JVD/Nodes/TM/ nl  carotid upstrokes bilaterally   LUNGS: no acc muscle use,  Nl contour chest with decreased bs R base without localized or gen wheezing   CV:  RRR  no s3 or murmur or increase in P2, and no edema   ABD:  soft and nontender with nl inspiratory excursion in the supine position. No bruits or organomegaly appreciated, bowel sounds nl  MS:  Nl gait/ ext warm without deformities, calf tenderness, cyanosis or clubbing No obvious joint restrictions   SKIN: warm and dry without lesions    NEURO:  alert, approp, nl sensorium with  no motor or cerebellar deficits apparent.   CXR PA and Lateral:   04/03/2018 :    I personally reviewed images and   impression as follows:    atx R base, no significant as dz or effusion    Assessment   No problem-specific Assessment & Plan notes found for this encounter.     Christinia Gully, MD 04/03/2018

## 2018-04-04 ENCOUNTER — Encounter: Payer: Self-pay | Admitting: Internal Medicine

## 2018-04-04 DIAGNOSIS — J9811 Atelectasis: Secondary | ICD-10-CM | POA: Insufficient documentation

## 2018-04-04 NOTE — Assessment & Plan Note (Addendum)
Onset of symptoms 04/02/18  p asp  @  lunch of fried rice  -  - The proper method of use, as well as anticipated side effects, of a metered-dose inhaler are discussed and demonstrated to the patient. Improved effectiveness after extensive coaching during this visit to a level of approximately 75 % from a baseline of 25 %     She most likely has retained FB induced atx distally and unlikely with rice that it would be retrievable with FOB so rx is purely symptomatic:  rx  symbicort 80 2bid to prevent reflex bronchospasm and worsen the atx or sob Prednisone 10 mg take  4 each am x 2 days,   2 each am x 2 days,  1 each am x 2 days and stop for airways inflammation related to FB Zpak for its anti-inflammatory properties - I assured her more potent abx are not warranted at this point  She declined any cough meds including recs for mucinex or mucinex dm   Would like to see her again in 3-4 days for repeat exam/ cxr - if not better can schedule for quick look fob 04/08/18 before the holidays   I had an extended discussion with the patient reviewing all relevant studies completed to date and  lasting 25 minutes of a 40  minute acute office visit with pt unknown to me    re  severe non-specific but potentially very serious refractory respiratory symptoms of uncertain and potentially multiple  etiologies.   Each maintenance medication was reviewed in detail including most importantly the difference between maintenance and prns and under what circumstances the prns are to be triggered using an action plan format that is not reflected in the computer generated alphabetically organized AVS.    See device teaching which extended face to face time for this visit   Please see AVS for specific instructions unique to this office visit that I personally wrote and verbalized to the the pt in detail and then reviewed with pt  by my nurse highlighting any changes in therapy/plan of care  recommended at today's visit.

## 2018-04-06 ENCOUNTER — Ambulatory Visit: Payer: 59 | Admitting: Adult Health

## 2018-04-06 ENCOUNTER — Ambulatory Visit (INDEPENDENT_AMBULATORY_CARE_PROVIDER_SITE_OTHER): Payer: 59 | Admitting: Internal Medicine

## 2018-04-06 ENCOUNTER — Ambulatory Visit (INDEPENDENT_AMBULATORY_CARE_PROVIDER_SITE_OTHER)
Admission: RE | Admit: 2018-04-06 | Discharge: 2018-04-06 | Disposition: A | Discharge status: Home / Self Care | Payer: 59 | Source: Ambulatory Visit | Attending: Internal Medicine | Admitting: Internal Medicine

## 2018-04-06 ENCOUNTER — Encounter: Payer: Self-pay | Admitting: Internal Medicine

## 2018-04-06 VITALS — BP 146/76 | HR 78 | Temp 97.9°F | Ht 64.0 in | Wt 148.8 lb

## 2018-04-06 DIAGNOSIS — J9811 Atelectasis: Secondary | ICD-10-CM | POA: Diagnosis not present

## 2018-04-06 DIAGNOSIS — R05 Cough: Secondary | ICD-10-CM | POA: Diagnosis not present

## 2018-04-06 MED ORDER — FAMOTIDINE 20 MG PO TABS: 20.0000 mg | ORAL_TABLET | Freq: Every day | ORAL | 2 refills | Status: DC

## 2018-04-06 MED ORDER — OMEPRAZOLE 20 MG PO CPDR: 20.0000 mg | DELAYED_RELEASE_CAPSULE | Freq: Every day | ORAL | 2 refills | Status: DC

## 2018-04-06 MED FILL — OMEPRAZOLE 20 MG CAP: 20 | 30 days supply | Qty: 30 | Fill #0

## 2018-04-06 NOTE — Progress Notes (Signed)
Thanks, Mike.

## 2018-04-06 NOTE — Progress Notes (Signed)
Robin Chaney, female    DOB: April 05, 1951, 67 y.o.   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 What year   self referred for acute evaluation for cough/ wheeze after aspirated chicken fried rice at lunch 04/02/18      04/03/2018  f/u ov/Mardella Nuckles re:  Chief Complaint  Patient presents with  . Acute Visit    Had a cxr today because she ate chicken fried rice and went to take a deep breath and started coughing so she had cxr today.   was in her usual state of health until choked while eating chicken fried rice 04/03/18 at lunch and since then having continuous severe coughing fits/ subjective wheeze with gen chest discomfort during coughing fits and mild doe but not sob at rest and no fever / min mucoid sputum rec symbicort 80 Take 2 puffs first thing in am and then another 2 puffs about 12 hours later.  Work on inhaler technique:  Prednisone 10 mg take  4 each am x 2 days,   2 each am x 2 days,  1 each am x 2 days and stop  Zpak     04/06/2018  f/u ov/Seraya Jobst re: cough/ atx p fb asp Chief Complaint  Patient presents with  . Follow-up    Breathing is unchanged and she is still coughing the same.   Dyspnea:  Room to room  Cough: dry hacking mostly  on insp with subjective wheeze/ abdominal wall discomfort from coughing Sleeping: can't sleep but not due to cough or sob SABA use: none    No obvious day to day or daytime variability or assoc excess/ purulent sputum or mucus plugs or hemoptysis or cp or chest tightness,  overt sinus or hb symptoms.   Sleeps flat  without nocturnal  or early am exacerbation  of respiratory  c/o's or need for noct saba. Also denies any obvious fluctuation of symptoms with weather or environmental changes or other aggravating or alleviating factors except as outlined above   No unusual exposure hx or h/o childhood  pna/ asthma or knowledge of premature birth.  Current Allergies, Complete Past Medical History, Past Surgical History, Family History, and Social History were reviewed in [employer] record.  ROS  The following are not active complaints unless bolded Hoarseness, sore throat, dysphagia, dental problems, itching, sneezing,  nasal congestion or discharge of excess mucus or purulent secretions, ear ache,   fever, chills, sweats, unintended wt loss or wt gain, classically pleuritic or exertional cp,  orthopnea pnd or arm/hand swelling  or leg swelling, presyncope, palpitations, abdominal pain, anorexia, nausea, vomiting, diarrhea  or change in bowel habits or change in bladder habits, change in stools or change in urine, dysuria, hematuria,  rash, arthralgias, visual complaints, headache, numbness, weakness or ataxia or problems with walking or coordination,  change in mood or  memory.        Current Meds  Medication Sig  . azithromycin (ZITHROMAX) 250 MG tablet Take 2 on day one then 1 daily x 4 days (Patient not taking: Reported on 04/06/2018)  . budesonide-formoterol (SYMBICORT) 80-4.5 MCG/ACT inhaler Inhale 2 puffs into the lungs 2 (two) times daily.  . celecoxib (CELEBREX) 200 MG capsule TAKE 1 CAPSULE BY MOUTH 2 TIMES DAILY  . escitalopram (LEXAPRO) 20 MG tablet TAKE 1 TABLET BY MOUTH DAILY.  Marland Kitchen  ESTROGEL 0.75 MG/1.25 GM (0.06%) topical gel   . fexofenadine (ALLEGRA) 180 MG tablet Take 180 mg by mouth daily as needed for allergies.   Marland Kitchen olopatadine (PATANOL) 0.1 % ophthalmic solution PLACE 1 DROP IN [facility] EYE ONCE DAILY FOR ALLERGY SYMPTOMS  . predniSONE (DELTASONE) 10 MG tablet Take  4 each am x 2 days,   2 each am x 2 days,  1 each am x 2 days and stop  . tretinoin (RETIN-A) 0.1 % cream APPLY TOPICALLY AT BEDTIME  . zolpidem (AMBIEN) 10 MG tablet TAKE 1 TABLET BY MOUTH AT BEDTIME (Patient taking differently: Take 10 mg by mouth at bedtime as needed for sleep)                  Objective:    Wt Readings from Last 3 Encounters:  04/06/18 148 lb 12.8 oz (67.5 kg)  04/03/18 148 lb 9.6 oz (67.4 kg)  11/27/17 149 lb (67.6 kg)     Vital signs reviewed - Note on arrival 02 sats  93% on RA      HEENT: nl dentition, turbinates bilaterally, and oropharynx. Nl external ear canals without cough reflex   NECK :  without JVD/Nodes/TM/ nl carotid upstrokes bilaterally   LUNGS: no acc muscle use,  Nl contour chest  With decrease bs R base and dry sounding cough on deep inspiration  CV:  RRR  no s3 or murmur or increase in P2, and no edema   ABD:  soft and nontender with nl inspiratory excursion in the supine position. No bruits or organomegaly appreciated, bowel sounds nl  MS:  Nl gait/ ext warm without deformities, calf tenderness, cyanosis or clubbing No obvious joint restrictions   SKIN: warm and dry without lesions    NEURO:  alert, approp, nl sensorium with  no motor or cerebellar deficits apparent.       CXR PA and Lateral:   04/06/2018 :    I personally reviewed images and agree with radiology impression as follows:     Right base scarring or chronic atelectasis, similar to prior study    Assessment

## 2018-04-06 NOTE — Patient Instructions (Addendum)
Come to outpatient registration at [facility] (behind the ER) at 615 amTuesday 04/07/18  with nothing to eat or drink after midnight Monday .for Bronchoscopy   Try prilosec otc 20mg   Take 30-60 min before first meal of the day and Pepcid ac (famotidine) 20 mg one @  bedtime until cough is completely gone for at least a week without the need for cough suppression     GERD (REFLUX)  is an extremely common cause of respiratory symptoms just like yours , many times with no obvious heartburn at all.    It can be treated with medication, but also with lifestyle changes including elevation of the head of your bed (ideally with 6 inch  bed blocks),  Smoking cessation, avoidance of late meals, excessive alcohol, and avoid fatty foods, chocolate, peppermint, colas, red wine, and acidic juices such as orange juice.  NO MINT OR MENTHOL PRODUCTS SO NO COUGH DROPS  USE SUGARLESS CANDY INSTEAD (Jolley ranchers or Stover's or Life Savers) or even ice chips will also do - the key is to swallow to prevent all throat clearing. NO OIL BASED VITAMINS - use powdered substitutes.    For cough >  mucinex dm up to 1200 mg every 12 hours as needed with glass of water

## 2018-04-06 NOTE — H&P (Signed)
Robin Chaney, female    DOB: 10-20-1950, 67 y.o.   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 What year   self referred for acute evaluation for cough/ wheeze after aspirated chicken fried rice at lunch 04/02/18      04/03/2018  f/u ov/Robin Chaney re:      Chief Complaint  Patient presents with  . Acute Visit    Had a cxr today because she ate chicken fried rice and went to take a deep breath and started coughing so she had cxr today.   was in her usual state of health until choked while eating chicken fried rice 04/03/18 at lunch and since then having continuous severe coughing fits/ subjective wheeze with gen chest discomfort during coughing fits and mild doe but not sob at rest and no fever / min mucoid sputum rec symbicort 80 Take 2 puffs first thing in am and then another 2 puffs about 12 hours later.  Work on inhaler technique:  Prednisone 10 mg take  4 each am x 2 days,   2 each am x 2 days,  1 each am x 2 days and stop  Zpak     04/06/2018  f/u ov/Robin Chaney re: cough/ atx p fb asp     Chief Complaint  Patient presents with  . Follow-up    Breathing is unchanged and she is still coughing the same.   Dyspnea:  Room to room  Cough: dry hacking mostly  on insp with subjective wheeze/ abdominal wall discomfort from coughing Sleeping: can't sleep but not due to cough or sob SABA use: none    No obvious day to day or daytime variability or assoc excess/ purulent sputum or mucus plugs or hemoptysis or cp or chest tightness,  overt sinus or hb symptoms.   Sleeps flat  without nocturnal  or early am exacerbation  of respiratory  c/o's or need for noct saba. Also denies any obvious fluctuation of symptoms with weather or environmental changes or other aggravating or alleviating factors except as outlined above   No unusual exposure  hx or h/o childhood pna/ asthma or knowledge of premature birth.  Current Allergies, Complete Past Medical History, Past Surgical History, Family History, and Social History were reviewed in [employer] record.  ROS  The following are not active complaints unless bolded Hoarseness, sore throat, dysphagia, dental problems, itching, sneezing,  nasal congestion or discharge of excess mucus or purulent secretions, ear ache,   fever, chills, sweats, unintended wt loss or wt gain, classically pleuritic or exertional cp,  orthopnea pnd or arm/hand swelling  or leg swelling, presyncope, palpitations, abdominal pain, anorexia, nausea, vomiting, diarrhea  or change in bowel habits or change in bladder habits, change in stools or change in urine, dysuria, hematuria,  rash, arthralgias, visual complaints, headache, numbness, weakness or ataxia or problems with walking or coordination,  change in mood or  memory.            Current Meds  Medication Sig  . azithromycin (ZITHROMAX) 250 MG tablet Take 2 on day one then 1 daily x 4 days (Patient not taking: Reported on 04/06/2018)  . budesonide-formoterol (SYMBICORT) 80-4.5 MCG/ACT inhaler Inhale 2 puffs into the lungs 2 (two) times daily.  . celecoxib (CELEBREX) 200 MG capsule TAKE 1 CAPSULE BY MOUTH 2 TIMES DAILY  . escitalopram (LEXAPRO)  20 MG tablet TAKE 1 TABLET BY MOUTH DAILY.  Marland Kitchen ESTROGEL 0.75 MG/1.25 GM (0.06%) topical gel   . fexofenadine (ALLEGRA) 180 MG tablet Take 180 mg by mouth daily as needed for allergies.   Marland Kitchen olopatadine (PATANOL) 0.1 % ophthalmic solution PLACE 1 DROP IN [facility] EYE ONCE DAILY FOR ALLERGY SYMPTOMS  . predniSONE (DELTASONE) 10 MG tablet Take  4 each am x 2 days,   2 each am x 2 days,  1 each am x 2 days and stop  . tretinoin (RETIN-A) 0.1 % cream APPLY TOPICALLY AT BEDTIME  . zolpidem (AMBIEN) 10 MG tablet TAKE 1 TABLET BY MOUTH AT BEDTIME (Patient taking differently: Take 10 mg by mouth at bedtime as needed  for sleep)                 Objective:       Wt Readings from Last 3 Encounters:  04/06/18 148 lb 12.8 oz (67.5 kg)  04/03/18 148 lb 9.6 oz (67.4 kg)  11/27/17 149 lb (67.6 kg)     Vital signs reviewed - Note on arrival 02 sats  93% on RA      HEENT: nl dentition, turbinates bilaterally, and oropharynx. Nl external ear canals without cough reflex   NECK :  without JVD/Nodes/TM/ nl carotid upstrokes bilaterally   LUNGS: no acc muscle use,  Nl contour chest  With decrease bs R base and dry sounding cough on deep inspiration  CV:  RRR  no s3 or murmur or increase in P2, and no edema   ABD:  soft and nontender with nl inspiratory excursion in the supine position. No bruits or organomegaly appreciated, bowel sounds nl  MS:  Nl gait/ ext warm without deformities, calf tenderness, cyanosis or clubbing No obvious joint restrictions   SKIN: warm and dry without lesions    NEURO:  alert, approp, nl sensorium with  no motor or cerebellar deficits apparent.       CXR PA and Lateral:   04/06/2018 :    I personally reviewed images and agree with radiology impression as follows:     Right base scarring or chronic atelectasis, similar to prior study    Assessment             Assessment & Plan Note by Tanda Rockers, MD at 04/06/2018 1:04 PM   Author: Tanda Rockers, MD Author Type: Physician Filed: 04/06/2018 1:07 PM  Note Status: Bernell List: Cosign Not Required Encounter Date: 04/06/2018  Problem: Focal atelectasis R base p apparent aspiration of FB (rice)   Editor: Tanda Rockers, MD (Physician)  Prior Versions: 1. Tanda Rockers, MD (Physician) at 04/06/2018 1:06 PM - Written    Onset of symptoms 04/02/18  p asp  @  lunch of fried rice  - FOB 04/07/18 recommended  Not able to cough up any foreign bodies and no change on exam or cxr so reasonable take a look with fob to sure subsegmental airway are clear of debris and finish course of zmax/  prednisone/ continue symbicort 80 2bid and add mucinex dm at this point.  Also add gerd rx to help prevent cyclical cough.   Discussed in detail all the  indications, usual  risks and alternatives  relative to the benefits with patient who agrees to proceed with bronchoscopy with biopsy.   I had an extended discussion with the patient reviewing all relevant studies completed to date and  lasting 15 to 20 minutes of a 25  minute visit    Each maintenance medication was reviewed in detail including most importantly the difference between maintenance and prns and under what circumstances the prns are to be triggered using an action plan format that is not reflected in the computer generated alphabetically organized AVS.    Please see AVS for specific instructions unique to this visit that I personally wrote and verbalized to the the pt in detail and then reviewed with pt  by my nurse highlighting any  changes in therapy recommended at today's visit to their plan of care.         Patient Instructions by Tanda Rockers, MD at 04/06/2018 10:45 AM   Author: Tanda Rockers, MD Author Type: Physician Filed: 04/06/2018 11:33 AM  Note Status: Addendum Cosign: Cosign Not Required Encounter Date: 04/06/2018  Editor: Tanda Rockers, MD (Physician)  Prior Versions: 1. Tanda Rockers, MD (Physician) at 04/06/2018 11:33 AM - Addendum   2. Tanda Rockers, MD (Physician) at 04/06/2018 11:29 AM - Signed    Come to outpatient registration at [facility] (behind the ER) at 615 amTuesday 04/07/18  with nothing to eat or drink after midnight Monday .for Bronchoscopy   Try prilosec otc 20mg   Take 30-60 min before first meal of the day and Pepcid ac (famotidine) 20 mg one @  bedtime until cough is completely gone for at least a week without the need for cough suppression     GERD (REFLUX)  is an extremely common cause of respiratory symptoms just like yours , many times with no obvious heartburn at all.     It can be treated with medication, but also with lifestyle changes including elevation of the head of your bed (ideally with 6 inch  bed blocks),  Smoking cessation, avoidance of late meals, excessive alcohol, and avoid fatty foods, chocolate, peppermint, colas, red wine, and acidic juices such as orange juice.  NO MINT OR MENTHOL PRODUCTS SO NO COUGH DROPS  USE SUGARLESS CANDY INSTEAD (Jolley ranchers or Stover's or Life Savers) or even ice chips will also do - the key is to swallow to prevent all throat clearing. NO OIL BASED VITAMINS - use powdered substitutes.    For cough >  mucinex dm up to 1200 mg every 12 hours as needed with glass of water         04/07/2018   Day of FOB: no change hx or exam   Christinia Gully, MD Pulmonary and [place] [phone] After 5:30 PM or weekends, use [facility] [phone]

## 2018-04-06 NOTE — Assessment & Plan Note (Addendum)
Onset of symptoms 04/02/18  p asp  @  lunch of fried rice  - FOB 04/07/18 recommended  Not able to cough up any foreign bodies and no change on exam or cxr so reasonable take a look with fob to sure subsegmental airway are clear of debris and finish course of zmax/ prednisone/ continue symbicort 80 2bid and add mucinex dm at this point.  Also add gerd rx to help prevent cyclical cough.   Discussed in detail all the  indications, usual  risks and alternatives  relative to the benefits with patient who agrees to proceed with bronchoscopy with biopsy.   I had an extended discussion with the patient reviewing all relevant studies completed to date and  lasting 15 to 20 minutes of a 25 minute visit    Each maintenance medication was reviewed in detail including most importantly the difference between maintenance and prns and under what circumstances the prns are to be triggered using an action plan format that is not reflected in the computer generated alphabetically organized AVS.    Please see AVS for specific instructions unique to this visit that I personally wrote and verbalized to the the pt in detail and then reviewed with pt  by my nurse highlighting any  changes in therapy recommended at today's visit to their plan of care.

## 2018-04-07 ENCOUNTER — Ambulatory Visit (HOSPITAL_COMMUNITY)
Admission: RE | Admit: 2018-04-07 | Discharge: 2018-04-07 | Disposition: A | Discharge status: Home / Self Care | Payer: 59 | Source: Ambulatory Visit | Attending: Internal Medicine | Admitting: Internal Medicine

## 2018-04-07 ENCOUNTER — Encounter (HOSPITAL_COMMUNITY): Payer: Self-pay | Admitting: Respiratory Therapy

## 2018-04-07 ENCOUNTER — Encounter (HOSPITAL_COMMUNITY)
Admission: RE | Disposition: A | Discharge status: Home / Self Care | Payer: Self-pay | Source: Ambulatory Visit | Attending: Internal Medicine

## 2018-04-07 DIAGNOSIS — X58XXXA Exposure to other specified factors, initial encounter: Secondary | ICD-10-CM | POA: Insufficient documentation

## 2018-04-07 DIAGNOSIS — R05 Cough: Secondary | ICD-10-CM | POA: Insufficient documentation

## 2018-04-07 DIAGNOSIS — T17828A Food in other parts of respiratory tract causing other injury, initial encounter: Secondary | ICD-10-CM | POA: Insufficient documentation

## 2018-04-07 DIAGNOSIS — R1310 Dysphagia, unspecified: Secondary | ICD-10-CM | POA: Insufficient documentation

## 2018-04-07 DIAGNOSIS — R0600 Dyspnea, unspecified: Secondary | ICD-10-CM | POA: Insufficient documentation

## 2018-04-07 DIAGNOSIS — J9811 Atelectasis: Secondary | ICD-10-CM | POA: Diagnosis present

## 2018-04-07 DIAGNOSIS — Z79899 Other long term (current) drug therapy: Secondary | ICD-10-CM | POA: Insufficient documentation

## 2018-04-07 DIAGNOSIS — Z7951 Long term (current) use of inhaled steroids: Secondary | ICD-10-CM | POA: Diagnosis not present

## 2018-04-07 HISTORY — PX: VIDEO BRONCHOSCOPY: SHX5072

## 2018-04-07 SURGERY — VIDEO BRONCHOSCOPY WITHOUT FLUORO
Anesthesia: Moderate Sedation | Laterality: Bilateral

## 2018-04-07 MED ORDER — MIDAZOLAM HCL 5 MG/ML IJ SOLN: 1.0000 mg | Freq: Once | INTRAMUSCULAR | Status: DC

## 2018-04-07 MED ORDER — MEPERIDINE HCL 100 MG/ML IJ SOLN: 100.0000 mg | Freq: Once | INTRAMUSCULAR | Status: DC

## 2018-04-07 MED ORDER — LIDOCAINE HCL 2 % EX GEL
1.0000 "application " | Freq: Once | CUTANEOUS | Status: DC
  Filled 2018-04-07: qty 5

## 2018-04-07 MED ORDER — LIDOCAINE HCL URETHRAL/MUCOSAL 2 % EX GEL
CUTANEOUS | Status: DC | PRN
  Administered 2018-04-07: 1

## 2018-04-07 MED ORDER — PHENYLEPHRINE HCL 0.25 % NA SOLN: 1.0000 | Freq: Four times a day (QID) | NASAL | Status: DC | PRN

## 2018-04-07 MED ORDER — MEPERIDINE HCL 25 MG/ML IJ SOLN
INTRAMUSCULAR | Status: DC | PRN
  Administered 2018-04-07 (×2): 25 mg via INTRAVENOUS

## 2018-04-07 MED ORDER — MIDAZOLAM HCL 10 MG/2ML IJ SOLN
INTRAMUSCULAR | Status: DC | PRN
  Administered 2018-04-07 (×2): 2.5 mg via INTRAVENOUS

## 2018-04-07 MED ORDER — LIDOCAINE HCL (PF) 1 % IJ SOLN
INTRAMUSCULAR | Status: DC | PRN
  Administered 2018-04-07: 6 mL

## 2018-04-07 MED ORDER — MEPERIDINE HCL 100 MG/ML IJ SOLN
INTRAMUSCULAR | Status: AC
  Filled 2018-04-07: qty 2

## 2018-04-07 MED ORDER — MIDAZOLAM HCL 5 MG/ML IJ SOLN
INTRAMUSCULAR | Status: AC
  Filled 2018-04-07: qty 2

## 2018-04-07 MED ORDER — PHENYLEPHRINE HCL 0.25 % NA SOLN
NASAL | Status: DC | PRN
  Administered 2018-04-07: 2 via NASAL

## 2018-04-07 MED ORDER — SODIUM CHLORIDE 0.9 % IV SOLN
INTRAVENOUS | Status: DC
  Administered 2018-04-07: 07:00:00 via INTRAVENOUS

## 2018-04-07 NOTE — Progress Notes (Signed)
Video bronchoscopy performed Airway exam & foreign body aspiration removal No interventions Pt tolerated well  Illene Dike RRT

## 2018-04-07 NOTE — Discharge Instructions (Signed)
Flexible Bronchoscopy, Care After These instructions give you information on caring for yourself after your procedure. Your doctor may also give you more specific instructions. Call your doctor if you have any problems or questions after your procedure. Follow these instructions at home:  Do not eat or drink anything for 2 hours after your procedure. If you try to eat or drink before the medicine wears off, food or drink could go into your lungs. You could also burn yourself.  After 2 hours have passed and when you can cough and gag normally, you may eat soft food and drink liquids slowly.  The day after the test, you may eat your normal diet.  You may do your normal activities.  Keep all doctor visits. Get help right away if:  You get more and more short of breath.  You get light-headed.  You feel like you are going to pass out (faint).  You have chest pain.  You have new problems that worry you.  You cough up more than a little blood.  You cough up more blood than before. This information is not intended to replace advice given to you by your health care provider. Make sure you discuss any questions you have with your health care provider. Document Released: 07/21/2009 Document Revised: 02/29/2016 Document Reviewed: 05/28/2013 Elsevier Interactive Patient Education  2017 [place] not eat or drink until after 9:30 today 04/07/18

## 2018-04-07 NOTE — Op Note (Signed)
Bronchoscopy Procedure Note  Date of Operation: 04/07/2018   Pre-op Diagnosis: fb asp  Post-op Diagnosis: same (green pea) > removed from airway ? Swallowed?   Surgeon: Christinia Gully  Anesthesia: Monitored Local Anesthesia with Sedation Time Started: 0722 versed totalof 5 mg IV / demerol 50 g IV Time Stopped:  0740  Operation: Video Flexible fiberoptic bronchoscopy, diagnostic and therapeutic   Findings: green pea lodged in RLL just beyond RML orifice  Specimen: none (see below)  Estimated Blood Loss: none  Complications: none  Indications and History: See updated H and P same date. The risks, benefits, complications, treatment options and expected outcomes were discussed with the patient.  The possibilities of reaction to medication, pulmonary aspiration, perforation of a viscus, bleeding, failure to diagnose a condition and creating a complication requiring transfusion or operation were discussed with the patient who freely signed the consent.    Description of Procedure: The patient was re-examined in the bronchoscopy suite and the site of surgery properly noted/marked.  The patient was identified  and the procedure verified as Flexible Fiberoptic Bronchoscopy.  A Time Out was held and the above information confirmed.   After the induction of topical nasopharyngeal anesthesia, the patient was positioned  and the bronchoscope was passed through the R naris. The vocal cords were visualized and  1% buffered lidocaine 5 ml was topically placed onto the cords. The cords were nl. The scope was then passed into the trachea.  1% buffered lidocaine given topically. Airways inspected bilaterally to the subsegmental level with the following findings:  Green pea  obst RLL orifice beyond the RML / Sup segment orifices    Interventions: could not suction the pea out but able to hook it with the scope and pull it up to above the cords and then yankauer suction applied but could not retrieve and  pushed it below the cords where pt appeared to swallow it as could not locate it in either pyriform sinus    The Patient was taken to the Endoscopy Recovery area in satisfactory condition.  Attestation: I performed the procedure.  Christinia Gully, MD Pulmonary and [place] [phone] After 5:30 PM or weekends, call [phone]

## 2018-04-10 ENCOUNTER — Encounter (HOSPITAL_COMMUNITY): Payer: Self-pay | Admitting: Internal Medicine

## 2018-04-30 ENCOUNTER — Other Ambulatory Visit: Payer: Self-pay | Admitting: Internal Medicine

## 2018-04-30 MED FILL — CELECOXIB 200 MG CAP: 200 | 45 days supply | Qty: 90 | Fill #0

## 2018-04-30 MED FILL — DIVIGEL 0.5 MG GEL PACKET: 0.5 | 30 days supply | Qty: 30 | Fill #0

## 2018-04-30 MED FILL — PROGESTERONE 100 MG CAPSULE: 100 | 30 days supply | Qty: 30 | Fill #0

## 2018-05-15 MED FILL — ZOLPIDEM TARTRATE 10 MG TAB: 10 | 90 days supply | Qty: 90 | Fill #1

## 2018-05-20 MED FILL — METHOCARBAMOL 500 MG TABS: 500 | 8 days supply | Qty: 30 | Fill #0

## 2018-05-20 MED FILL — metroNIDAZOLE 500 MG TABS: 500 | 7 days supply | Qty: 14 | Fill #1

## 2018-05-20 MED FILL — ENOXAPARIN 40 MG/0.4 ML SYR: 40 | 12 days supply | Qty: 5 | Fill #0

## 2018-05-20 MED FILL — HYDROmorphone HCL 2 MG TABS: 2 | 3 days supply | Qty: 20 | Fill #0

## 2018-05-28 DIAGNOSIS — Z6826 Body mass index (BMI) 26.0-26.9, adult: Secondary | ICD-10-CM | POA: Diagnosis not present

## 2018-05-28 DIAGNOSIS — N760 Acute vaginitis: Secondary | ICD-10-CM | POA: Diagnosis not present

## 2018-05-28 DIAGNOSIS — Z01419 Encounter for gynecological examination (general) (routine) without abnormal findings: Secondary | ICD-10-CM | POA: Diagnosis not present

## 2018-05-28 DIAGNOSIS — Z1231 Encounter for screening mammogram for malignant neoplasm of breast: Secondary | ICD-10-CM | POA: Diagnosis not present

## 2018-06-05 MED FILL — PROGESTERONE 100 MG CAPSULE: 100 | 30 days supply | Qty: 30 | Fill #0

## 2018-06-24 MED FILL — METHOCARBAMOL 500 MG TABS: 500 | 8 days supply | Qty: 30 | Fill #1

## 2018-06-25 DIAGNOSIS — M5442 Lumbago with sciatica, left side: Secondary | ICD-10-CM | POA: Diagnosis not present

## 2018-06-25 DIAGNOSIS — M546 Pain in thoracic spine: Secondary | ICD-10-CM | POA: Diagnosis not present

## 2018-06-25 DIAGNOSIS — M47816 Spondylosis without myelopathy or radiculopathy, lumbar region: Secondary | ICD-10-CM | POA: Diagnosis not present

## 2018-06-25 DIAGNOSIS — M5441 Lumbago with sciatica, right side: Secondary | ICD-10-CM | POA: Diagnosis not present

## 2018-06-25 DIAGNOSIS — M549 Dorsalgia, unspecified: Secondary | ICD-10-CM | POA: Diagnosis not present

## 2018-06-25 DIAGNOSIS — M5416 Radiculopathy, lumbar region: Secondary | ICD-10-CM | POA: Diagnosis not present

## 2018-06-25 DIAGNOSIS — M5136 Other intervertebral disc degeneration, lumbar region: Secondary | ICD-10-CM | POA: Diagnosis not present

## 2018-06-25 DIAGNOSIS — G8929 Other chronic pain: Secondary | ICD-10-CM | POA: Diagnosis not present

## 2018-07-06 ENCOUNTER — Other Ambulatory Visit: Payer: Self-pay | Admitting: Internal Medicine

## 2018-07-06 MED FILL — ESCITALOPRAM 20 MG TABLET: 20 | 90 days supply | Qty: 90 | Fill #0

## 2018-07-06 MED FILL — PREMARIN VAGINAL CREAM-APPL: 0.625 | 70 days supply | Qty: 30 | Fill #0

## 2018-07-08 ENCOUNTER — Other Ambulatory Visit: Payer: Self-pay | Admitting: Internal Medicine

## 2018-07-08 MED FILL — TRETINOIN 0.1% CREAM: 0.1 | 90 days supply | Qty: 20 | Fill #0

## 2018-07-29 MED FILL — CELECOXIB 200 MG CAP: 200 | 45 days supply | Qty: 90 | Fill #1

## 2018-08-10 ENCOUNTER — Other Ambulatory Visit: Payer: Self-pay | Admitting: Internal Medicine

## 2018-08-10 NOTE — Telephone Encounter (Signed)
Needs appt Feb for CPE before refilling

## 2018-08-11 MED FILL — ZOLPIDEM TARTRATE 10 MG TAB: 10 | 90 days supply | Qty: 90 | Fill #0

## 2018-08-12 DIAGNOSIS — H2513 Age-related nuclear cataract, bilateral: Secondary | ICD-10-CM | POA: Diagnosis not present

## 2018-08-12 DIAGNOSIS — H04123 Dry eye syndrome of bilateral lacrimal glands: Secondary | ICD-10-CM | POA: Diagnosis not present

## 2018-08-12 DIAGNOSIS — H43812 Vitreous degeneration, left eye: Secondary | ICD-10-CM | POA: Diagnosis not present

## 2018-08-12 DIAGNOSIS — H25013 Cortical age-related cataract, bilateral: Secondary | ICD-10-CM | POA: Diagnosis not present

## 2018-09-09 ENCOUNTER — Other Ambulatory Visit: Payer: Self-pay | Admitting: Internal Medicine

## 2018-09-09 MED FILL — CELECOXIB 200 MG CAP: 200 | 45 days supply | Qty: 90 | Fill #0

## 2018-09-09 MED FILL — OLOPATADINE HCL 0.1% EYE DR: 0.1 | 50 days supply | Qty: 5 | Fill #1

## 2018-09-18 MED FILL — AMOX-CLAV 875-125 MG TABLET: 875-125 | 10 days supply | Qty: 20 | Fill #0

## 2018-11-09 ENCOUNTER — Other Ambulatory Visit: Payer: Self-pay | Admitting: Internal Medicine

## 2018-11-09 MED FILL — ZOLPIDEM TARTRATE 10 MG TAB: 10 | 90 days supply | Qty: 90 | Fill #0

## 2018-11-18 ENCOUNTER — Other Ambulatory Visit: Payer: Self-pay | Admitting: Internal Medicine

## 2018-11-18 MED FILL — CELECOXIB 200 MG CAP: 200 | 45 days supply | Qty: 90 | Fill #1 | Status: TO

## 2018-11-19 MED FILL — ESCITALOPRAM 20 MG TABLET: 20 | 90 days supply | Qty: 90 | Fill #0

## 2018-12-02 ENCOUNTER — Other Ambulatory Visit (INDEPENDENT_AMBULATORY_CARE_PROVIDER_SITE_OTHER): Payer: 59 | Admitting: Internal Medicine

## 2018-12-02 DIAGNOSIS — G4700 Insomnia, unspecified: Secondary | ICD-10-CM | POA: Diagnosis not present

## 2018-12-02 DIAGNOSIS — Z0000 Encounter for general adult medical examination without abnormal findings: Secondary | ICD-10-CM | POA: Diagnosis not present

## 2018-12-02 DIAGNOSIS — E7800 Pure hypercholesterolemia, unspecified: Secondary | ICD-10-CM | POA: Diagnosis not present

## 2018-12-02 DIAGNOSIS — E559 Vitamin D deficiency, unspecified: Secondary | ICD-10-CM | POA: Diagnosis not present

## 2018-12-02 LAB — POCT URINALYSIS DIPSTICK
APPEARANCE: NEGATIVE
Bilirubin, UA: NEGATIVE
Blood, UA: NEGATIVE
Glucose, UA: NEGATIVE
Ketones, UA: NEGATIVE
Leukocytes, UA: NEGATIVE
NITRITE UA: NEGATIVE
ODOR: NEGATIVE
PROTEIN UA: NEGATIVE
Spec Grav, UA: 1.01 (ref 1.010–1.025)
Urobilinogen, UA: 0.2 E.U./dL
pH, UA: 6.5 (ref 5.0–8.0)

## 2018-12-02 NOTE — Addendum Note (Signed)
Addended by: Mady Haagensen on: 12/02/2018 09:42 AM   Modules accepted: Orders

## 2018-12-03 ENCOUNTER — Ambulatory Visit (INDEPENDENT_AMBULATORY_CARE_PROVIDER_SITE_OTHER): Payer: 59 | Admitting: Internal Medicine

## 2018-12-03 ENCOUNTER — Encounter: Payer: Self-pay | Admitting: Internal Medicine

## 2018-12-03 VITALS — BP 110/80 | HR 67 | Ht 64.0 in | Wt 147.0 lb

## 2018-12-03 DIAGNOSIS — R232 Flushing: Secondary | ICD-10-CM | POA: Diagnosis not present

## 2018-12-03 DIAGNOSIS — F419 Anxiety disorder, unspecified: Secondary | ICD-10-CM

## 2018-12-03 DIAGNOSIS — F32A Depression, unspecified: Secondary | ICD-10-CM

## 2018-12-03 DIAGNOSIS — Z23 Encounter for immunization: Secondary | ICD-10-CM | POA: Diagnosis not present

## 2018-12-03 DIAGNOSIS — G4700 Insomnia, unspecified: Secondary | ICD-10-CM

## 2018-12-03 DIAGNOSIS — Z0000 Encounter for general adult medical examination without abnormal findings: Secondary | ICD-10-CM | POA: Diagnosis not present

## 2018-12-03 DIAGNOSIS — Z8739 Personal history of other diseases of the musculoskeletal system and connective tissue: Secondary | ICD-10-CM

## 2018-12-03 DIAGNOSIS — Z9882 Breast implant status: Secondary | ICD-10-CM

## 2018-12-03 DIAGNOSIS — Z8669 Personal history of other diseases of the nervous system and sense organs: Secondary | ICD-10-CM

## 2018-12-03 DIAGNOSIS — Z7184 Encounter for health counseling related to travel: Secondary | ICD-10-CM | POA: Diagnosis not present

## 2018-12-03 DIAGNOSIS — E559 Vitamin D deficiency, unspecified: Secondary | ICD-10-CM | POA: Diagnosis not present

## 2018-12-03 DIAGNOSIS — F329 Major depressive disorder, single episode, unspecified: Secondary | ICD-10-CM

## 2018-12-03 DIAGNOSIS — Z8709 Personal history of other diseases of the respiratory system: Secondary | ICD-10-CM

## 2018-12-03 LAB — LIPID PANEL
CHOLESTEROL: 233 mg/dL — AB (ref <200)
HDL: 56 mg/dL (ref >=50)
LDL Cholesterol (Calc): 150 mg/dL (calc) — ABNORMAL HIGH
Non-HDL Cholesterol (Calc): 177 mg/dL (calc) — ABNORMAL HIGH (ref <130)
Total CHOL/HDL Ratio: 4.2 (calc) (ref <5.0)
Triglycerides: 145 mg/dL (ref <150)

## 2018-12-03 LAB — COMPLETE METABOLIC PANEL WITH GFR
AG Ratio: 1.8 (calc) (ref 1.0–2.5)
ALBUMIN MSPROF: 4.1 g/dL (ref 3.6–5.1)
ALKALINE PHOSPHATASE (APISO): 81 U/L (ref 37–153)
ALT: 21 U/L (ref 6–29)
AST: 21 U/L (ref 10–35)
BUN: 19 mg/dL (ref 7–25)
CHLORIDE: 108 mmol/L (ref 98–110)
CO2: 26 mmol/L (ref 20–32)
CREATININE: 0.62 mg/dL (ref 0.50–0.99)
Calcium: 9.3 mg/dL (ref 8.6–10.4)
GFR, Est African American: 107 mL/min/{1.73_m2} (ref >=60)
GFR, Est Non African American: 93 mL/min/{1.73_m2} (ref >=60)
Globulin: 2.3 g/dL (calc) (ref 1.9–3.7)
Glucose, Bld: 88 mg/dL (ref 65–99)
POTASSIUM: 4.9 mmol/L (ref 3.5–5.3)
SODIUM: 142 mmol/L (ref 135–146)
Total Bilirubin: 0.4 mg/dL (ref 0.2–1.2)
Total Protein: 6.4 g/dL (ref 6.1–8.1)

## 2018-12-03 LAB — CBC WITH DIFFERENTIAL/PLATELET
Absolute Monocytes: 422 cells/uL (ref 200–950)
BASOS PCT: 1.2 %
Basophils Absolute: 49 cells/uL (ref 0–200)
Eosinophils Absolute: 160 cells/uL (ref 15–500)
Eosinophils Relative: 3.9 %
HCT: 42.2 % (ref 35.0–45.0)
HEMOGLOBIN: 14.3 g/dL (ref 11.7–15.5)
Lymphs Abs: 1525 cells/uL (ref 850–3900)
MCH: 30.9 pg (ref 27.0–33.0)
MCHC: 33.9 g/dL (ref 32.0–36.0)
MCV: 91.1 fL (ref 80.0–100.0)
MONOS PCT: 10.3 %
MPV: 10.3 fL (ref 7.5–12.5)
NEUTROS ABS: 1943 {cells}/uL (ref 1500–7800)
Neutrophils Relative %: 47.4 %
PLATELETS: 394 10*3/uL (ref 140–400)
RBC: 4.63 10*6/uL (ref 3.80–5.10)
RDW: 13 % (ref 11.0–15.0)
TOTAL LYMPHOCYTE: 37.2 %
WBC: 4.1 10*3/uL (ref 3.8–10.8)

## 2018-12-03 LAB — VITAMIN D 25 HYDROXY (VIT D DEFICIENCY, FRACTURES): Vit D, 25-Hydroxy: 29 ng/mL — ABNORMAL LOW (ref 30–100)

## 2018-12-03 LAB — TSH: TSH: 3.51 m[IU]/L (ref 0.40–4.50)

## 2018-12-03 MED ORDER — CIPROFLOXACIN HCL 500 MG PO TABS: 500.0000 mg | ORAL_TABLET | Freq: Two times a day (BID) | ORAL | 0 refills | Status: DC

## 2018-12-03 MED FILL — CIPROFLOXACIN HCL 500 MG TA: 500 | 10 days supply | Qty: 20 | Fill #0

## 2018-12-03 NOTE — Progress Notes (Signed)
Subjective:    Patient ID: Robin Chaney, female    DOB: January 14, 1951, 68 y.o.   MRN: [id number]  HPI 68 year old female in today for health maintenance exam and evaluation of medical issues.  Order given for Shingrix.  Cannot see that she has had Prevnar 13 in the past and this was given today.  She is going to [place].  I have prescribed Cipro for her to take for travel.  Continues to have issues with back pain and takes Celebrex.  No known drug allergies.  Dr. Julien Girt is GYN physician.  She has been taken off of estrogen replacement.  She is allergic to trees and pollen.  History of pneumonia as an adult and as a young child.  History of poison ivy June 2010.  Has had breast implants twice.  Had D&C at [facility] for menorrhagia.  Recently had abdominal liposuction by Dr. Harlow Mares.  Had colonoscopy by Dr. Watt Climes in 2008.  Have not seen follow-up that she needs to contact him about that.  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  Says she will be going on Medicare in the near future.  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.  She has a college degree.  She is divorced.  No children.  Does not smoke.  Social alcohol consumption.  Family history: Father died at age 27 of lung cancer.  Mother died at age 70 of an MI with history of peripheral vascular disease of breast cancer.  2 brothers 1 of whom has hypertension.  One sister in good health.  In July 2019 she underwent fiberoptic bronchoscopy by Dr. Melvyn Novas and a green pea was found lodged in her right lower lobe.  This apparently had been and some rice that she was eating a few days previously.  She she had developed a cough afterwards with some shortness of breath.  Symptoms resolved after PT was removed.    Review of Systems  Constitutional: Negative.   Respiratory: Negative.   Cardiovascular: Negative.   Gastrointestinal: Negative.   Genitourinary: Negative.    Neurological: Negative.        Objective:   Physical Exam Vitals signs reviewed.  Constitutional:      Appearance: Normal appearance.  HENT:     Head: Normocephalic and atraumatic.     Right Ear: Tympanic membrane normal.     Left Ear: Tympanic membrane normal.     Nose: Nose normal.     Mouth/Throat:     Mouth: Mucous membranes are moist.     Pharynx: Oropharynx is clear.  Eyes:     General: No scleral icterus.       Right eye: No discharge.        Left eye: No discharge.     Pupils: Pupils are equal, round, and reactive to light.  Neck:     Musculoskeletal: Neck supple.     Vascular: No carotid bruit.     Comments: No thyromegaly Cardiovascular:     Rate and Rhythm: Normal rate and regular rhythm.     Heart sounds: No murmur.     Comments: Bilateral breast implants Pulmonary:     Effort: Pulmonary effort is normal. No respiratory distress.     Breath sounds: Normal breath sounds.  Abdominal:     General: There is no distension.     Palpations: Abdomen is soft. There is no mass.     Tenderness: There  is no abdominal tenderness. There is no rebound.  Genitourinary:    Comments: Deferred to GYN physician Musculoskeletal:     Right lower leg: No edema.     Left lower leg: No edema.  Lymphadenopathy:     Cervical: No cervical adenopathy.  Skin:    General: Skin is warm and dry.  Neurological:     General: No focal deficit present.     Mental Status: She is alert and oriented to person, place, and time.     Cranial Nerves: No cranial nerve deficit.     Motor: No weakness.     Coordination: Coordination normal.  Psychiatric:        Mood and Affect: Mood normal.        Behavior: Behavior normal.        Thought Content: Thought content normal.        Judgment: Judgment normal.           Assessment & Plan:  Shift workers insomnia-takes Ambien  Vitamin D deficiency-level is 29-take 2000 units daily of vitamin D3  Musculoskeletal pain-treated with  Celebrex  Anxiety and mild depression-trial of Lexapro 10 mg daily  Allergic rhinitis for which she takes Allegra  Travel advice encounter-antibiotics prescribed for trip to [facility] flashes- recommend no treatment.  Agree with GYN regarding coming off of estrogen replacement  Plan: Return in 1 year or as needed.  If Lexapro is not working she is to let me know.  Take vitamin D 2000 units daily.  Okay to refill Ambien for 6 months.  Prevnar given.  Prescription for Cipro for travel

## 2018-12-03 NOTE — Patient Instructions (Signed)
Take 2000-4000 units vitamin D3 daily.  Watch diet and exercise.  Return in 6 months for fasting lipid panel and office visit.  Cipro called in for travel to [place] if needed if needed.  Okay to refill Ambien for 6 months.  Prevnar 13 given.

## 2018-12-21 ENCOUNTER — Encounter: Payer: Self-pay | Admitting: Internal Medicine

## 2018-12-22 ENCOUNTER — Encounter: Payer: Self-pay | Admitting: Internal Medicine

## 2019-01-11 ENCOUNTER — Other Ambulatory Visit: Payer: Self-pay | Admitting: Internal Medicine

## 2019-01-11 MED FILL — OLOPATADINE HCL 0.1 % SOLN: 0.1 | 50 days supply | Qty: 5 | Fill #0

## 2019-01-13 ENCOUNTER — Telehealth: Payer: Self-pay | Admitting: Internal Medicine

## 2019-01-13 MED FILL — CELECOXIB 200 MG CAPSULE: 200 | 90 days supply | Qty: 180 | Fill #0

## 2019-01-13 NOTE — Telephone Encounter (Signed)
Call from patient regarding Celebrex prescription.  She is at pharmacy now.  She has been taking 200 mg Celebrex twice daily.  Recent prescription only was for 90 days and 1 capsule daily.  Have clarified with pharmacist this should be twice daily with as needed 1 year refills.

## 2019-02-04 MED FILL — ZOLPIDEM TARTRATE 10 MG TAB: 10 | 90 days supply | Qty: 90 | Fill #0

## 2019-02-26 MED FILL — AMOX-CLAV 875-125 MG TABLET: 875-125 | 10 days supply | Qty: 20 | Fill #0

## 2019-04-29 MED FILL — AMOX-CLAV 875-125 MG TABLET: 875-125 | 10 days supply | Qty: 20 | Fill #0

## 2019-05-03 ENCOUNTER — Other Ambulatory Visit: Payer: Self-pay

## 2019-05-05 ENCOUNTER — Encounter: Payer: Self-pay | Admitting: Internal Medicine

## 2019-05-05 ENCOUNTER — Telehealth: Payer: Self-pay | Admitting: Internal Medicine

## 2019-05-05 DIAGNOSIS — G4700 Insomnia, unspecified: Secondary | ICD-10-CM

## 2019-05-05 MED ORDER — ZOLPIDEM TARTRATE 10 MG PO TABS: 10.0000 mg | ORAL_TABLET | Freq: Every day | ORAL | 1 refills | Status: DC

## 2019-05-05 MED FILL — ZOLPIDEM TARTRATE 10 MG TAB: 10 | 90 days supply | Qty: 90 | Fill #0

## 2019-05-05 NOTE — Telephone Encounter (Signed)
Refill Ambien x 6 months. Due for CPE Feb 2021. Controlled substance refilled personally by [email or web address]

## 2019-05-26 ENCOUNTER — Other Ambulatory Visit: Payer: Self-pay

## 2019-05-26 MED ORDER — CELECOXIB 200 MG PO CAPS: 200.0000 mg | ORAL_CAPSULE | Freq: Two times a day (BID) | ORAL | 1 refills | Status: DC

## 2019-05-26 NOTE — Telephone Encounter (Signed)
Patient called to request a refill on CELEBREX, last filled in April. She has her CPE scheduled in March 2021.

## 2019-05-31 ENCOUNTER — Other Ambulatory Visit: Payer: Self-pay | Admitting: Internal Medicine

## 2019-05-31 MED ORDER — ESCITALOPRAM OXALATE 20 MG PO TABS: 20.0000 mg | ORAL_TABLET | Freq: Every day | ORAL | 0 refills | Status: DC

## 2019-05-31 NOTE — Telephone Encounter (Signed)
Robin Chaney [phone]  [facility]  escitalopram (LEXAPRO) 20 MG tablet   [name] called to say she needs a refill sent to [facility] for the above medication, this is new pharmacy for her.

## 2019-05-31 NOTE — Telephone Encounter (Signed)
CPE scheduled  

## 2019-06-07 DIAGNOSIS — N951 Menopausal and female climacteric states: Secondary | ICD-10-CM | POA: Diagnosis not present

## 2019-06-09 MED FILL — ESTRADIOL 0.5 MG TABLET: 0.5 | 90 days supply | Qty: 90 | Fill #0

## 2019-06-09 MED FILL — PROGESTERONE 100 MG CAPSULE: 100 | 90 days supply | Qty: 90 | Fill #0

## 2019-07-26 ENCOUNTER — Other Ambulatory Visit: Payer: Self-pay | Admitting: Internal Medicine

## 2019-07-26 DIAGNOSIS — R87810 Cervical high risk human papillomavirus (HPV) DNA test positive: Secondary | ICD-10-CM | POA: Diagnosis not present

## 2019-07-26 DIAGNOSIS — Z1231 Encounter for screening mammogram for malignant neoplasm of breast: Secondary | ICD-10-CM | POA: Diagnosis not present

## 2019-07-26 DIAGNOSIS — Z6827 Body mass index (BMI) 27.0-27.9, adult: Secondary | ICD-10-CM | POA: Diagnosis not present

## 2019-07-26 DIAGNOSIS — Z779 Other contact with and (suspected) exposures hazardous to health: Secondary | ICD-10-CM | POA: Diagnosis not present

## 2019-07-26 MED FILL — AMOX-CLAV 875-125 MG TABLET: 875-125 | 10 days supply | Qty: 20 | Fill #1

## 2019-08-05 MED FILL — ZOLPIDEM TARTRATE 10 MG TAB: 10 | 90 days supply | Qty: 90 | Fill #1

## 2019-08-12 DIAGNOSIS — N870 Mild cervical dysplasia: Secondary | ICD-10-CM | POA: Diagnosis not present

## 2019-08-12 DIAGNOSIS — R87612 Low grade squamous intraepithelial lesion on cytologic smear of cervix (LGSIL): Secondary | ICD-10-CM | POA: Diagnosis not present

## 2019-08-31 ENCOUNTER — Telehealth: Payer: Self-pay

## 2019-08-31 ENCOUNTER — Encounter: Payer: Self-pay | Admitting: Internal Medicine

## 2019-08-31 ENCOUNTER — Ambulatory Visit (INDEPENDENT_AMBULATORY_CARE_PROVIDER_SITE_OTHER): Payer: Medicare PPO | Admitting: Internal Medicine

## 2019-08-31 VITALS — Ht 64.0 in | Wt 147.0 lb

## 2019-08-31 DIAGNOSIS — J22 Unspecified acute lower respiratory infection: Secondary | ICD-10-CM

## 2019-08-31 MED ORDER — HYDROCODONE-HOMATROPINE 5-1.5 MG/5ML PO SYRP: 5.0000 mL | ORAL_SOLUTION | Freq: Four times a day (QID) | ORAL | 0 refills | Status: DC | PRN

## 2019-08-31 MED ORDER — PREDNISONE 10 MG PO TABS: ORAL_TABLET | ORAL | 0 refills | Status: DC

## 2019-08-31 MED ORDER — AZITHROMYCIN 250 MG PO TABS: ORAL_TABLET | ORAL | 0 refills | Status: DC

## 2019-08-31 MED FILL — AZITHROMYCIN 250 MG TABLET: 250 | 5 days supply | Qty: 6 | Fill #0

## 2019-08-31 MED FILL — predniSONE 10 MG TABS: 10 | 6 days supply | Qty: 21 | Fill #0

## 2019-08-31 MED FILL — HYDROCODONE-HOMATROPINE SOL: 5-1.5 | 6 days supply | Qty: 120 | Fill #0

## 2019-08-31 NOTE — Telephone Encounter (Signed)
Scheduled virtual visit °

## 2019-08-31 NOTE — Telephone Encounter (Signed)
Patient went for COVID testing today, she has a cough, sinus pain, chest tightness results will be back within 48 hours. She wants to know if you can call her an antibiotic.    Call [phone]

## 2019-08-31 NOTE — Telephone Encounter (Signed)
Needs virtual visit 

## 2019-08-31 NOTE — Progress Notes (Signed)
   Subjective:    Patient ID: Robin Chaney, female    DOB: [birth year], 68 y.o.   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  Has had head cold type symptoms.  No known COVID-19 exposure.  Has been raking leaves and went to some bonfires.  No fever.  No productive cough.  Chest has been tight.  Has Symbicort and ProAir inhalers.  Working part-time.  No headache or myalgias.  Due to the Coronavirus pandemic she is seen today by interactive audio and video telecommunications.  She is identified using 2 identifiers as Robin Chaney, a patient in this practice.  She is agreeable to visit in this format today.    Review of Systems no nausea vomiting or diarrhea     Objective:   Physical Exam Reports no fever.  Seen virtually but technical difficulties were present and we finished interview via telephone.       Assessment & Plan:  Acute lower respiratory infection  Plan: Recommend COVID-19 testing.  Started on Zithromax Z-PAK 2 tabs p.o. day 1 followed by 1 p.o. days 2 through 5.  Use Symbicort and proair inhalers.  Prescribed tapering course of prednisone going from 60 mg to 0 mg over 7 days.  Hycodan 1 teaspoon p.o. every 8 hours as needed cough.  Rest and stay at home and avoid contact until COVID-19 test result is back.  Stay out of work until cleared by [facility] [employer]

## 2019-09-03 NOTE — Patient Instructions (Signed)
Use inhalers as prescribed.  Take Zithromax Z-PAK as directed and Hycodan as needed for cough.  Take tapering course of prednisone.  Rest and stay at home.  Recommend COVID-19 testing.

## 2019-09-14 NOTE — Telephone Encounter (Signed)
She will need virtual visit tomorrow

## 2019-09-14 NOTE — Telephone Encounter (Signed)
Robin Chaney called to say she stills has a persistent dry cough, she is taking the hydrocodone at night to sleep. She did start the prednisone yesterday that you gave her if she started wheezing. She is not wheezing just has the persistent cough. She is able to take deep breaths now, however her chest hurts from coughing. Is there anything else she can do.

## 2019-09-14 NOTE — Telephone Encounter (Signed)
Scheduled

## 2019-09-15 ENCOUNTER — Other Ambulatory Visit: Payer: Self-pay

## 2019-09-15 ENCOUNTER — Encounter: Payer: Self-pay | Admitting: Internal Medicine

## 2019-09-15 ENCOUNTER — Ambulatory Visit (INDEPENDENT_AMBULATORY_CARE_PROVIDER_SITE_OTHER): Payer: Medicare PPO | Admitting: Internal Medicine

## 2019-09-15 VITALS — Ht 64.0 in | Wt 147.0 lb

## 2019-09-15 DIAGNOSIS — R05 Cough: Secondary | ICD-10-CM

## 2019-09-15 DIAGNOSIS — R053 Chronic cough: Secondary | ICD-10-CM

## 2019-09-15 MED ORDER — HYDROCODONE-HOMATROPINE 5-1.5 MG/5ML PO SYRP: 5.0000 mL | ORAL_SOLUTION | Freq: Three times a day (TID) | ORAL | 0 refills | Status: DC | PRN

## 2019-09-15 MED FILL — HYDROCODONE-HOMATROPINE SOL: 5-1.5 | 8 days supply | Qty: 120 | Fill #0

## 2019-09-15 NOTE — Progress Notes (Signed)
   Subjective:    Patient ID: Robin Chaney, female    DOB: 1951/08/27, 68 y.o.   MRN: [id number]  HPI Patient had negative Covid 19 test at test center reportedly on November 24.  This report is not in [place].  On the same day, she was seen by virtual visit here for cough and chest congestion.  She has a history of allergies to trees and pollen.  History of pneumonia as an adult and as a young child.  When seen virtually on November 24 she was diagnosed with acute lower respiratory infection.  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  Indeed her test was negative for COVID-19.  She improved slightly but is not completely gotten over cough and congestion.  At that time she was treated with Zithromax, Hycodan and a tapering course of prednisone.  She was advised to use Symbicort and ProAir inhalers.  Has continued to cough and coworkers are concerned about her.  She has no fever or chills.  No nausea, vomiting, dysgeusia or headache.  No myalgias.  Today she is once again seen by interactive audio and video telecommunications due to the coronavirus pandemic.  She is agreeable to visit in this format today and is identified using 2 identifiers as [name], a patient in this practice.   Review of Systems see above     Objective:   Physical Exam Reports that she is afebrile.  Seen virtually in no acute distress at her home. She is not heard to be coughing repetitively during the interview.  Does not appear to be tachypneic and has no audible wheezing on this interview.       Assessment & Plan:  Protracted lower respiratory infection  Plan: I have refilled Hycodan 1 teaspoon p.o. every 8 hours as needed cough.  If she does not improve, we will need to consider chest x-ray and further evaluation.  She is to call if not better in 2 weeks or sooner if worse.  We have agreed that she will not have another COVID-19 test at this point in  time.

## 2019-09-26 NOTE — Patient Instructions (Signed)
Hycodan has been refilled to take sparingly for cough.  If not improving within 2 weeks, will need chest x-ray and further evaluation.

## 2019-11-02 ENCOUNTER — Other Ambulatory Visit: Payer: Self-pay | Admitting: Internal Medicine

## 2019-11-04 MED FILL — ZOLPIDEM TARTRATE 10 MG TAB: 10 | 90 days supply | Qty: 90 | Fill #0

## 2019-12-02 ENCOUNTER — Telehealth: Payer: Self-pay | Admitting: Internal Medicine

## 2019-12-02 NOTE — Telephone Encounter (Signed)
Robin Chaney [phone]  Robin Chaney called to say she has been in [place] and got bad sun poisoning and she now has a few blister on the top of her lip and she is getting one on her nose. She would like for you to call her in some Valtrex. She has lab appointment in the morning and CPE on Monday 12/06/2019. I let her know she may need OV or phone call since she has not had refill on this medication since 03/2016

## 2019-12-02 NOTE — Telephone Encounter (Signed)
After speaking with Dr Renold Genta she wants patient to come in and be seen today.

## 2019-12-02 NOTE — Telephone Encounter (Signed)
This is a new problem so I would need to see her. I think you made that clear.

## 2019-12-02 NOTE — Telephone Encounter (Signed)
Called patient to let her know that Dr Renold Genta wants her to come in for OV today, that she did not need to wait to be seen for fever blisters, needs to be seen get on medication as soon as possible. I offered appointments for this afternoon and she stated she would wait until Monday, because she did not want to py for 2 office visits.

## 2019-12-03 ENCOUNTER — Other Ambulatory Visit: Payer: Self-pay

## 2019-12-03 ENCOUNTER — Other Ambulatory Visit: Payer: Medicare PPO | Admitting: Internal Medicine

## 2019-12-03 DIAGNOSIS — G4700 Insomnia, unspecified: Secondary | ICD-10-CM

## 2019-12-03 DIAGNOSIS — Z0000 Encounter for general adult medical examination without abnormal findings: Secondary | ICD-10-CM | POA: Diagnosis not present

## 2019-12-03 DIAGNOSIS — F329 Major depressive disorder, single episode, unspecified: Secondary | ICD-10-CM

## 2019-12-03 DIAGNOSIS — F419 Anxiety disorder, unspecified: Secondary | ICD-10-CM | POA: Diagnosis not present

## 2019-12-03 DIAGNOSIS — E785 Hyperlipidemia, unspecified: Secondary | ICD-10-CM

## 2019-12-03 DIAGNOSIS — F32A Depression, unspecified: Secondary | ICD-10-CM

## 2019-12-03 DIAGNOSIS — E569 Vitamin deficiency, unspecified: Secondary | ICD-10-CM | POA: Diagnosis not present

## 2019-12-03 DIAGNOSIS — E7800 Pure hypercholesterolemia, unspecified: Secondary | ICD-10-CM

## 2019-12-04 LAB — CBC WITH DIFFERENTIAL/PLATELET
Absolute Monocytes: 437 cells/uL (ref 200–950)
Basophils Absolute: 61 cells/uL (ref 0–200)
Basophils Relative: 1.3 %
Eosinophils Absolute: 221 cells/uL (ref 15–500)
Eosinophils Relative: 4.7 %
HCT: 38.6 % (ref 35.0–45.0)
Hemoglobin: 13.2 g/dL (ref 11.7–15.5)
Lymphs Abs: 1278 cells/uL (ref 850–3900)
MCH: 31.4 pg (ref 27.0–33.0)
MCHC: 34.2 g/dL (ref 32.0–36.0)
MCV: 91.7 fL (ref 80.0–100.0)
MPV: 10.4 fL (ref 7.5–12.5)
Monocytes Relative: 9.3 %
Neutro Abs: 2703 cells/uL (ref 1500–7800)
Neutrophils Relative %: 57.5 %
Platelets: 333 10*3/uL (ref 140–400)
RBC: 4.21 10*6/uL (ref 3.80–5.10)
RDW: 12.7 % (ref 11.0–15.0)
Total Lymphocyte: 27.2 %
WBC: 4.7 10*3/uL (ref 3.8–10.8)

## 2019-12-04 LAB — LIPID PANEL
Cholesterol: 181 mg/dL (ref <200)
HDL: 50 mg/dL (ref >=50)
LDL Cholesterol (Calc): 97 mg/dL (calc)
Non-HDL Cholesterol (Calc): 131 mg/dL (calc) — ABNORMAL HIGH (ref <130)
Total CHOL/HDL Ratio: 3.6 (calc) (ref <5.0)
Triglycerides: 226 mg/dL — ABNORMAL HIGH (ref <150)

## 2019-12-04 LAB — COMPLETE METABOLIC PANEL WITH GFR
AG Ratio: 2 (calc) (ref 1.0–2.5)
ALT: 17 U/L (ref 6–29)
AST: 19 U/L (ref 10–35)
Albumin: 4 g/dL (ref 3.6–5.1)
Alkaline phosphatase (APISO): 68 U/L (ref 37–153)
BUN: 22 mg/dL (ref 7–25)
CO2: 23 mmol/L (ref 20–32)
Calcium: 8.6 mg/dL (ref 8.6–10.4)
Chloride: 105 mmol/L (ref 98–110)
Creat: 0.58 mg/dL (ref 0.50–0.99)
GFR, Est African American: 109 mL/min/{1.73_m2} (ref >=60)
GFR, Est Non African American: 94 mL/min/{1.73_m2} (ref >=60)
Globulin: 2 g/dL (calc) (ref 1.9–3.7)
Glucose, Bld: 90 mg/dL (ref 65–99)
Potassium: 4.3 mmol/L (ref 3.5–5.3)
Sodium: 137 mmol/L (ref 135–146)
Total Bilirubin: 0.3 mg/dL (ref 0.2–1.2)
Total Protein: 6 g/dL — ABNORMAL LOW (ref 6.1–8.1)

## 2019-12-04 LAB — TSH: TSH: 2.22 mIU/L (ref 0.40–4.50)

## 2019-12-06 ENCOUNTER — Ambulatory Visit (INDEPENDENT_AMBULATORY_CARE_PROVIDER_SITE_OTHER): Payer: Medicare PPO | Admitting: Internal Medicine

## 2019-12-06 ENCOUNTER — Other Ambulatory Visit: Payer: Self-pay

## 2019-12-06 ENCOUNTER — Encounter: Payer: Self-pay | Admitting: Internal Medicine

## 2019-12-06 VITALS — BP 130/80 | HR 72 | Temp 98.0°F | Ht 63.0 in | Wt 153.0 lb

## 2019-12-06 DIAGNOSIS — Z0000 Encounter for general adult medical examination without abnormal findings: Secondary | ICD-10-CM | POA: Diagnosis not present

## 2019-12-06 DIAGNOSIS — F419 Anxiety disorder, unspecified: Secondary | ICD-10-CM

## 2019-12-06 DIAGNOSIS — G4700 Insomnia, unspecified: Secondary | ICD-10-CM | POA: Diagnosis not present

## 2019-12-06 DIAGNOSIS — Z8739 Personal history of other diseases of the musculoskeletal system and connective tissue: Secondary | ICD-10-CM | POA: Diagnosis not present

## 2019-12-06 DIAGNOSIS — Z8709 Personal history of other diseases of the respiratory system: Secondary | ICD-10-CM

## 2019-12-06 DIAGNOSIS — F32A Depression, unspecified: Secondary | ICD-10-CM

## 2019-12-06 DIAGNOSIS — Z9882 Breast implant status: Secondary | ICD-10-CM | POA: Diagnosis not present

## 2019-12-06 DIAGNOSIS — F329 Major depressive disorder, single episode, unspecified: Secondary | ICD-10-CM

## 2019-12-06 DIAGNOSIS — K219 Gastro-esophageal reflux disease without esophagitis: Secondary | ICD-10-CM

## 2019-12-06 LAB — POCT URINALYSIS DIPSTICK
Appearance: NEGATIVE
Bilirubin, UA: NEGATIVE
Blood, UA: NEGATIVE
Glucose, UA: NEGATIVE
Ketones, UA: NEGATIVE
Leukocytes, UA: NEGATIVE
Nitrite, UA: NEGATIVE
Odor: NEGATIVE
Protein, UA: NEGATIVE
Spec Grav, UA: 1.01 (ref 1.010–1.025)
Urobilinogen, UA: 0.2 E.U./dL
pH, UA: 6.5 (ref 5.0–8.0)

## 2019-12-06 MED ORDER — VALACYCLOVIR HCL 500 MG PO TABS: 500.0000 mg | ORAL_TABLET | Freq: Two times a day (BID) | ORAL | 5 refills | Status: AC

## 2019-12-06 MED FILL — VALACYCLOVIR HCL 500 MG TAB: 500 | 5 days supply | Qty: 10 | Fill #0

## 2019-12-06 NOTE — Progress Notes (Signed)
Subjective:    Patient ID: Robin Chaney, female    DOB: [birth year], 69 y.o.   MRN: [id number]  HPI 69 year old Female for Welcome to Medicare physical exam, health maintenance exam and evaluation of medical issues.  Still working part-time as a Marine scientist at [employer].  Travels some.  Will order recently.  No known COVID-19 exposure and has had vaccine x 2.  No known drug allergies.  Dr. Julien Girt is GYN physician.  She has been taking off estrogen replacement.  She had cervical and endocervical biopsy recently showing no malignancy.  History of pneumonia and asthma adult and is a young child.  Poison ivy June 2010.  Has had breast implants twice.  History of D&C at [facility] for menorrhagia.  Had abdominal liposuction by Dr. Harlow Mares.  Colonoscopy by Dr. Watt Climes in 2008.  Had follow-up study 2018 showing scattered diverticula. Colon Prep was poor so 5-year follow-up recommended.  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  In July 2019 she underwent fiberoptic bronchoscopy by Dr. Melvyn Novas and a green pea was found lodged in her right lower lobe.  Apparently had been eating some fried rice a few days previously and aspirated the green pea Developed cough and shortness of breath.  Symptoms resolved after the pea was removed.  She has a history of allergic rhinitis and sometimes wheezes.  Social history: She has a Gaffer.  She is divorced.  No children.  Does not smoke.  Social alcohol consumption.  Family history: Father died at age 29 of lung cancer.  Mother died at age 59 of an MI with history of peripheral vascular disease and breast cancer.  2 brothers, 1 of whom has hypertension.  1 sister in good health.      Review of Systems  Constitutional: Negative.   Respiratory:       History of allergic rhinitis  Cardiovascular: Negative.   Gastrointestinal: Negative.   Genitourinary: Negative.   Musculoskeletal: Positive for back pain and  myalgias.       Back pain and myalgias related to work as a Marine scientist  Neurological: Negative.   Psychiatric/Behavioral: Negative.        Objective:   Physical Exam Vitals reviewed.  Constitutional:      General: She is not in acute distress. HENT:     Right Ear: Tympanic membrane normal.     Left Ear: Tympanic membrane normal.     Nose: Nose normal.  Eyes:     General:        Right eye: No discharge.        Left eye: No discharge.     Conjunctiva/sclera: Conjunctivae normal.     Pupils: Pupils are equal, round, and reactive to light.  Neck:     Vascular: No carotid bruit.     Comments: No thyromegaly Cardiovascular:     Rate and Rhythm: Normal rate and regular rhythm.     Heart sounds: Normal heart sounds. No murmur.  Pulmonary:     Effort: Pulmonary effort is normal. No respiratory distress.     Breath sounds: Normal breath sounds. No stridor. No rales.  Abdominal:     General: Bowel sounds are normal.     Palpations: Abdomen is soft. There is no mass.     Tenderness: There is no abdominal tenderness. There is no guarding.  Genitourinary:    Comments: Deferred to GYN Musculoskeletal:     Cervical back: No  rigidity.     Right lower leg: No edema.     Left lower leg: No edema.  Lymphadenopathy:     Cervical: No cervical adenopathy.  Skin:    General: Skin is warm.  Neurological:     General: No focal deficit present.     Mental Status: She is alert and oriented to person, place, and time.     Cranial Nerves: No cranial nerve deficit.     Sensory: No sensory deficit.     Coordination: Coordination normal.     Gait: Gait normal.  Psychiatric:        Mood and Affect: Mood normal.        Behavior: Behavior normal.        Thought Content: Thought content normal.        Judgment: Judgment normal.           Assessment & Plan:  Shift workers insomnia-takes Ambien  History of vitamin D deficiency-Medicare will not pay for vitamin D level so this was not checked  with this visit.  Continue over-the-counter vitamin D supplement  History of musculoskeletal pain-related to work treated with Celebrex  Anxiety and mild depression treated with Lexapro  Allergic rhinitis treated with Allegra and Patanol eyedrops  GE reflux treated with Prilosec  Plan: Return in 1 year or as needed.  Subjective:   Patient presents for Medicare Annual/Subsequent preventive examination.  Review Past Medical/Family/Social: See above   Risk Factors  Current exercise habits: Stays in shape Dietary issues discussed: Low-fat low carbohydrate  Cardiac risk factors: Family history of MI in mother  Depression Screen  (Note: if answer to either of the following is "Yes", a more complete depression screening is indicated)   Over the past two weeks, have you felt down, depressed or hopeless? No  Over the past two weeks, have you felt little interest or pleasure in doing things? No Have you lost interest or pleasure in daily life? No Do you often feel hopeless? No Do you cry easily over simple problems? No   Activities of Daily Living  In your present state of health, do you have any difficulty performing the following activities?:   Driving? No  Managing money? No  Feeding yourself? No  Getting from bed to chair? No  Climbing a flight of stairs? No  Preparing food and eating?: No  Bathing or showering? No  Getting dressed: No  Getting to the toilet? No  Using the toilet:No  Moving around from place to place: No  In the past year have you fallen or had a near fall?:No  Are you sexually active? yes Do you have more than one partner? No   Hearing Difficulties: No  Do you often ask people to speak up or repeat themselves? No  Do you experience ringing or noises in your ears? No  Do you have difficulty understanding soft or whispered voices? No  Do you feel that you have a problem with memory?  Occasionally Do you often misplace items? No    Home Safety:  Do  you have a smoke alarm at your residence? Yes Do you have grab bars in the bathroom?  None Do you have throw rugs in your house?  No   Cognitive Testing  Alert? Yes Normal Appearance?Yes  Oriented to person? Yes Place? Yes  Time? Yes  Recall of three objects? Yes  Can perform simple calculations? Yes  Displays appropriate judgment?Yes  Can read the correct time from a watch  face?Yes   List the Names of Other Physician/Practitioners you currently use:  See referral list for the physicians patient is currently seeing.     Review of Systems: See above   Objective:     General appearance: Appears younger than stated age  Head: Normocephalic, without obvious abnormality, atraumatic  Eyes: conj clear, EOMi PEERLA  Ears: normal TM's and external ear canals both ears  Nose: Nares normal. Septum midline. Mucosa normal. No drainage or sinus tenderness.  Throat: lips, mucosa, and tongue normal; teeth and gums normal  Neck: no adenopathy, no carotid bruit, no JVD, supple, symmetrical, trachea midline and thyroid not enlarged, symmetric, no tenderness/mass/nodules  No CVA tenderness.  Lungs: clear to auscultation bilaterally  Breasts: normal appearance, no masses or tenderness Heart: regular rate and rhythm, S1, S2 normal, no murmur, click, rub or gallop  Abdomen: soft, non-tender; bowel sounds normal; no masses, no organomegaly  Musculoskeletal: ROM normal in all joints, no crepitus, no deformity, Normal muscle strengthen. Back  is symmetric, no curvature. Skin: Skin color, texture, turgor normal. No rashes or lesions  Lymph nodes: Cervical, supraclavicular, and axillary nodes normal.  Neurologic: CN 2 -12 Normal, Normal symmetric reflexes. Normal coordination and gait  Psych: Alert & Oriented x 3, Mood appear stable.    Assessment:    Annual wellness medicare exam   Plan:    During the course of the visit the patient was educated and counseled about appropriate screening and  preventive services including:   Annual flu vaccine  Has had COVID-19 vaccines  Has had pneumococcal 19 November 2018 and pneumococcal 23 2018.     Patient Instructions (the written plan) was given to the patient.  Medicare Attestation  I have personally reviewed:  The patient's medical and social history  Their use of alcohol, tobacco or illicit drugs  Their current medications and supplements  The patient's functional ability including ADLs,fall risks, home safety risks, cognitive, and hearing and visual impairment  Diet and physical activities  Evidence for depression or mood disorders  The patient's weight, height, BMI, and visual acuity have been recorded in the chart. I have made referrals, counseling, and provided education to the patient based on review of the above and I have provided the patient with a written personalized care plan for preventive services.       BMI 27 and stable

## 2019-12-09 ENCOUNTER — Other Ambulatory Visit: Payer: Self-pay | Admitting: Internal Medicine

## 2019-12-23 MED FILL — VALACYCLOVIR HCL 500 MG TAB: 500 | 5 days supply | Qty: 10 | Fill #0

## 2020-01-02 ENCOUNTER — Encounter: Payer: Self-pay | Admitting: Internal Medicine

## 2020-01-02 NOTE — Patient Instructions (Signed)
It was a pleasure to see you today.  Continue current medications and follow-up in 1 year or as needed.

## 2020-01-18 ENCOUNTER — Other Ambulatory Visit: Payer: Self-pay

## 2020-01-18 ENCOUNTER — Other Ambulatory Visit: Payer: Self-pay | Admitting: Internal Medicine

## 2020-01-18 MED ORDER — OLOPATADINE HCL 0.1 % OP SOLN: OPHTHALMIC | 11 refills | Status: DC

## 2020-01-18 MED FILL — OLOPATADINE HCL 0.1 % SOLN: 0.1 | 50 days supply | Qty: 5 | Fill #0

## 2020-01-18 NOTE — Addendum Note (Signed)
Addended by: Mady Haagensen on: 01/18/2020 04:02 PM   Modules accepted: Orders

## 2020-01-27 ENCOUNTER — Other Ambulatory Visit: Payer: Self-pay

## 2020-01-27 MED ORDER — OLOPATADINE HCL 0.1 % OP SOLN: OPHTHALMIC | 11 refills | Status: DC

## 2020-01-31 ENCOUNTER — Other Ambulatory Visit: Payer: Self-pay | Admitting: Internal Medicine

## 2020-01-31 MED FILL — VALACYCLOVIR HCL 500 MG TAB: 500 | 5 days supply | Qty: 10 | Fill #0

## 2020-01-31 MED FILL — ZOLPIDEM TARTRATE 10 MG TAB: 10 | 90 days supply | Qty: 90 | Fill #0

## 2020-02-07 NOTE — Telephone Encounter (Signed)
Called and spoke with patient and let her know that [facility] had denied this medication and she said don't worry about it, the medication is now over the counter so she had picked some up and will get it that way from now on.

## 2020-04-28 ENCOUNTER — Other Ambulatory Visit: Payer: Self-pay | Admitting: Internal Medicine

## 2020-05-02 ENCOUNTER — Other Ambulatory Visit: Payer: Self-pay

## 2020-05-02 MED ORDER — ZOLPIDEM TARTRATE 10 MG PO TABS: 10.0000 mg | ORAL_TABLET | Freq: Every day | ORAL | 0 refills | Status: DC

## 2020-05-02 MED FILL — ZOLPIDEM TARTRATE 10 MG TAB: 10 | 90 days supply | Qty: 90 | Fill #0

## 2020-06-02 ENCOUNTER — Other Ambulatory Visit: Payer: Self-pay | Admitting: Internal Medicine

## 2020-06-19 ENCOUNTER — Encounter: Payer: Self-pay | Admitting: Internal Medicine

## 2020-06-19 ENCOUNTER — Other Ambulatory Visit: Payer: Self-pay

## 2020-06-19 ENCOUNTER — Ambulatory Visit: Payer: Medicare PPO | Admitting: Internal Medicine

## 2020-06-19 VITALS — BP 140/80 | HR 66 | Ht 63.0 in | Wt 155.0 lb

## 2020-06-19 DIAGNOSIS — F329 Major depressive disorder, single episode, unspecified: Secondary | ICD-10-CM

## 2020-06-19 DIAGNOSIS — F419 Anxiety disorder, unspecified: Secondary | ICD-10-CM

## 2020-06-19 DIAGNOSIS — G4726 Circadian rhythm sleep disorder, shift work type: Secondary | ICD-10-CM | POA: Diagnosis not present

## 2020-06-19 DIAGNOSIS — F32A Depression, unspecified: Secondary | ICD-10-CM

## 2020-06-19 MED ORDER — ALBUTEROL SULFATE HFA 108 (90 BASE) MCG/ACT IN AERS: 2.0000 | INHALATION_SPRAY | Freq: Four times a day (QID) | RESPIRATORY_TRACT | 99 refills | Status: DC | PRN

## 2020-06-19 MED ORDER — BUDESONIDE-FORMOTEROL FUMARATE 160-4.5 MCG/ACT IN AERO: 2.0000 | INHALATION_SPRAY | Freq: Two times a day (BID) | RESPIRATORY_TRACT | 3 refills | Status: DC

## 2020-06-19 NOTE — Progress Notes (Signed)
   Subjective:    Patient ID: Robin Chaney, female    DOB: Dec 02, 1950, 69 y.o.   MRN: [id number]  HPI 69 year old Female here today for 56-month follow-up on chronic Ambien therapy.  She has thought about retiring.  Currently working part-time.  This is been hard during the pandemic.  She would like to have more free time to do something she likes to do.  She has taken Ambien for years due to shift workers insomnia and has no trouble with that at all with any side effects whatsoever.  This will be refilled for 6 months.  She takes Lexapro for anxiety and depression.  She had welcome to Medicare exam in March 2021.  Review of Systems See above    Objective:   Physical Exam  Blood pressure 140/80 pulse 66 pulse oximetry 96% weight 155 pounds BMI 27.46  She sleeping well with Ambien and does not abuse it.  Continue current medication as prescribed.  She has no side effects and has never had any sleepwalking with this medication.  Anxiety depression stable on Lexapro.      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  Health maintenance exam due March 2022.  Dr. Julien Girt is GYN physician.

## 2020-06-26 ENCOUNTER — Other Ambulatory Visit: Payer: Self-pay

## 2020-06-26 ENCOUNTER — Telehealth: Payer: Self-pay | Admitting: Internal Medicine

## 2020-06-26 MED ORDER — BUDESONIDE-FORMOTEROL FUMARATE 160-4.5 MCG/ACT IN AERO: 2.0000 | INHALATION_SPRAY | Freq: Two times a day (BID) | RESPIRATORY_TRACT | 11 refills | Status: DC

## 2020-06-26 NOTE — Telephone Encounter (Signed)
Robin Chaney [phone]  Zahari called to say we are going to get a request from [facility] mail in pharmacy for the below medication, because [facility] is just too expensive.   budesonide-formoterol (SYMBICORT) 160-4.5 MCG/ACT inhaler

## 2020-07-02 NOTE — Patient Instructions (Signed)
Ambien refilled for 6 months.  She uses it responsibly and does not have any side effects with it.  Her health maintenance exam is due March 2022.  Continue Lexapro.

## 2020-07-31 ENCOUNTER — Ambulatory Visit (INDEPENDENT_AMBULATORY_CARE_PROVIDER_SITE_OTHER): Payer: Medicare PPO | Admitting: Internal Medicine

## 2020-07-31 ENCOUNTER — Encounter: Payer: Self-pay | Admitting: Internal Medicine

## 2020-07-31 ENCOUNTER — Telehealth: Payer: Self-pay | Admitting: Internal Medicine

## 2020-07-31 ENCOUNTER — Other Ambulatory Visit: Payer: Self-pay

## 2020-07-31 ENCOUNTER — Other Ambulatory Visit: Payer: Self-pay | Admitting: Internal Medicine

## 2020-07-31 DIAGNOSIS — F4321 Adjustment disorder with depressed mood: Secondary | ICD-10-CM | POA: Diagnosis not present

## 2020-07-31 DIAGNOSIS — F411 Generalized anxiety disorder: Secondary | ICD-10-CM | POA: Diagnosis not present

## 2020-07-31 MED ORDER — ALPRAZOLAM 1 MG PO TABS: 1.0000 mg | ORAL_TABLET | Freq: Three times a day (TID) | ORAL | 0 refills | Status: DC | PRN

## 2020-07-31 MED FILL — ALPRAZolam 1 MG TABS: 1 | 10 days supply | Qty: 30 | Fill #0

## 2020-07-31 MED FILL — ZOLPIDEM TARTRATE 10 MG TAB: 10 | 90 days supply | Qty: 90 | Fill #0

## 2020-07-31 NOTE — Telephone Encounter (Signed)
Called and gave patient the phone numbers and she has also reached out to our number at work that helps employees in crisis.

## 2020-07-31 NOTE — Telephone Encounter (Signed)
I tried to call her and got voice mail. I was going to speak with her by phone rather than office visit.

## 2020-07-31 NOTE — Telephone Encounter (Signed)
Tried to reach patient by phone but got voice mail. Am happy to call in whatever anti-anxiety med she would like to have ie Xanax, Valium, Klonopin

## 2020-07-31 NOTE — Telephone Encounter (Signed)
Robin Chaney called back and she was able to talk with counselor thru our at work, but she can not stop crying and she needs something for her nerves. What about OV this afternoon?

## 2020-07-31 NOTE — Patient Instructions (Signed)
I am sorry to hear about your loss.  Please take Xanax 1 mg tablets 1/2 to 1 tablet up to 3 times daily as needed for anxiety and grief.  Continue Ambien at night for sleep.  Continue with counseling through [facility].  Agree with taking a few days off work.

## 2020-07-31 NOTE — Telephone Encounter (Signed)
Robin Chaney [phone]  [name] called to say her dog had been brutley attached by 2 dogs yesterday and killed, she was also bitten on her hand in the process. The dogs were vaccinated, and she is on an antibiotic. She does feel like she needs to talk with someone about this, it has been really hard losing her pet, she stated this is like losing a family member. She would like to come in or be referred somewhere as soon as possible.

## 2020-07-31 NOTE — Telephone Encounter (Signed)
These appointments are hard to get. I would suggest  [facility] and see if they can see her soon.  She will need to call. They will not let us make an appt.The other option is Josph Macho May who works some with [employer]. Once again, she needs to call them.

## 2020-07-31 NOTE — Telephone Encounter (Signed)
Called and set of phone appointment

## 2020-07-31 NOTE — Progress Notes (Signed)
   Subjective:    Patient ID: Robin Chaney, female    DOB: 1950-12-07, 69 y.o.   MRN: [id number]  HPI 69 year old Female called today saying her small Pomeranian dog was brutally atatcked by 2 larger dogs while she was walking her to the park. The dog succumbed to injuries as a result of this attack. Patient is distraught. She was not able to go to work today. Called earlier in the day asking about counselors. We provided some names for her to consider but she was able to speak with someone through [facility].She thinks she will need to be out of work for a few days. Has Ambien for sleep but did not sleep well last evening. Would like something for anxiety.  We connected by phone today. I am in my office and patient is at her home.  She is identified using 2 identifiers as Robin Chaney, a patient in this practice.  She is agreeable to visit in this format today.  On the phone, she is clearly distraught and upset.  Her dog was 15 years old and she was very close to her dog.  Tells me she was able to connect with someone through [facility] for a brief time today for counseling.  She does not think she will be able to go to work later this week.  Has been working part-time.    Review of Systems crying on the phone today and is clearly distraught     Objective:   Physical Exam Connected with patient by phone for approximately 10 minutes, obtaining history, consoling patient and offering advice with regard to treating anxiety symptoms.       Assessment & Plan:  Grief reaction  Anxiety state  History of insomnia longstanding treated with Ambien  Plan: I have prescribed Xanax 1 mg tablets( #30).  Patient may take 1/2 to 1 tablet up to 3 times daily as needed for anxiety.  Agree with counseling through [employer].  Agree with patient taking several days off of work.  Total time spent with patient on phone, history taking, medical decision making  and calling in prescription urgently to [facility] [facility] is 15 minutes.

## 2020-08-01 ENCOUNTER — Other Ambulatory Visit: Payer: Self-pay | Admitting: Internal Medicine

## 2020-08-07 ENCOUNTER — Other Ambulatory Visit: Payer: Self-pay | Admitting: Internal Medicine

## 2020-08-07 MED FILL — FLUCONAZOLE 150 MG TABS: 150 | 1 days supply | Qty: 1 | Fill #0

## 2020-08-10 MED FILL — FLUCONAZOLE 150 MG TABS: 150 | 1 days supply | Qty: 1 | Fill #1

## 2020-08-11 ENCOUNTER — Other Ambulatory Visit: Payer: Self-pay | Admitting: Physician Assistant

## 2020-08-11 MED ORDER — CEPHALEXIN 500 MG PO CAPS: 500.0000 mg | ORAL_CAPSULE | Freq: Four times a day (QID) | ORAL | 0 refills | Status: DC

## 2020-08-11 MED ORDER — FLUCONAZOLE 150 MG PO TABS
ORAL_TABLET | ORAL | 1 refills | Status: DC
Start: 2020-08-11 — End: 2020-08-11

## 2020-08-11 MED FILL — CEPHALEXIN 500 MG CAPSULE: 500 | 10 days supply | Qty: 40 | Fill #0

## 2020-08-11 MED FILL — FLUCONAZOLE 150 MG TABS: 150 | 1 days supply | Qty: 1 | Fill #0

## 2020-10-02 ENCOUNTER — Other Ambulatory Visit: Payer: Self-pay | Admitting: Internal Medicine

## 2020-10-16 ENCOUNTER — Other Ambulatory Visit: Payer: Self-pay | Admitting: Internal Medicine

## 2020-10-19 ENCOUNTER — Other Ambulatory Visit (HOSPITAL_COMMUNITY): Payer: Self-pay | Admitting: Obstetrics and Gynecology

## 2020-10-19 DIAGNOSIS — Z779 Other contact with and (suspected) exposures hazardous to health: Secondary | ICD-10-CM | POA: Diagnosis not present

## 2020-10-19 DIAGNOSIS — Z6827 Body mass index (BMI) 27.0-27.9, adult: Secondary | ICD-10-CM | POA: Diagnosis not present

## 2020-10-19 DIAGNOSIS — Z1231 Encounter for screening mammogram for malignant neoplasm of breast: Secondary | ICD-10-CM | POA: Diagnosis not present

## 2020-10-24 ENCOUNTER — Other Ambulatory Visit: Payer: Self-pay | Admitting: Obstetrics and Gynecology

## 2020-10-24 DIAGNOSIS — R928 Other abnormal and inconclusive findings on diagnostic imaging of breast: Secondary | ICD-10-CM

## 2020-10-25 ENCOUNTER — Other Ambulatory Visit: Payer: Self-pay | Admitting: Internal Medicine

## 2020-10-25 NOTE — Telephone Encounter (Signed)
Pharmacy called and said patient would like to get below medication filled tomorrow because of calling for bad weather on Friday.  zolpidem (AMBIEN) 10 MG tablet  [place], [place] - [address] [facility]. Phone:  [phone]  Fax:  [phone]

## 2020-10-25 NOTE — Telephone Encounter (Signed)
Please pend

## 2020-10-26 ENCOUNTER — Other Ambulatory Visit: Payer: Self-pay | Admitting: Internal Medicine

## 2020-10-26 MED ORDER — ZOLPIDEM TARTRATE 10 MG PO TABS
10.0000 mg | ORAL_TABLET | Freq: Every day | ORAL | 1 refills | Status: DC
Start: 2020-10-26 — End: 2020-10-26

## 2020-10-26 NOTE — Addendum Note (Signed)
Addended by: Mady Haagensen on: 10/26/2020 09:15 AM   Modules accepted: Orders

## 2020-10-27 MED FILL — ZOLPIDEM TARTRATE 10 MG TAB: 10 | 90 days supply | Qty: 90 | Fill #0

## 2020-11-03 ENCOUNTER — Other Ambulatory Visit: Payer: Self-pay | Admitting: Obstetrics and Gynecology

## 2020-11-03 ENCOUNTER — Ambulatory Visit
Admission: RE | Admit: 2020-11-03 | Discharge: 2020-11-03 | Disposition: A | Discharge status: Home / Self Care | Payer: Medicare PPO | Source: Ambulatory Visit | Attending: Obstetrics and Gynecology | Admitting: Obstetrics and Gynecology

## 2020-11-03 ENCOUNTER — Other Ambulatory Visit: Payer: Self-pay

## 2020-11-03 DIAGNOSIS — R928 Other abnormal and inconclusive findings on diagnostic imaging of breast: Secondary | ICD-10-CM

## 2020-11-03 DIAGNOSIS — N6489 Other specified disorders of breast: Secondary | ICD-10-CM | POA: Diagnosis not present

## 2020-11-03 DIAGNOSIS — N6001 Solitary cyst of right breast: Secondary | ICD-10-CM | POA: Diagnosis not present

## 2020-11-03 DIAGNOSIS — R922 Inconclusive mammogram: Secondary | ICD-10-CM | POA: Diagnosis not present

## 2020-11-03 DIAGNOSIS — N6311 Unspecified lump in the right breast, upper outer quadrant: Secondary | ICD-10-CM | POA: Diagnosis not present

## 2020-11-03 DIAGNOSIS — Z803 Family history of malignant neoplasm of breast: Secondary | ICD-10-CM | POA: Diagnosis not present

## 2020-11-03 DIAGNOSIS — T8549XA Other mechanical complication of breast prosthesis and implant, initial encounter: Secondary | ICD-10-CM | POA: Diagnosis not present

## 2020-11-03 DIAGNOSIS — N6321 Unspecified lump in the left breast, upper outer quadrant: Secondary | ICD-10-CM | POA: Diagnosis not present

## 2020-11-21 DIAGNOSIS — Z20822 Contact with and (suspected) exposure to covid-19: Secondary | ICD-10-CM | POA: Diagnosis not present

## 2020-11-21 DIAGNOSIS — Z03818 Encounter for observation for suspected exposure to other biological agents ruled out: Secondary | ICD-10-CM | POA: Diagnosis not present

## 2021-01-22 ENCOUNTER — Other Ambulatory Visit (HOSPITAL_COMMUNITY): Payer: Self-pay

## 2021-01-22 MED FILL — Zolpidem Tartrate Tab 10 MG: ORAL | 90 days supply | Qty: 90 | Fill #0 | Status: AC

## 2021-01-23 ENCOUNTER — Other Ambulatory Visit (HOSPITAL_COMMUNITY): Payer: Self-pay

## 2021-03-06 ENCOUNTER — Telehealth: Payer: Self-pay | Admitting: Internal Medicine

## 2021-03-06 NOTE — Telephone Encounter (Signed)
Scheduled

## 2021-03-06 NOTE — Telephone Encounter (Signed)
Robin Chaney [phone]  Robin Chaney called to say she has poison Ivy on both her arms the right arm is worse than left, be going one for a week. She has been treating with hydrocortisone cream but not much better. Would like prednisone called in. Let her know she would need office visit.

## 2021-03-06 NOTE — Telephone Encounter (Signed)
LVM to CB.

## 2021-03-06 NOTE — Telephone Encounter (Signed)
Please let her know you spoke with me personally and I will not call in anything without an office visit.

## 2021-03-08 ENCOUNTER — Encounter: Payer: Self-pay | Admitting: Internal Medicine

## 2021-03-08 ENCOUNTER — Ambulatory Visit: Payer: Medicare PPO | Admitting: Internal Medicine

## 2021-03-08 ENCOUNTER — Other Ambulatory Visit: Payer: Self-pay

## 2021-03-08 ENCOUNTER — Other Ambulatory Visit (HOSPITAL_COMMUNITY): Payer: Self-pay

## 2021-03-08 VITALS — BP 120/80 | HR 78 | Temp 98.3°F | Ht 63.0 in | Wt 156.0 lb

## 2021-03-08 DIAGNOSIS — L237 Allergic contact dermatitis due to plants, except food: Secondary | ICD-10-CM | POA: Diagnosis not present

## 2021-03-08 MED ORDER — PREDNISONE 10 MG PO TABS
ORAL_TABLET | ORAL | 1 refills | Status: DC
  Filled 2021-03-08: qty 21, 6d supply, fill #0

## 2021-03-08 NOTE — Progress Notes (Signed)
   Subjective:    Patient ID: Robin Chaney, female    DOB: [birth year], 70 y.o.   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 She tried  topical hydrocortisone cream but rash has not improved.    Patient thinks new dog may have brought this in on  its fur.    Review of Systems no fever or chills. No significant travel history.      Objective:   Physical Exam  Pruritic red papular rash on arms consistent with poison ivy some  lesions in linear distribution that would favor dx of poison ivy. No facial involvement       Assessment & Plan:  Poison Ivy dermatitis  Plan: Prednisone 10 mg tablets (#21) take 6 tablets day 1 and decrease by 10 mg daily i.e. 6-5-4-3-2-1 taper.  Patient has 1 refill on this prescription.  Call if not better in a week.

## 2021-04-01 ENCOUNTER — Encounter: Payer: Self-pay | Admitting: Internal Medicine

## 2021-04-01 NOTE — Patient Instructions (Addendum)
Take prednisone 10 mg tablets in tapering course as directed starting with 6 tablets day 1 and decreasing by 1 tablet daily i.e. 6-5-4-3-2-1 taper.  Patient has 1 refill.  It was a pleasure to see you today.  Call if not better in a week.

## 2021-04-19 ENCOUNTER — Other Ambulatory Visit (HOSPITAL_COMMUNITY): Payer: Self-pay

## 2021-04-19 ENCOUNTER — Ambulatory Visit (INDEPENDENT_AMBULATORY_CARE_PROVIDER_SITE_OTHER): Payer: Medicare PPO | Admitting: Internal Medicine

## 2021-04-19 ENCOUNTER — Telehealth: Payer: Self-pay | Admitting: Internal Medicine

## 2021-04-19 ENCOUNTER — Other Ambulatory Visit: Payer: Self-pay

## 2021-04-19 ENCOUNTER — Encounter: Payer: Self-pay | Admitting: Internal Medicine

## 2021-04-19 VITALS — Temp 99.0°F

## 2021-04-19 DIAGNOSIS — J069 Acute upper respiratory infection, unspecified: Secondary | ICD-10-CM | POA: Diagnosis not present

## 2021-04-19 MED ORDER — FLUCONAZOLE 150 MG PO TABS
150.0000 mg | ORAL_TABLET | Freq: Once | ORAL | 0 refills | Status: AC
  Filled 2021-04-19: qty 1, 1d supply, fill #0

## 2021-04-19 MED ORDER — AZITHROMYCIN 250 MG PO TABS
ORAL_TABLET | ORAL | 0 refills | Status: AC
  Filled 2021-04-19: qty 6, 5d supply, fill #0

## 2021-04-19 NOTE — Telephone Encounter (Signed)
scheduled

## 2021-04-19 NOTE — Patient Instructions (Addendum)
Take Zithromax Z-PAK 2 tabs day 1 followed by 1 tab days 2 through 5.  May take Diflucan if needed for Candida vaginitis while on antibiotics.  Rest and drink fluids.

## 2021-04-19 NOTE — Telephone Encounter (Addendum)
Fumiye Lubben [phone]  Tyneisha called to say she just got back from beach on Monday and on Tuesday she started having Sore throat, cough, trouble catching breath at times. Feels like bad head cold, She done Home COVID test on Tuesday and Wednesday and they were both negative. She has had 2 vaccine and a booster. Iantha also called back to say she would like to get that cough medicine now, that she turned down last week.

## 2021-04-19 NOTE — Progress Notes (Signed)
   Subjective:    Patient ID: Robin Chaney, female    DOB: Apr 01, 1951, 70 y.o.   MRN: [id number]  HPI 70 year old Female just returned from a beach trip with girlfriends on Monday, July 11 and has a respiratory infection.  Did 2 home COVID-19 test that were negative.  Offered PCR testing but she does not feel that is necessary.  Says she was at the beach and air conditioning was running in hotel room.  Says no one else was sick.  Complains of sore throat, cough, head congestion.  Has been vaccinated for COVID-19.  No known drug allergies.  Dr. Julien Girt is GYN physician.  General health is excellent.    Review of Systems has malaise and fatigue.  Cough is aggravating.     Objective:   Physical Exam Skin: Warm and dry.  Pharynx slightly injected without exudate.  TMs clear.  Chest clear to auscultation without rales or wheezing.       Assessment & Plan:  Acute respiratory infection  To home COVID test are negative.  Patient declined PCR testing.  Plan: Zithromax Z-PAK 2 tabs day 1 followed by 1 tab days 2 through 5.  Diflucan 150 mg tablet.prescribed to take to prevent Candida vaginitis.  1 refill given.  Rest and drink fluids.  Offered narcotic cough medication but she declined.

## 2021-04-24 ENCOUNTER — Other Ambulatory Visit (HOSPITAL_COMMUNITY): Payer: Self-pay

## 2021-04-24 ENCOUNTER — Other Ambulatory Visit: Payer: Self-pay

## 2021-04-24 ENCOUNTER — Encounter: Payer: Self-pay | Admitting: Internal Medicine

## 2021-04-24 ENCOUNTER — Telehealth (INDEPENDENT_AMBULATORY_CARE_PROVIDER_SITE_OTHER): Payer: Medicare PPO | Admitting: Internal Medicine

## 2021-04-24 DIAGNOSIS — U071 COVID-19: Secondary | ICD-10-CM

## 2021-04-24 MED ORDER — HYDROCODONE BIT-HOMATROP MBR 5-1.5 MG/5ML PO SOLN
5.0000 mL | Freq: Three times a day (TID) | ORAL | 0 refills | Status: DC | PRN
  Filled 2021-04-24: qty 120, 8d supply, fill #0

## 2021-04-24 MED ORDER — NIRMATRELVIR & RITONAVIR 20 X 150 MG & 10 X 100MG PO TBPK
ORAL_TABLET | ORAL | 0 refills | Status: DC
  Filled 2021-04-24: qty 30, 5d supply, fill #0

## 2021-04-24 NOTE — Telephone Encounter (Signed)
Robin Chaney has called back to say she has now tested positive for COVID with a home test today. She is having fatigue and stuffiness. She is supposed to go out of town tomorrow to visit friend, she is wandering what to do since she has been sick for a week today, but tested negative last week. Virtual visit this afternoon?

## 2021-04-24 NOTE — Telephone Encounter (Signed)
Patient called back and confirmed appointment, she has no equipment to take vitals

## 2021-04-24 NOTE — Progress Notes (Signed)
   Subjective:    Patient ID: Robin Chaney, female    DOB: Nov 17, 1950, 70 y.o.   MRN: [id number]  HPI 70 year old Female seen today via interactive audio and video telecommunications due to the Coronavirus pandemic.  She is at her home and I am in my office.  She is identified using 2 identifiers as Robin Chaney, a patient in this practice.  She is agreeable to visit in this format today.  Patient was seen on July 14 in person having been on a beach trip with girlfriends from July 11 through July 14.  Had come down with a respiratory infection.  She did 2 home COVID-19 tests that were negative.  No one else became ill.  Complains of sore throat, cough, head congestion.  Has had 3 COVID-19 immunizations.  Patient was offered PCR testing on July 14 but she did not feel that was necessary.  She was treated with Zithromax Z-PAK and Diflucan to prevent Candida vaginitis while on antibiotics.  Was advised to rest and drink fluids.  Offered narcotic cough medication but she declined.  She called today saying she had tested positive for COVID-19.  Has some malaise and fatigue.  Has respiratory congestion.  Does not have a thermometer and therefore cannot take her temperature today.  Has sore throat.  Continues with cough and head congestion.  Had planned to go to the [employer] tomorrow but will not travel now that she has COVID-19.  No known drug allergies.  History of pneumonia and asthma as an adult and also has a young child.  In July 2019 she underwent fiberoptic bronchoscopy by Dr. Melvyn Novas and a green pea was found lodged in her right lower lobe which apparently was aspirated while eating some fried rice a few days previously.  Social history: She is a retired Marine scientist.  She is divorced.  She resides alone.  No children.  Does not smoke.  Social alcohol consumption.  Review of Systems no nausea, vomiting     Objective:   Physical Exam She looks a bit pale and sounds slightly nasally  congested.  Does not appear tachypneic.  No audible wheezing.  Looks fatigued.       Assessment & Plan:  Acute COVID-19 virus infection via home test  Plan: Have prescribed Paxlovid for her and Hycodan 1 teaspoon every 8 hours as needed for cough and sore throat pain.  Advised to rest and drink plenty of fluids.  Needs to quarantine for 5 days.

## 2021-04-24 NOTE — Telephone Encounter (Signed)
LVM to CB to confirm Video visit at 4:00

## 2021-04-24 NOTE — Patient Instructions (Signed)
Paxlovid (Nirmatrelvir 20 x 150 mg 2 tabs twice daily and Ritonavir 10 x 100 mg one tab twice daily). Her GFR is normal.  Hycodan 1 teaspoon every 8 hours as needed for cough and sore throat pain.  Rest and drink fluids.  Quarantine for 5 days.  Call if symptoms worsen.

## 2021-04-29 ENCOUNTER — Other Ambulatory Visit: Payer: Self-pay | Admitting: Internal Medicine

## 2021-04-30 ENCOUNTER — Other Ambulatory Visit (HOSPITAL_COMMUNITY): Payer: Self-pay

## 2021-04-30 ENCOUNTER — Telehealth: Payer: Self-pay | Admitting: Internal Medicine

## 2021-04-30 MED ORDER — ZOLPIDEM TARTRATE 10 MG PO TABS
10.0000 mg | ORAL_TABLET | Freq: Every day | ORAL | 1 refills | Status: DC
  Filled 2021-04-30: qty 90, 90d supply, fill #0
  Filled 2021-07-23 – 2021-07-27 (×2): qty 90, 90d supply, fill #1

## 2021-04-30 NOTE — Telephone Encounter (Signed)
Faxed Positive Home COVID results to [facility] [phone], phone [phone] + 04/24/2021

## 2021-05-04 ENCOUNTER — Ambulatory Visit
Admission: RE | Admit: 2021-05-04 | Discharge: 2021-05-04 | Disposition: A | Discharge status: Home / Self Care | Payer: Medicare PPO | Source: Ambulatory Visit | Attending: Obstetrics and Gynecology | Admitting: Obstetrics and Gynecology

## 2021-05-04 ENCOUNTER — Other Ambulatory Visit: Payer: Self-pay | Admitting: Obstetrics and Gynecology

## 2021-05-04 ENCOUNTER — Other Ambulatory Visit: Payer: Self-pay

## 2021-05-04 DIAGNOSIS — R928 Other abnormal and inconclusive findings on diagnostic imaging of breast: Secondary | ICD-10-CM

## 2021-05-04 DIAGNOSIS — N6311 Unspecified lump in the right breast, upper outer quadrant: Secondary | ICD-10-CM | POA: Diagnosis not present

## 2021-05-04 DIAGNOSIS — N6489 Other specified disorders of breast: Secondary | ICD-10-CM

## 2021-05-28 ENCOUNTER — Other Ambulatory Visit (HOSPITAL_COMMUNITY): Payer: Self-pay

## 2021-05-28 ENCOUNTER — Other Ambulatory Visit: Payer: Self-pay | Admitting: Internal Medicine

## 2021-05-28 MED ORDER — TRETINOIN 0.1 % EX CREA
TOPICAL_CREAM | CUTANEOUS | 1 refills | Status: DC
  Filled 2021-05-28 – 2021-05-29 (×2): qty 45, 30d supply, fill #0

## 2021-05-29 ENCOUNTER — Other Ambulatory Visit (HOSPITAL_COMMUNITY): Payer: Self-pay

## 2021-05-29 ENCOUNTER — Telehealth: Payer: Self-pay | Admitting: Internal Medicine

## 2021-05-29 MED ORDER — TRETINOIN 0.1 % EX CREA: TOPICAL_CREAM | CUTANEOUS | 1 refills | Status: DC

## 2021-05-29 NOTE — Telephone Encounter (Signed)
Order has been cancelled.  

## 2021-05-29 NOTE — Addendum Note (Signed)
Addended by: Mady Haagensen on: 05/29/2021 01:09 PM   Modules accepted: Orders

## 2021-05-29 NOTE — Telephone Encounter (Signed)
Merryl Hacker [phone]  Giordana called to say cancel RX from [facility], because it was over $200, so she went to [facility] and used the [employer] and price was much better, so they should be sending something over for below medication.  tretinoin (RETIN-A) 0.1 % cream

## 2021-05-31 ENCOUNTER — Other Ambulatory Visit (HOSPITAL_COMMUNITY): Payer: Self-pay

## 2021-06-14 ENCOUNTER — Other Ambulatory Visit: Payer: Medicare PPO | Admitting: Internal Medicine

## 2021-06-14 ENCOUNTER — Other Ambulatory Visit: Payer: Self-pay

## 2021-06-14 DIAGNOSIS — Z13220 Encounter for screening for lipoid disorders: Secondary | ICD-10-CM

## 2021-06-14 DIAGNOSIS — Z79899 Other long term (current) drug therapy: Secondary | ICD-10-CM | POA: Diagnosis not present

## 2021-06-14 DIAGNOSIS — Z1329 Encounter for screening for other suspected endocrine disorder: Secondary | ICD-10-CM | POA: Diagnosis not present

## 2021-06-14 DIAGNOSIS — Z0000 Encounter for general adult medical examination without abnormal findings: Secondary | ICD-10-CM | POA: Diagnosis not present

## 2021-06-14 DIAGNOSIS — Z130 Encounter for screening for diseases of the blood and blood-forming organs and certain disorders involving the immune mechanism: Secondary | ICD-10-CM | POA: Diagnosis not present

## 2021-06-14 LAB — CBC WITH DIFFERENTIAL/PLATELET
Absolute Monocytes: 477 cells/uL (ref 200–950)
Basophils Absolute: 59 cells/uL (ref 0–200)
Basophils Relative: 1.9 %
Eosinophils Absolute: 118 cells/uL (ref 15–500)
Eosinophils Relative: 3.8 %
HCT: 39.8 % (ref 35.0–45.0)
Hemoglobin: 13.6 g/dL (ref 11.7–15.5)
Lymphs Abs: 1869 cells/uL (ref 850–3900)
MCH: 30.8 pg (ref 27.0–33.0)
MCHC: 34.2 g/dL (ref 32.0–36.0)
MCV: 90 fL (ref 80.0–100.0)
MPV: 10.2 fL (ref 7.5–12.5)
Monocytes Relative: 15.4 %
Neutro Abs: 577 cells/uL — ABNORMAL LOW (ref 1500–7800)
Neutrophils Relative %: 18.6 %
Platelets: 289 10*3/uL (ref 140–400)
RBC: 4.42 10*6/uL (ref 3.80–5.10)
RDW: 12.6 % (ref 11.0–15.0)
Total Lymphocyte: 60.3 %
WBC: 3.1 10*3/uL — ABNORMAL LOW (ref 3.8–10.8)

## 2021-06-14 LAB — COMPLETE METABOLIC PANEL WITH GFR
AG Ratio: 1.7 (calc) (ref 1.0–2.5)
ALT: 24 U/L (ref 6–29)
AST: 29 U/L (ref 10–35)
Albumin: 3.8 g/dL (ref 3.6–5.1)
Alkaline phosphatase (APISO): 53 U/L (ref 37–153)
BUN/Creatinine Ratio: 37 (calc) — ABNORMAL HIGH (ref 6–22)
BUN: 16 mg/dL (ref 7–25)
CO2: 22 mmol/L (ref 20–32)
Calcium: 8.3 mg/dL — ABNORMAL LOW (ref 8.6–10.4)
Chloride: 106 mmol/L (ref 98–110)
Creat: 0.43 mg/dL — ABNORMAL LOW (ref 0.60–1.00)
Globulin: 2.3 g/dL (calc) (ref 1.9–3.7)
Glucose, Bld: 96 mg/dL (ref 65–99)
Potassium: 4.4 mmol/L (ref 3.5–5.3)
Sodium: 137 mmol/L (ref 135–146)
Total Bilirubin: 0.4 mg/dL (ref 0.2–1.2)
Total Protein: 6.1 g/dL (ref 6.1–8.1)
eGFR: 105 mL/min/{1.73_m2} (ref >=60)

## 2021-06-14 LAB — LIPID PANEL
Cholesterol: 199 mg/dL (ref <200)
HDL: 41 mg/dL — ABNORMAL LOW (ref >=50)
LDL Cholesterol (Calc): 111 mg/dL (calc) — ABNORMAL HIGH
Non-HDL Cholesterol (Calc): 158 mg/dL (calc) — ABNORMAL HIGH (ref <130)
Total CHOL/HDL Ratio: 4.9 (calc) (ref <5.0)
Triglycerides: 352 mg/dL — ABNORMAL HIGH (ref <150)

## 2021-06-14 LAB — TSH: TSH: 3.35 mIU/L (ref 0.40–4.50)

## 2021-06-14 NOTE — Addendum Note (Signed)
Addended by: Amado Coe on: 06/14/2021 11:44 AM   Modules accepted: Orders

## 2021-06-15 ENCOUNTER — Other Ambulatory Visit (HOSPITAL_COMMUNITY): Payer: Self-pay

## 2021-06-15 ENCOUNTER — Encounter: Payer: Self-pay | Admitting: Internal Medicine

## 2021-06-15 ENCOUNTER — Ambulatory Visit (INDEPENDENT_AMBULATORY_CARE_PROVIDER_SITE_OTHER): Payer: Medicare PPO | Admitting: Internal Medicine

## 2021-06-15 VITALS — BP 142/82 | HR 66 | Ht 63.0 in | Wt 153.0 lb

## 2021-06-15 DIAGNOSIS — R059 Cough, unspecified: Secondary | ICD-10-CM | POA: Diagnosis not present

## 2021-06-15 DIAGNOSIS — Z8709 Personal history of other diseases of the respiratory system: Secondary | ICD-10-CM

## 2021-06-15 DIAGNOSIS — F32A Depression, unspecified: Secondary | ICD-10-CM

## 2021-06-15 DIAGNOSIS — G4700 Insomnia, unspecified: Secondary | ICD-10-CM

## 2021-06-15 DIAGNOSIS — Z9882 Breast implant status: Secondary | ICD-10-CM

## 2021-06-15 DIAGNOSIS — Z0000 Encounter for general adult medical examination without abnormal findings: Secondary | ICD-10-CM | POA: Diagnosis not present

## 2021-06-15 DIAGNOSIS — K219 Gastro-esophageal reflux disease without esophagitis: Secondary | ICD-10-CM | POA: Diagnosis not present

## 2021-06-15 DIAGNOSIS — F419 Anxiety disorder, unspecified: Secondary | ICD-10-CM | POA: Diagnosis not present

## 2021-06-15 DIAGNOSIS — Z8616 Personal history of COVID-19: Secondary | ICD-10-CM | POA: Diagnosis not present

## 2021-06-15 DIAGNOSIS — E781 Pure hyperglyceridemia: Secondary | ICD-10-CM | POA: Diagnosis not present

## 2021-06-15 MED ORDER — PREDNISONE 10 MG PO TABS
10.0000 mg | ORAL_TABLET | ORAL | 0 refills | Status: DC
  Filled 2021-06-15 (×2): qty 21, 6d supply, fill #0

## 2021-06-15 NOTE — Patient Instructions (Addendum)
It was a pleasure to see you today.  Triglycerides are elevated.  Please watch fats in diet.  We went over foods that are high in fat.  Watch alcohol consumption.    Return in 3 months for fasting lipid panel.  We may need to consider medication if triglycerides have not improved at that time.  Have flu vaccine and COVID-vaccine this Fall

## 2021-06-15 NOTE — Progress Notes (Deleted)
Subjective:    Patient ID: Robin Chaney, female    DOB: 08-02-1951, 70 y.o.   MRN: [id number]  HPI    Review of Systems     Objective:   Physical Exam        Assessment & Plan:   Subjective:   Patient presents for Medicare Annual/Subsequent preventive examination.  Review Past Medical/Family/Social:   Risk Factors  Current exercise habits:  Dietary issues discussed:   Cardiac risk factors:  Depression Screen  (Note: if answer to either of the following is "Yes", a more complete depression screening is indicated)   Over the past two weeks, have you felt down, depressed or hopeless? No  Over the past two weeks, have you felt little interest or pleasure in doing things? No Have you lost interest or pleasure in daily life? No Do you often feel hopeless? No Do you cry easily over simple problems? No   Activities of Daily Living  In your present state of health, do you have any difficulty performing the following activities?:   Driving? No  Managing money? No  Feeding yourself? No  Getting from bed to chair? No  Climbing a flight of stairs? No  Preparing food and eating?: No  Bathing or showering? No  Getting dressed: No  Getting to the toilet? No  Using the toilet:No  Moving around from place to place: No  In the past year have you fallen or had a near fall?:No  Are you sexually active? No  Do you have more than one partner? No   Hearing Difficulties: No  Do you often ask people to speak up or repeat themselves? No  Do you experience ringing or noises in your ears? yes Do you have difficulty understanding soft or whispered voices? No  Do you feel that you have a problem with memory? No Do you often misplace items? No    Home Safety:  Do you have a smoke alarm at your residence? Yes Do you have grab bars in the bathroom? No Do you have throw rugs in your house? Yes   Cognitive Testing  Alert? Yes Normal Appearance?Yes  Oriented to person?  Yes Place? Yes  Time? Yes  Recall of three objects? Yes  Can perform simple calculations? Yes  Displays appropriate judgment?Yes  Can read the correct time from a watch face?Yes   List the Names of Other Physician/Practitioners you currently use:  See referral list for the physicians patient is currently seeing.     Review of Systems:   Objective:     General appearance: Appears stated age and mildly obese  Head: Normocephalic, without obvious abnormality, atraumatic  Eyes: conj clear, EOMi PEERLA  Ears: normal TM's and external ear canals both ears  Nose: Nares normal. Septum midline. Mucosa normal. No drainage or sinus tenderness.  Throat: lips, mucosa, and tongue normal; teeth and gums normal  Neck: no adenopathy, no carotid bruit, no JVD, supple, symmetrical, trachea midline and thyroid not enlarged, symmetric, no tenderness/mass/nodules  No CVA tenderness.  Lungs: clear to auscultation bilaterally  Breasts: normal appearance, no masses or tenderness, top of the pacemaker on left upper chest. Incision well-healed. It is tender.  Heart: regular rate and rhythm, S1, S2 normal, no murmur, click, rub or gallop  Abdomen: soft, non-tender; bowel sounds normal; no masses, no organomegaly  Musculoskeletal: ROM normal in all joints, no crepitus, no deformity, Normal muscle strengthen. Back  is symmetric, no curvature. Skin: Skin color, texture, turgor normal. No  rashes or lesions  Lymph nodes: Cervical, supraclavicular, and axillary nodes normal.  Neurologic: CN 2 -12 Normal, Normal symmetric reflexes. Normal coordination and gait  Psych: Alert & Oriented x 3, Mood appear stable.    Assessment:    Annual wellness medicare exam   Plan:    During the course of the visit the patient was educated and counseled about appropriate screening and preventive services including:        Patient Instructions (the written plan) was given to the patient.  Medicare Attestation  I have  personally reviewed:  The patient's medical and social history  Their use of alcohol, tobacco or illicit drugs  Their current medications and supplements  The patient's functional ability including ADLs,fall risks, home safety risks, cognitive, and hearing and visual impairment  Diet and physical activities  Evidence for depression or mood disorders  The patient's weight, height, BMI, and visual acuity have been recorded in the chart. I have made referrals, counseling, and provided education to the patient based on review of the above and I have provided the patient with a written personalized care plan for preventive services.

## 2021-06-15 NOTE — Progress Notes (Signed)
Subjective:    Patient ID: Robin Chaney, female    DOB: [birth year], 70 y.o.   MRN: [id number]  HPI 70 year old Female in today for Medicare wellness initial visit, health maintenance exam and evaluation of medical issues.  In July she had COVID-19 virus infection and still has some persistent cough.  She was given a tapering course of prednisone today.  She has a history of anxiety and depression and is taking Lexapro.  She is on hormone replacement per Dr. 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  She has a history of reflux and takes omeprazole.  History of allergic rhinitis treated with Allegra.  Has Ventolin inhaler on hand as well as Symbicort inhaler if needed.  History of bilateral breast implants.  Had colonoscopy in 2018.  She thinks it was a 10-year follow-up but I have asked her to check with [facility].  Later I was able to find in Epic a colonoscopy report from 2018.  5-year follow-up was recommended on colonoscopy.  That would be 2023.  Mammogram will be done through GYN office.  Her labs are reviewed and are within normal limits with exception of triglycerides of 352 and LDL cholesterol of 111.  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  She hopes to get back to exercising soon.  Went over foods that are high in triglycerides.  Should limit wine to no more than 2 glasses a day and preferably no more than 6 alcoholic drinks a week.  Review of Systems main complaint is hacking cough without sputum production.  This is something she developed post COVID in July.     Objective:   Physical Exam Blood pressure is elevated at 142/82 pulse 66 regular pulse oximetry 97% weight 153 pounds height 5 feet 3 inches BMI 27.10  She is anxious today and I think that is why her blood pressure is elevated.  She will need to monitor this at home.  Skin: Warm  and dry.  No cervical adenopathy or thyromegaly.  TMs clear.  Neck is supple.  Chest clear.  Cardiac exam: Regular rate and rhythm.  Abdomen is soft nondistended without hepatosplenomegaly masses or tenderness.  Breast and GYN exam deferred to Dr. Julien Girt.  No lower extremity pitting edema.  Neuro is intact without focal deficits       Assessment & Plan:  Persistent cough status post COVID-19 in July  Plan: She will take prednisone in tapering course as directed.  If symptoms do not improve, we can do chest x-ray and have further evaluation.  Use Symbicort inhaler.  Also has Ventolin inhaler.  Hypertriglyceridemia-needs to watch diet.  She will get more exercise.  We can follow-up with [place] in 3 months.  Anxiety and depression treated with Lexapro and  History of shift workers insomnia treated with Ambien-longstanding without side effects  Colonoscopy due 2023  Recommend COVID booster this Fall and flu vaccine  Follow-up lipid panel in 3 months.  May need to be on medication for hypertriglyceridemia.   Subjective:   Patient presents for Medicare Annual/Subsequent preventive examination.  Review Past Medical/Family/Social: See above Family history of MI in mother  Risk Factors  Current exercise habits: Used to go to the gym more often Dietary issues discussed: Yes  Cardiac risk factors: Hypertriglyceridemia and family history in mother  Depression  Screen  (Note: if answer to either of the following is "Yes", a more complete depression screening is indicated)   Over the past two weeks, have you felt down, depressed or hopeless? No  Over the past two weeks, have you felt little interest or pleasure in doing things? No Have you lost interest or pleasure in daily life? No Do you often feel hopeless? No Do you cry easily over simple problems? No   Activities of Daily Living  In your present state of health, do you have any difficulty performing the following activities?:    Driving? No  Managing money? No  Feeding yourself? No  Getting from bed to chair? No  Climbing a flight of stairs? No  Preparing food and eating?: No  Bathing or showering? No  Getting dressed: No  Getting to the toilet? No  Using the toilet:No  Moving around from place to place: No  In the past year have you fallen or had a near fall?:No  Are you sexually active? Not presently Do you have more than one partner? No   Hearing Difficulties: No  Do you often ask people to speak up or repeat themselves? No  Do you experience ringing or noises in your ears? No  Do you have difficulty understanding soft or whispered voices? No  Do you feel that you have a problem with memory? No Do you often misplace items? No    Home Safety:  Do you have a smoke alarm at your residence? Yes Do you have grab bars in the bathroom?  No Do you have throw rugs in your house?  No   Cognitive Testing  Alert? Yes Normal Appearance?Yes  Oriented to person? Yes Place? Yes  Time? Yes  Recall of three objects? Yes  Can perform simple calculations? Yes  Displays appropriate judgment?Yes  Can read the correct time from a watch face?Yes   List the Names of Other Physician/Practitioners you currently use:  See referral list for the physicians patient is currently seeing.  GYN Dr. Julien Girt   Review of Systems: See above   Objective:     General appearance: Appears younger than stated age Head: Normocephalic, without obvious abnormality, atraumatic  Eyes: conj clear, EOMi PEERLA  Ears: normal TM's and external ear canals both ears  Nose: Nares normal. Septum midline. Mucosa normal. No drainage or sinus tenderness.  Throat: lips, mucosa, and tongue normal; teeth and gums normal  Neck: no adenopathy, no carotid bruit, no JVD, supple, symmetrical, trachea midline and thyroid not enlarged, symmetric, no tenderness/mass/nodules  No CVA tenderness.  Lungs: clear to auscultation bilaterally  Breasts:  normal appearance, no masses or tenderness  Heart: regular rate and rhythm, S1, S2 normal, no murmur, click, rub or gallop  Abdomen: soft, non-tender; bowel sounds normal; no masses, no organomegaly  Musculoskeletal: ROM normal in all joints, no crepitus, no deformity, Normal muscle strengthen. Back  is symmetric, no curvature. Skin: Skin color, texture, turgor normal. No rashes or lesions  Lymph nodes: Cervical, supraclavicular, and axillary nodes normal.  Neurologic: CN 2 -12 Normal, Normal symmetric reflexes. Normal coordination and gait  Psych: Alert & Oriented x 3, Mood appear stable.    Assessment:    Annual wellness medicare exam   Plan:    During the course of the visit the patient was educated and counseled about appropriate screening and preventive services including:   Low-fat diet  Get COVID booster in the Fall.  Have flu vaccine.     Patient  Instructions (the written plan) was given to the patient.  Medicare Attestation  I have personally reviewed:  The patient's medical and social history  Their use of alcohol, tobacco or illicit drugs  Their current medications and supplements  The patient's functional ability including ADLs,fall risks, home safety risks, cognitive, and hearing and visual impairment  Diet and physical activities  Evidence for depression or mood disorders  The patient's weight, height, BMI, and visual acuity have been recorded in the chart. I have made referrals, counseling, and provided education to the patient based on review of the above and I have provided the patient with a written personalized care plan for preventive services.

## 2021-06-20 ENCOUNTER — Other Ambulatory Visit: Payer: Self-pay | Admitting: Internal Medicine

## 2021-07-18 ENCOUNTER — Other Ambulatory Visit: Payer: Self-pay

## 2021-07-18 ENCOUNTER — Ambulatory Visit (INDEPENDENT_AMBULATORY_CARE_PROVIDER_SITE_OTHER): Payer: Medicare PPO | Admitting: Internal Medicine

## 2021-07-18 DIAGNOSIS — Z23 Encounter for immunization: Secondary | ICD-10-CM

## 2021-07-18 NOTE — Progress Notes (Signed)
Flu vaccine given by CMA.  MJB, MD

## 2021-07-23 ENCOUNTER — Other Ambulatory Visit (HOSPITAL_COMMUNITY): Payer: Self-pay

## 2021-07-26 ENCOUNTER — Other Ambulatory Visit (HOSPITAL_COMMUNITY): Payer: Self-pay

## 2021-07-27 ENCOUNTER — Other Ambulatory Visit (HOSPITAL_COMMUNITY): Payer: Self-pay

## 2021-10-08 ENCOUNTER — Other Ambulatory Visit (HOSPITAL_COMMUNITY): Payer: Self-pay

## 2021-10-08 ENCOUNTER — Other Ambulatory Visit: Payer: Self-pay | Admitting: Internal Medicine

## 2021-10-09 ENCOUNTER — Other Ambulatory Visit (HOSPITAL_COMMUNITY): Payer: Self-pay

## 2021-10-09 MED ORDER — ALPRAZOLAM 1 MG PO TABS
1.0000 mg | ORAL_TABLET | Freq: Three times a day (TID) | ORAL | 0 refills | Status: DC | PRN
Start: 2021-10-09 — End: 2022-03-14
  Filled 2021-10-09: qty 30, 10d supply, fill #0

## 2021-10-09 NOTE — Telephone Encounter (Signed)
Patient states that she in fact does need a refill.

## 2021-10-22 ENCOUNTER — Telehealth: Payer: Self-pay | Admitting: Internal Medicine

## 2021-10-22 ENCOUNTER — Other Ambulatory Visit: Payer: Self-pay | Admitting: Internal Medicine

## 2021-10-22 NOTE — Telephone Encounter (Signed)
scheduled

## 2021-10-22 NOTE — Telephone Encounter (Signed)
Robin Chaney [phone]  Robin Chaney called to say for the last couple of months she has been some shortness of breath and dizziness. She also had a cold a month ago and she is still having some greenish mucus. She would like to be seen, she concerned about the SOB and maybe her blood pressure, she does not have blood pressure machine at home.

## 2021-10-22 NOTE — Telephone Encounter (Signed)
COVID test is negative

## 2021-10-23 ENCOUNTER — Other Ambulatory Visit (HOSPITAL_COMMUNITY): Payer: Self-pay

## 2021-10-23 MED ORDER — ZOLPIDEM TARTRATE 10 MG PO TABS
10.0000 mg | ORAL_TABLET | Freq: Every day | ORAL | 1 refills | Status: DC
Start: 2021-10-23 — End: 2022-04-15
  Filled 2021-10-23: qty 90, 90d supply, fill #0
  Filled 2022-01-16: qty 90, 90d supply, fill #1
  Filled 2022-01-16 – 2022-01-19 (×2): qty 90, 90d supply, fill #0

## 2021-10-26 ENCOUNTER — Encounter: Payer: Self-pay | Admitting: Internal Medicine

## 2021-10-26 ENCOUNTER — Other Ambulatory Visit (HOSPITAL_COMMUNITY): Payer: Self-pay

## 2021-10-26 ENCOUNTER — Ambulatory Visit (INDEPENDENT_AMBULATORY_CARE_PROVIDER_SITE_OTHER): Payer: Medicare PPO | Admitting: Internal Medicine

## 2021-10-26 ENCOUNTER — Other Ambulatory Visit: Payer: Self-pay

## 2021-10-26 VITALS — BP 118/76 | HR 76 | Temp 97.5°F

## 2021-10-26 DIAGNOSIS — E781 Pure hyperglyceridemia: Secondary | ICD-10-CM | POA: Diagnosis not present

## 2021-10-26 DIAGNOSIS — R0602 Shortness of breath: Secondary | ICD-10-CM | POA: Diagnosis not present

## 2021-10-26 DIAGNOSIS — Z8659 Personal history of other mental and behavioral disorders: Secondary | ICD-10-CM | POA: Diagnosis not present

## 2021-10-26 DIAGNOSIS — Z8709 Personal history of other diseases of the respiratory system: Secondary | ICD-10-CM

## 2021-10-26 DIAGNOSIS — R42 Dizziness and giddiness: Secondary | ICD-10-CM | POA: Diagnosis not present

## 2021-10-26 NOTE — Progress Notes (Signed)
Subjective:    Patient ID: Robin Chaney, female    DOB: April 30, 1951, 71 y.o.   MRN: [id number]  HPI 71 year old Female who looks much younger than stated age presents with shortness of breath and some vague dizziness for couple of months.  She had a respiratory infection about a month ago and still has some discolored drainage from her sinuses.  She does not have a home blood pressure device and she was concerned about her shortness of breath and was also concerned about her blood pressure.  She had a initial Medicare visit in September.  She has a history of anxiety and depression and takes Lexapro.  She is a retired Marine scientist.  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  She has a history of GE reflux and takes PPI.  History of allergic rhinitis treated with Allegra, Ventolin inhaler and Symbicort.  She is concerned about the expense of Symbicort.  She has a history of hypertriglyceridemia.  She exercises regularly.  Social alcohol consumption.  When she was seen for health maintenance exam in September she was having a hacking cough without sputum production.  Michela Pitcher this was something she developed post COVID infection in July 2022.  Her triglycerides were markedly elevated at 352 in September and she is also here for follow-up on that.  They have decreased to 233.  She has a low HDL of 40 and total cholesterol was 208.  LDL is 130.  She would benefit from statin medication based on these readings consistent with  mixed hyperlipidemia.  We were able to find a good price for her on albuterol inhaler.  A steroid containing inhaler such as Symbicort would likely be of some benefit as well but may be expensive on her insurance plan.  She can try GoodRx or ask her insurance plan what would be covered in terms of a steroid inhaler.      Review of Systems she has no chest pain or palpitations     Objective:   Physical Exam Her blood pressure is excellent at 118/76 pulse 76 and  regular pulse oximetry 97% on room air.  EKG interpretation: Her EKG appears to be essentially normal with exception of inverted T wave in lead V2 which very well may be normal for her and indeed was present on EKG in March 2021.   Her neck is supple without JVD thyromegaly or carotid bruits.  Chest is clear to auscultation.  No rales or wheezing identified.  Cardiac exam: Regular rate and rhythm without ectopy or murmur.  No gallop is appreciated.  She has no nystagmus on extraocular movement testing and brief neurological exam is normal.      Assessment & Plan:  Shortness of breath and dizziness-etiology unclear.  Suggested that she get CT cardiac scoring to determine if she has significant amount of atherosclerosis.  This would certainly clarify whether or not she needs statin medication since she has been reluctant to take that.  Allergic rhinitis with bronchospasm-we have found her a good price on albuterol inhaler at a local pharmacy.  With regard to Symbicort she may need a different type of inhaler and if her insurance company cannot provide her with an alternative we may need to refer her to allergist.  Shortness of breath may be due to that and she could benefit from allergy testing.  Patient had COVID-19 in July treated with Zithromax.  Records indicate only 3 COVID vaccines the last 1 in  December 2021  She had influenza vaccine in October 2022.  Dizziness-?  Vertigo or anxiety.  She does have Xanax on hand and should take it when she feels dizzy.

## 2021-10-27 LAB — LIPID PANEL
Cholesterol: 208 mg/dL — ABNORMAL HIGH (ref <200)
HDL: 40 mg/dL — ABNORMAL LOW (ref >=50)
LDL Cholesterol (Calc): 130 mg/dL (calc) — ABNORMAL HIGH
Non-HDL Cholesterol (Calc): 168 mg/dL (calc) — ABNORMAL HIGH (ref <130)
Total CHOL/HDL Ratio: 5.2 (calc) — ABNORMAL HIGH (ref <5.0)
Triglycerides: 233 mg/dL — ABNORMAL HIGH (ref <150)

## 2021-10-28 NOTE — Patient Instructions (Signed)
Recommend cardiac CT scoring to determine degree of atherosclerosis present.  This may help [place] determine whether or not you need to be on statin medication but I am leaning towards statin medication based on your lipid panel findings.  We have found you a good price on albuterol inhaler at a local pharmacy.  With regard to Symbicort, you may need a different type of inhaler that contains a steroid such as Symbicort.  If your insurance company cannot provide you with an alternative that is covered you may need to see allergist.  He may benefit from allergy testing anyway with recent shortness of breath.  Regarding dizziness, try Xanax to see if that helps.

## 2021-11-06 ENCOUNTER — Encounter: Payer: Self-pay | Admitting: Internal Medicine

## 2021-11-06 NOTE — Telephone Encounter (Signed)
Scheduled

## 2021-11-07 ENCOUNTER — Ambulatory Visit (HOSPITAL_COMMUNITY)
Admission: RE | Admit: 2021-11-07 | Discharge: 2021-11-07 | Disposition: A | Discharge status: Home / Self Care | Payer: Medicare PPO | Source: Ambulatory Visit | Attending: Internal Medicine | Admitting: Internal Medicine

## 2021-11-07 ENCOUNTER — Encounter (HOSPITAL_COMMUNITY): Payer: Self-pay

## 2021-11-07 ENCOUNTER — Other Ambulatory Visit: Payer: Self-pay

## 2021-11-07 DIAGNOSIS — Z136 Encounter for screening for cardiovascular disorders: Secondary | ICD-10-CM | POA: Insufficient documentation

## 2021-11-13 ENCOUNTER — Other Ambulatory Visit: Payer: Self-pay | Admitting: Obstetrics and Gynecology

## 2021-11-13 ENCOUNTER — Other Ambulatory Visit: Payer: Self-pay

## 2021-11-13 ENCOUNTER — Ambulatory Visit
Admission: RE | Admit: 2021-11-13 | Discharge: 2021-11-13 | Disposition: A | Discharge status: Home / Self Care | Payer: Medicare PPO | Source: Ambulatory Visit | Attending: Obstetrics and Gynecology | Admitting: Obstetrics and Gynecology

## 2021-11-13 DIAGNOSIS — N6489 Other specified disorders of breast: Secondary | ICD-10-CM

## 2021-11-13 DIAGNOSIS — R922 Inconclusive mammogram: Secondary | ICD-10-CM | POA: Diagnosis not present

## 2021-11-14 DIAGNOSIS — Z6826 Body mass index (BMI) 26.0-26.9, adult: Secondary | ICD-10-CM | POA: Diagnosis not present

## 2021-11-14 DIAGNOSIS — Z01419 Encounter for gynecological examination (general) (routine) without abnormal findings: Secondary | ICD-10-CM | POA: Diagnosis not present

## 2021-11-14 DIAGNOSIS — N959 Unspecified menopausal and perimenopausal disorder: Secondary | ICD-10-CM | POA: Diagnosis not present

## 2021-11-14 DIAGNOSIS — N951 Menopausal and female climacteric states: Secondary | ICD-10-CM | POA: Diagnosis not present

## 2021-11-15 ENCOUNTER — Other Ambulatory Visit: Payer: Self-pay

## 2021-11-15 ENCOUNTER — Ambulatory Visit (INDEPENDENT_AMBULATORY_CARE_PROVIDER_SITE_OTHER): Payer: Medicare PPO | Admitting: Internal Medicine

## 2021-11-15 ENCOUNTER — Encounter: Payer: Self-pay | Admitting: Internal Medicine

## 2021-11-15 VITALS — BP 128/76 | HR 63 | Temp 97.3°F | Wt 155.2 lb

## 2021-11-15 DIAGNOSIS — E782 Mixed hyperlipidemia: Secondary | ICD-10-CM

## 2021-11-15 MED ORDER — ROSUVASTATIN CALCIUM 5 MG PO TABS: ORAL_TABLET | ORAL | 3 refills | Status: DC

## 2021-11-15 NOTE — Patient Instructions (Addendum)
Begin Crestor 5 mg 3 times a week and RTC in June for follow up with lipid and liver panels.

## 2021-11-15 NOTE — Progress Notes (Signed)
° °  Subjective:    Patient ID: Robin Chaney, female    DOB: 1951-09-14, 71 y.o.   MRN: [id number]  HPI 71 year old Female seen for follow up on hyperlipidemia.  She had recent fasting lipid panel and total cholesterol was 208, HDL low at 40, triglycerides 233 improved from 352 when checked 5 months ago and LDL cholesterol was 130 but increased from 111 when checked 5 months ago.  She is disappointed she has not made a lot of progress.  She does not quite understand why but triglycerides have improved significantly but they are still greater than 150.  She had coronary CT scoring and her result was 0 which is excellent.  However I still think she would benefit from statin medication.  I would like for her to take Crestor 5 mg 3 times a week.  She will follow-up in June with lipid panel and liver functions.  Review of Systems     Objective:   Physical Exam Blood pressure is 128/76 pulse 63 pulse oximetry 96% weight 155 pounds  20-minute discussion today regarding lab results and recommendations       Assessment & Plan:   Hyperlipidemia-mixed  Plan: I would prefer that she be on daily statin but her coronary calcium score is 0.  She will take Crestor 5 mg 3 times a week and follow-up in July for lab only lipid panel and liver functions.

## 2021-12-10 ENCOUNTER — Telehealth: Payer: Self-pay | Admitting: Internal Medicine

## 2021-12-10 NOTE — Telephone Encounter (Signed)
Robin Chaney ?[phone] ? ?Robin Chaney called to say she still has some congestion, wheezing and coughing from her allergies, sometimes she coughs so hard it makes her throw up, and has changed her mind and would like to get the prednisone you offered her when she was last in. ? ?[place] ?

## 2021-12-10 NOTE — Telephone Encounter (Signed)
Scheduled appointment

## 2021-12-11 ENCOUNTER — Other Ambulatory Visit: Payer: Self-pay

## 2021-12-11 ENCOUNTER — Ambulatory Visit (INDEPENDENT_AMBULATORY_CARE_PROVIDER_SITE_OTHER): Payer: Medicare PPO | Admitting: Internal Medicine

## 2021-12-11 ENCOUNTER — Other Ambulatory Visit (HOSPITAL_COMMUNITY): Payer: Self-pay

## 2021-12-11 ENCOUNTER — Encounter: Payer: Self-pay | Admitting: Internal Medicine

## 2021-12-11 VITALS — BP 120/70 | HR 68 | Temp 98.2°F

## 2021-12-11 DIAGNOSIS — Z7184 Encounter for health counseling related to travel: Secondary | ICD-10-CM | POA: Diagnosis not present

## 2021-12-11 DIAGNOSIS — E781 Pure hyperglyceridemia: Secondary | ICD-10-CM

## 2021-12-11 DIAGNOSIS — Z8709 Personal history of other diseases of the respiratory system: Secondary | ICD-10-CM

## 2021-12-11 MED ORDER — BUDESONIDE-FORMOTEROL FUMARATE 160-4.5 MCG/ACT IN AERO
2.0000 | INHALATION_SPRAY | Freq: Two times a day (BID) | RESPIRATORY_TRACT | 11 refills | Status: DC
  Filled 2021-12-11 – 2021-12-31 (×2): qty 10.2, 30d supply, fill #0

## 2021-12-11 MED ORDER — PREDNISONE 10 MG (21) PO TBPK
ORAL_TABLET | ORAL | 1 refills | Status: DC
  Filled 2021-12-11: qty 21, 6d supply, fill #0

## 2021-12-11 MED ORDER — AZITHROMYCIN 250 MG PO TABS
ORAL_TABLET | ORAL | 0 refills | Status: AC
  Filled 2021-12-11: qty 6, 5d supply, fill #0

## 2021-12-11 NOTE — Progress Notes (Incomplete)
° °  Subjective:    Patient ID: Robin Chaney, female    DOB: 11/04/1950, 71 y.o.   MRN: [id number]  HPI 71 year old Female    Review of Systems out of PPI     Objective:   Physical Exam        Assessment & Plan:

## 2021-12-11 NOTE — Patient Instructions (Addendum)
I have prescribed Zithromax Z-PAK: 2 tabs day 1 followed by 1 tab days 2 through 5.  Prednisone 10 mg tabs to take in tapering course as directed starting with 6 tablets day 1 and decreasing by 1 tablet daily i.e. 6-5-4-3-2-1 taper.  She has 1 refill on the prednisone taper.  Symbicort 160-4.5 to use as needed for wheezing/shortness of breath. ?

## 2021-12-19 ENCOUNTER — Other Ambulatory Visit (HOSPITAL_COMMUNITY): Payer: Self-pay

## 2021-12-31 ENCOUNTER — Other Ambulatory Visit (HOSPITAL_COMMUNITY): Payer: Self-pay

## 2022-01-01 ENCOUNTER — Other Ambulatory Visit: Payer: Self-pay

## 2022-01-01 MED ORDER — BUDESONIDE-FORMOTEROL FUMARATE 160-4.5 MCG/ACT IN AERO: 2.0000 | INHALATION_SPRAY | Freq: Two times a day (BID) | RESPIRATORY_TRACT | 1 refills | Status: DC

## 2022-01-02 ENCOUNTER — Telehealth: Payer: Self-pay | Admitting: Internal Medicine

## 2022-01-02 NOTE — Telephone Encounter (Signed)
Robin Chaney ?[phone] ? ?[name] did not get Inhalers at [facility], she went ahead and got them at [facility] in pharmacy, so we may be getting a fax or call from them for refills. ?

## 2022-01-03 ENCOUNTER — Other Ambulatory Visit: Payer: Self-pay

## 2022-01-03 MED ORDER — ALBUTEROL SULFATE HFA 108 (90 BASE) MCG/ACT IN AERS: 2.0000 | INHALATION_SPRAY | Freq: Four times a day (QID) | RESPIRATORY_TRACT | 3 refills | Status: DC | PRN

## 2022-01-03 NOTE — Telephone Encounter (Signed)
Robin Chaney is calling back today to say she is having real bad asthma and needs her below inhaler sent to pharmacy. ? ?albuterol (VENTOLIN HFA) 108 (90 Base) MCG/ACT inhaler ? ?[place], [place] (Ph: [phone]) ?

## 2022-01-07 ENCOUNTER — Ambulatory Visit (INDEPENDENT_AMBULATORY_CARE_PROVIDER_SITE_OTHER): Payer: Medicare PPO | Admitting: Internal Medicine

## 2022-01-07 ENCOUNTER — Ambulatory Visit
Admission: RE | Admit: 2022-01-07 | Discharge: 2022-01-07 | Disposition: A | Discharge status: Home / Self Care | Payer: Medicare PPO | Source: Ambulatory Visit | Attending: Internal Medicine | Admitting: Internal Medicine

## 2022-01-07 ENCOUNTER — Telehealth: Payer: Self-pay | Admitting: Internal Medicine

## 2022-01-07 ENCOUNTER — Other Ambulatory Visit (HOSPITAL_COMMUNITY): Payer: Self-pay

## 2022-01-07 VITALS — BP 116/74 | HR 68 | Temp 98.0°F | Resp 16 | Ht 64.0 in | Wt 155.0 lb

## 2022-01-07 DIAGNOSIS — J22 Unspecified acute lower respiratory infection: Secondary | ICD-10-CM

## 2022-01-07 DIAGNOSIS — R062 Wheezing: Secondary | ICD-10-CM | POA: Diagnosis not present

## 2022-01-07 DIAGNOSIS — J9801 Acute bronchospasm: Secondary | ICD-10-CM

## 2022-01-07 DIAGNOSIS — R059 Cough, unspecified: Secondary | ICD-10-CM | POA: Diagnosis not present

## 2022-01-07 MED ORDER — CEFTRIAXONE SODIUM 1 G IJ SOLR
1.0000 g | Freq: Once | INTRAMUSCULAR | Status: AC
  Administered 2022-01-07: 1 g via INTRAMUSCULAR

## 2022-01-07 MED ORDER — HYDROCODONE BIT-HOMATROP MBR 5-1.5 MG/5ML PO SOLN
5.0000 mL | Freq: Three times a day (TID) | ORAL | 0 refills | Status: DC | PRN
  Filled 2022-01-07: qty 120, 8d supply, fill #0

## 2022-01-07 MED ORDER — METHYLPREDNISOLONE ACETATE 80 MG/ML IJ SUSP
80.0000 mg | Freq: Once | INTRAMUSCULAR | Status: AC
  Administered 2022-01-07: 80 mg via INTRAMUSCULAR

## 2022-01-07 MED ORDER — DOXYCYCLINE HYCLATE 100 MG PO TABS
100.0000 mg | ORAL_TABLET | Freq: Two times a day (BID) | ORAL | 0 refills | Status: DC
  Filled 2022-01-07: qty 20, 10d supply, fill #0

## 2022-01-07 NOTE — Telephone Encounter (Signed)
Robin Chaney ?[phone] ? ?[name] called to say she is getting worse, coughing, wheezing, SOB, has been in bed all weekend ?

## 2022-01-07 NOTE — Telephone Encounter (Signed)
COVID test was negative yesterday. Patient was ask to wear mask ?

## 2022-01-07 NOTE — Progress Notes (Addendum)
? ?  Subjective:  ? ? Patient ID: Robin Chaney, female    DOB: [birth year], 71 y.o.   MRN: [id number] ? 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 Has recently been on trip tp [place]. She traveled by airplane to [place] and drove to [place]. She took some day trips there.  She did a lot of hiking. ? ? She has a history of asthma.  She has not received ? Symbicort inhaler from mail order facility yet. ? ?Chest x-ray today shows peribronchial thickening seen in the setting of small airways disease.  No focal pulmonary opacity to suggest pneumonia. ? ? She began to have bronchospasm while out in [place] and had to be seen at an urgent care. Has been home now a few days and cannot get well . She has an albuterol inhaler.  ? ?She has never seen  an Allergist. Always had mild issues with asthma  but seldom requiring  an inhaler. ? ?History of hyperlipidemia.  She had a coronary CT score and result was 0 which is excellent.  However due to hypertriglyceridemia with level being 352 in September 2022 and 233 in January 2023, I have asked her to take Crestor 5 mg 3 times a week. ? ?Dr. Julien Girt is GYN physician.  Has taken Prometrium per GYN physician and Estrace. ? ?Has taken Allegra as needed for allergy symptoms.  Takes Ambien every night for sleep.  History of shift workers and insomnia. ? ?She had COVID-19 in July 2022. ? ?History of anxiety and depression treated with Lexapro. ? ?No known drug allergies. ? ?Social history: She is divorced.  College degree.  No children.  Does not smoke.  Social alcohol consumption. ? ?Family history: Father died at age 9 of lung cancer.  Mother died at age 22 of an MI with history of peripheral vascular disease and breast cancer.  2 brothers, 1 of whom has hypertension.  1 sister in good health. ? ? ? ?Review of Systems She has audible wheezing with inspiration today. ? ?   ?Objective:  ? Physical Exam ?Blood pressure 116/74 pulse oximetry 98% on room air temperature 98  degrees respiratory rate 16 pulse 68 and regular weight 155 pounds BMI 26.61 ? ?She has audible wheezing today in the office.  No rales are heard in the lungs today.  TMs are clear.  Pharynx is clear.  Neck is supple. ? ? ? ?   ?Assessment & Plan:  ? ?Exacerbation of allergic rhinitis and asthma on trip to [place] ? ?Audible wheezing in office today ? ?Needs allergy testing and evaluation by Allergist ? ?Plan: Referral to allergist.  She has an appointment to see Dr. Scherrie Bateman in June but perhaps could be placed on a cancellation list if she would call.  Today was given 1 g IM Rocephin and 80 mg IM Depo-Medrol.  Prescribed Hycodan 1 teaspoon every 8 hours as needed for cough and placed on doxycycline 100 mg twice daily for 10 days.  She has refill on albuterol inhaler and Symbicort inhaler. ? ? ?

## 2022-01-07 NOTE — Telephone Encounter (Signed)
scheduled

## 2022-01-07 NOTE — Patient Instructions (Addendum)
Albuterol nebulizer treatment given today. One gram IM Rocephin given in office with Depomedrol 80 mg IM. Sending in Medrol 4 mg  tabs with 12 day taper. Referral to Dr. Scherrie Bateman at [facility].Hycodan for cough -take sparingly. Doxycycline 100 mg twice daily x 10 days. CXR ordered and does not show pneumonia. ?

## 2022-01-08 ENCOUNTER — Encounter: Payer: Self-pay | Admitting: Internal Medicine

## 2022-01-16 ENCOUNTER — Other Ambulatory Visit (HOSPITAL_COMMUNITY): Payer: Self-pay

## 2022-01-19 ENCOUNTER — Other Ambulatory Visit (HOSPITAL_COMMUNITY): Payer: Self-pay

## 2022-01-23 ENCOUNTER — Telehealth: Payer: Self-pay | Admitting: Internal Medicine

## 2022-01-23 NOTE — Telephone Encounter (Signed)
Robin Chaney ?[phone] ? ?Robin Chaney called to say she has stopped taking budesonide-formoterol (SYMBICORT) 160-4.5 MCG/ACT inhaler, because of side effects, ( back pain, vaginal bleeding, white patches in her mouth) she stopped using it yesterday and has notice some difference already today. She looked up side effects on the Internet and these were all listed. I did ask if she needed to be seen and she said no, she thought she was alright since she was feeling better already. ?

## 2022-01-24 NOTE — Telephone Encounter (Signed)
LVM of what Dr Renold Genta had said, also ask if she had appointment with allergist, and to call back with how she is doing. ?

## 2022-01-24 NOTE — Telephone Encounter (Signed)
Called Robin Chaney back and she said she was on a wait list at the allergist office, and she also stated concern about the vaginal bleeding and I suggested she call GYN. She really does not want to do that. I said she could watch and see if symptoms get better by Monday. ?

## 2022-01-24 NOTE — Telephone Encounter (Signed)
Robin Chaney called back to say that she had started the Symbicort around 2 weeks ago, the bleeding started a week ago, still going on, she stopped Symbicort 3 days ago. The places in her mouth are not getting any worse, she is taking medication for back pain. She could not get an appointment with Allergist until June 12. ?

## 2022-01-26 ENCOUNTER — Encounter: Payer: Self-pay | Admitting: Internal Medicine

## 2022-01-29 ENCOUNTER — Encounter: Payer: Self-pay | Admitting: Allergy

## 2022-01-29 ENCOUNTER — Ambulatory Visit: Payer: Medicare PPO | Admitting: Allergy

## 2022-01-29 VITALS — BP 133/76 | HR 65 | Temp 98.0°F | Resp 18 | Ht 63.0 in | Wt 149.1 lb

## 2022-01-29 DIAGNOSIS — J4540 Moderate persistent asthma, uncomplicated: Secondary | ICD-10-CM

## 2022-01-29 DIAGNOSIS — B999 Unspecified infectious disease: Secondary | ICD-10-CM | POA: Diagnosis not present

## 2022-01-29 DIAGNOSIS — J3089 Other allergic rhinitis: Secondary | ICD-10-CM

## 2022-01-29 MED ORDER — RYALTRIS 665-25 MCG/ACT NA SUSP: 1.0000 | Freq: Two times a day (BID) | NASAL | 0 refills | Status: DC

## 2022-01-29 MED ORDER — ALVESCO 160 MCG/ACT IN AERS: 1.0000 | INHALATION_SPRAY | Freq: Two times a day (BID) | RESPIRATORY_TRACT | 0 refills | Status: DC

## 2022-01-29 NOTE — Assessment & Plan Note (Addendum)
Issue with her breathing on and off but the past 3 months significantly worsened with post tussive emesis and wheezing. Had 2 steroid injections and 2 oral prednisone. 2023 CXR showed peribronchial thickening. Using albuterol once a day at least. Used Symbicort for 1 week then stopped as she was concerned if vaginal bleeding was caused by this inhaler. Covid-19 infection 1 year ago. ?? Today's spirometry showed: possible restrictive disease with 6% improvement in FEV1 post bronchodilator treatment. Clinically feeling improved. No coughing post treatment.  ?? Daily controller medication(s): START Alvesco 148mg 1 puff twice a day with spacer and rinse mouth afterwards. Sample given. ?o Spacer given and demonstrated proper use with inhaler. Patient understood technique and all questions/concerned were addressed.  ?o Let me know if this works well and I will send in a prescription for you.  ?o Discussed that Alvesco is a prodrug with minimal systemic absorption.  ?? May use albuterol rescue inhaler 2 puffs every 4 to 6 hours as needed for shortness of breath, chest tightness, coughing, and wheezing. May use albuterol rescue inhaler 2 puffs 5 to 15 minutes prior to strenuous physical activities. Monitor frequency of use.  ?? Get spirometry at next visit. ?

## 2022-01-29 NOTE — Patient Instructions (Addendum)
Breathing  ?Daily controller medication(s): START Alvesco 152mg 1 puff twice a day with spacer and rinse mouth afterwards. Sample given. ?Spacer given and demonstrated proper use with inhaler. Patient understood technique and all questions/concerned were addressed.  ?Let me know if this works well and I will send in a prescription for you.  ?May use albuterol rescue inhaler 2 puffs every 4 to 6 hours as needed for shortness of breath, chest tightness, coughing, and wheezing. May use albuterol rescue inhaler 2 puffs 5 to 15 minutes prior to strenuous physical activities. Monitor frequency of use.  ?Breathing control goals:  ?Full participation in all desired activities (may need albuterol before activity) ?Albuterol use two times or less a week on average (not counting use with activity) ?Cough interfering with sleep two times or less a month ?Oral steroids no more than once a year ?No hospitalizations  ? ?Environmental allergies ?Unable to skin test today due to recent antihistamine intake. ?Will get bloodwork. ?Use over the counter antihistamines such as Zyrtec (cetirizine), Claritin (loratadine), Allegra (fexofenadine), or Xyzal (levocetirizine) daily as needed. May take twice a day during allergy flares. May switch antihistamines every few months. ?Start Ryaltris (olopatadine + mometasone nasal spray combination) 1-2 sprays per nostril twice a day. Sample given. ?Let me know if this works well for you and will send in a prescription then.  ?Nasal saline spray (i.e., Simply Saline) or nasal saline lavage (i.e., NeilMed) is recommended as needed and prior to medicated nasal sprays. ? ?Recurrent infections ?Keep track of infections and antibiotics use. ?Get bloodwork to look at immune system.  ? ?Follow up in 2 months or sooner if needed [facility].  ? ? ?

## 2022-01-29 NOTE — Assessment & Plan Note (Signed)
Frequent bronchitis episodes. Up to date with flu, pneumonia and Covid-19 vaccines.  ?? Keep track of infections and antibiotics use. ?? Get bloodwork to look at immune system.  ?

## 2022-01-29 NOTE — Progress Notes (Signed)
? ?New Patient Note ? ?RE: Robin Chaney MRN: [id number] DOB: [birth year] ?Date of Office Visit: 01/29/2022 ? ?Consult requested by: Elby Showers, MD ?Primary care provider: Elby Showers, MD ? ?Chief Complaint: Asthma (Has been on her albuterol and Symbicort inhalers. She is still having issues with coughing. Has been on Symbicort for 2 week with sores in her mouth, vaginal bleeding, coughing with vomit. She is on probiotics it has helped with thrush/ mouth sores,and now has vaginal spotting vs. Full bleeding flares. Was on prednisone, doxycyline, and steroid shots while taking the symbicort. Completed all medications and is only taking claritin  ) and Allergy Testing (Is currently on her allergy medication - took Claritin yesterday. /Had allergy testing done years ago with dr. Mertha Finders - she use to administer her own immunotherapy shots.  During that time she would get a lot of sinus infections/ bronchial infections which later turned into asthma ) ? ?History of Present Illness: ?I had the pleasure of seeing Robin Chaney for initial evaluation at the [facility] on 01/29/2022. She is a 71 y.o. female, who is referred here by Elby Showers, MD for the evaluation of asthma. ? ?Asthma:  ?2-3 months ago patient started coughing with post tussive emesis. ?She was treated with antibiotics and prednisone. ?She went to [place] for a trip and went to [facility] where she was given a steroid injection. ? ?She then went to see her PCP and treated with another course of prednisone and antibiotics.  ?Started on Symbicort and albuterol. After about 1 week she noticed vaginal bleeding with cramping, back pain, oral thrush. She stopped Symbicort and using albuterol as needed now. The vaginal issues improved but concerned about starting another inhaler.  ? ?She reports symptoms of chest tightness, shortness of breath, coughing with post tussive emesis, wheezing, nocturnal awakenings on and off throughout the years  which got worse 2-3 months ago. She did have inhalers in the past for occasional flares in the past. Current medications include albuterol prn which help. She reports not using aerochamber with inhalers. She tried the following inhalers: some samples but not sure. Main triggers are unknown but worse with infections and being outdoors.  ? ?In the last month, frequency of symptoms: daily. Frequency of nocturnal symptoms: depends. Frequency of SABA use: once per day. Interference with physical activity: no. Sleep is disturbed. In the last 12 months, emergency room visits/urgent care visits/doctor office visits or hospitalizations due to respiratory issues: 3. In the last 12 months, oral steroids courses: 2 steroid injections, 2 oral steroids. Lifetime history of hospitalization for respiratory issues: no. Prior intubations: no. History of pneumonia: yes. She was evaluated by pulmonologist in the past. Smoking exposure: no. Up to date with flu vaccine: yes. Up to date with pneumonia vaccine: yes. Up to date with COVID-19 vaccine: yes. Prior Covid-19 infection: 1 year ago. ?History of reflux: sometime takes OTC medications. ? ?Rhinitis: ?She reports symptoms of sneezing, coughing, rhinorrhea, itchy eyes. Symptoms have been going on for many years. The symptoms are present all year around with worsening in spring. Headache: sometimes. She has used nettipot, allegra, zyrtec with some improvement in symptoms. Sinus infections: no. Previous work up includes: skin testing was positive to grass and trees per patient report. She was on AIT for 1 year which helped. ?Previous ENT evaluation: no. ?Previous sinus imaging: no. ?History of nasal polyps: no. ?Last eye exam: last year. ? 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 Has recently been on trip tp [place]. She traveled by airplane to [place] and drove to [place]. She took some day trips there.  She did a lot of hiking. ?  ? She has a  history of asthma.  She has not received ? Symbicort inhaler from mail order facility yet. ? ?Chest x-ray today shows peribronchial thickening seen in the setting of small airways disease.  No focal pulmonary opacity to suggest pneumonia. ?  ? She began to have bronchospasm while out in [place] and had to be seen at an urgent care. Has been home now a few days and cannot get well . She has an albuterol inhaler.  ?  ?She has never seen  an Allergist. Always had mild issues with asthma  but seldom requiring  an inhaler." ? ?01/07/2022 CXR: ?"Peribronchial thickening, which can be seen in the setting of small ?airways disease (viral versus reactive). No focal pulmonary opacity ?to suggest pneumonia." ? ?Assessment and Plan: ?Jisele is a 71 y.o. female with: ?Not well controlled moderate persistent asthma ?Issue with her breathing on and off but the past 3 months significantly worsened with post tussive emesis and wheezing. Had 2 steroid injections and 2 oral prednisone. 2023 CXR showed peribronchial thickening. Using albuterol once a day at least. Used Symbicort for 1 week then stopped as she was concerned if vaginal bleeding was caused by this inhaler. Covid-19 infection 1 year ago. ?Today's spirometry showed: possible restrictive disease with 6% improvement in FEV1 post bronchodilator treatment. Clinically feeling improved. No coughing post treatment.  ?Daily controller medication(s): START Alvesco 155mg 1 puff twice a day with spacer and rinse mouth afterwards. Sample given. ?Spacer given and demonstrated proper use with inhaler. Patient understood technique and all questions/concerned were addressed.  ?Let me know if this works well and I will send in a prescription for you.  ?Discussed that Alvesco is a prodrug with minimal systemic absorption.  ?May use albuterol rescue inhaler 2 puffs every 4 to 6 hours as needed for shortness of breath, chest tightness, coughing, and wheezing. May use albuterol rescue inhaler 2  puffs 5 to 15 minutes prior to strenuous physical activities. Monitor frequency of use.  ?Get spirometry at next visit. ? ?Other allergic rhinitis ?Rhino conjunctivitis symptoms for many years with flares in the spring. Used to be on AIT for 1 year with good benefit. No recent testing. ?Unable to skin test today due to recent antihistamine intake. ?Will get bloodwork. ?Use over the counter antihistamines such as Zyrtec (cetirizine), Claritin (loratadine), Allegra (fexofenadine), or Xyzal (levocetirizine) daily as needed. May take twice a day during allergy flares. May switch antihistamines every few months. ?Start Ryaltris (olopatadine + mometasone nasal spray combination) 1-2 sprays per nostril twice a day. Sample given. ?Let me know if this works well for you and will send in a prescription then.  ?Nasal saline spray (i.e., Simply Saline) or nasal saline lavage (i.e., NeilMed) is recommended as needed and prior to medicated nasal sprays. ? ?Recurrent infections ?Frequent bronchitis episodes. Up to date with flu, pneumonia and Covid-19 vaccines.  ?Keep track of infections and antibiotics use. ?Get bloodwork to look at immune system.  ? ?Return in about 2 months (around 03/31/2022). ? ?Meds ordered this encounter  ?Medications  ? ciclesonide (ALVESCO) 160 MCG/ACT inhaler  ?  Sig: Inhale 1 puff into the lungs 2 (two) times daily.  ?  Dispense:  1 each  ?  Refill:  0  ? Olopatadine-Mometasone (RYALTRIS)  498-26 MCG/ACT SUSP  ?  Sig: Place 1-2 sprays into the nose in the morning and at bedtime.  ?  Dispense:  29 g  ?  Refill:  0  ? ?Lab Orders    ?     CBC with Differential/Platelet    ?     Alpha-1-antitrypsin    ?     Allergens w/Total IgE Area 2    ?     Complement, total    ?     IgG, IgA, IgM    ?     Diphtheria / Tetanus Antibody Panel    ?     Strep pneumoniae 23 Serotypes IgG    ? ? ?Other allergy screening: ?Food allergy: no ?Medication allergy: no ?Hymenoptera allergy: no ?Urticaria: no ?Eczema:no ?History of  recurrent infections suggestive of immunodeficency:  ?Patient has history multiple infections including sinus infection, pneumonia, bronchitis. Denies any GI infections/diarrhea, skin infections/abscesses.

## 2022-01-29 NOTE — Assessment & Plan Note (Signed)
Rhino conjunctivitis symptoms for many years with flares in the spring. Used to be on AIT for 1 year with good benefit. No recent testing. ?? Unable to skin test today due to recent antihistamine intake. ?? Will get bloodwork. ?? Use over the counter antihistamines such as Zyrtec (cetirizine), Claritin (loratadine), Allegra (fexofenadine), or Xyzal (levocetirizine) daily as needed. May take twice a day during allergy flares. May switch antihistamines every few months. ?? Start Ryaltris (olopatadine + mometasone nasal spray combination) 1-2 sprays per nostril twice a day. Sample given. ?o Let me know if this works well for you and will send in a prescription then.  ?? Nasal saline spray (i.e., Simply Saline) or nasal saline lavage (i.e., NeilMed) is recommended as needed and prior to medicated nasal sprays. ?

## 2022-02-04 ENCOUNTER — Other Ambulatory Visit: Payer: Self-pay | Admitting: Internal Medicine

## 2022-02-04 DIAGNOSIS — J4540 Moderate persistent asthma, uncomplicated: Secondary | ICD-10-CM | POA: Diagnosis not present

## 2022-02-04 DIAGNOSIS — B999 Unspecified infectious disease: Secondary | ICD-10-CM | POA: Diagnosis not present

## 2022-02-04 DIAGNOSIS — J3089 Other allergic rhinitis: Secondary | ICD-10-CM | POA: Diagnosis not present

## 2022-02-07 ENCOUNTER — Telehealth: Payer: Self-pay | Admitting: Allergy

## 2022-02-07 ENCOUNTER — Other Ambulatory Visit: Payer: Self-pay

## 2022-02-07 ENCOUNTER — Other Ambulatory Visit (HOSPITAL_COMMUNITY): Payer: Self-pay

## 2022-02-07 MED ORDER — MONTELUKAST SODIUM 10 MG PO TABS
10.0000 mg | ORAL_TABLET | Freq: Every day | ORAL | 5 refills | Status: DC
  Filled 2022-02-07: qty 30, 30d supply, fill #0

## 2022-02-07 MED ORDER — MONTELUKAST SODIUM 10 MG PO TABS: 10.0000 mg | ORAL_TABLET | Freq: Every day | ORAL | 0 refills | Status: DC

## 2022-02-07 NOTE — Telephone Encounter (Signed)
Please call patient. ? ?Let's start Singulair which is a pill that is a non-steroidal. ? ?I will send in Rx for this. ? ?If this does not work as well, then we can try a non-steroidal inhaler called Spiriva next. ? ?Stop Alvesco. ? ?Start Singulair (montelukast) '10mg'$  daily at night. ?Cautioned that in some children/adults can experience behavioral changes including hyperactivity, agitation, depression, sleep disturbances and suicidal ideations. These side effects are rare, but if you notice them you should notify me and discontinue Singulair (montelukast). ? ? ?

## 2022-02-07 NOTE — Telephone Encounter (Signed)
Lm for pt to call us back about this °

## 2022-02-07 NOTE — Telephone Encounter (Signed)
Pt informed and stated understanding

## 2022-02-07 NOTE — Telephone Encounter (Signed)
Robin Chaney called in and states she finished the sample of Alvesco and really liked it.  However, Robin Chaney states she has a side affect with steroid use.  Robin Chaney states she has vaginal bleeding.  Robin Chaney states her vaginal bleeding stopped but noticed while taking Alvesco, the vaginal bleeding has started back again.  Robin Chaney really likes the Alvesco but does not want to deal with the vaginal bleeding.  Robin Chaney wants to know is there anything else she can try in its place? ? ?

## 2022-02-08 ENCOUNTER — Other Ambulatory Visit: Payer: Self-pay

## 2022-02-08 ENCOUNTER — Other Ambulatory Visit (HOSPITAL_COMMUNITY): Payer: Self-pay

## 2022-02-08 MED ORDER — MONTELUKAST SODIUM 10 MG PO TABS
10.0000 mg | ORAL_TABLET | Freq: Every day | ORAL | 0 refills | Status: DC
  Filled 2022-02-08: qty 30, 30d supply, fill #0

## 2022-02-08 MED ORDER — MONTELUKAST SODIUM 10 MG PO TABS: 10.0000 mg | ORAL_TABLET | Freq: Every day | ORAL | 0 refills | Status: DC

## 2022-02-09 LAB — ALLERGENS W/TOTAL IGE AREA 2
Alternaria Alternata IgE: <0.1 kU/L
Aspergillus Fumigatus IgE: 0.45 kU/L — AB
Bermuda Grass IgE: 0.11 kU/L — AB
Cat Dander IgE: <0.1 kU/L
Cedar, Mountain IgE: <0.1 kU/L
Cladosporium Herbarum IgE: <0.1 kU/L
Cockroach, German IgE: <0.1 kU/L
Common Silver Birch IgE: <0.1 kU/L
Cottonwood IgE: <0.1 kU/L
D Farinae IgE: <0.1 kU/L
D Pteronyssinus IgE: <0.1 kU/L
Dog Dander IgE: 0.8 kU/L — AB
Elm, American IgE: <0.1 kU/L
IgE (Immunoglobulin E), Serum: 365 IU/mL (ref 6–495)
Johnson Grass IgE: <0.1 kU/L
Maple/Box Elder IgE: <0.1 kU/L
Mouse Urine IgE: <0.1 kU/L
Oak, White IgE: <0.1 kU/L
Pecan, Hickory IgE: <0.1 kU/L
Penicillium Chrysogen IgE: <0.1 kU/L
Pigweed, Rough IgE: <0.1 kU/L
Ragweed, Short IgE: <0.1 kU/L
Sheep Sorrel IgE Qn: <0.1 kU/L
Timothy Grass IgE: 8.72 kU/L — AB
White Mulberry IgE: <0.1 kU/L

## 2022-02-09 LAB — CBC WITH DIFFERENTIAL/PLATELET
Basophils Absolute: 0.1 10*3/uL (ref 0.0–0.2)
Basos: 2 %
EOS (ABSOLUTE): 0 10*3/uL (ref 0.0–0.4)
Eos: 1 %
Hematocrit: 40.5 % (ref 34.0–46.6)
Hemoglobin: 13.7 g/dL (ref 11.1–15.9)
Immature Grans (Abs): 0 10*3/uL (ref 0.0–0.1)
Immature Granulocytes: 0 %
Lymphocytes Absolute: 0.9 10*3/uL (ref 0.7–3.1)
Lymphs: 30 %
MCH: 29.6 pg (ref 26.6–33.0)
MCHC: 33.8 g/dL (ref 31.5–35.7)
MCV: 88 fL (ref 79–97)
Monocytes Absolute: 0.6 10*3/uL (ref 0.1–0.9)
Monocytes: 19 %
Neutrophils Absolute: 1.5 10*3/uL (ref 1.4–7.0)
Neutrophils: 48 %
Platelets: 307 10*3/uL (ref 150–450)
RBC: 4.63 x10E6/uL (ref 3.77–5.28)
RDW: 14.4 % (ref 11.7–15.4)
WBC: 3.1 10*3/uL — ABNORMAL LOW (ref 3.4–10.8)

## 2022-02-09 LAB — STREP PNEUMONIAE 23 SEROTYPES IGG
Pneumo Ab Type 1*: 3.5 ug/mL (ref >1.3)
Pneumo Ab Type 12 (12F)*: <0.1 ug/mL — ABNORMAL LOW (ref >1.3)
Pneumo Ab Type 14*: 3.1 ug/mL (ref >1.3)
Pneumo Ab Type 17 (17F)*: 4.6 ug/mL (ref >1.3)
Pneumo Ab Type 19 (19F)*: 4.6 ug/mL (ref >1.3)
Pneumo Ab Type 2*: 10.3 ug/mL (ref >1.3)
Pneumo Ab Type 20*: 5.2 ug/mL (ref >1.3)
Pneumo Ab Type 22 (22F)*: 2.6 ug/mL (ref >1.3)
Pneumo Ab Type 23 (23F)*: 11.2 ug/mL (ref >1.3)
Pneumo Ab Type 26 (6B)*: 0.9 ug/mL — ABNORMAL LOW (ref >1.3)
Pneumo Ab Type 3*: 0.4 ug/mL — ABNORMAL LOW (ref >1.3)
Pneumo Ab Type 34 (10A)*: 19.2 ug/mL (ref >1.3)
Pneumo Ab Type 4*: 0.2 ug/mL — ABNORMAL LOW (ref >1.3)
Pneumo Ab Type 43 (11A)*: 0.7 ug/mL — ABNORMAL LOW (ref >1.3)
Pneumo Ab Type 5*: 0.6 ug/mL — ABNORMAL LOW (ref >1.3)
Pneumo Ab Type 51 (7F)*: 0.5 ug/mL — ABNORMAL LOW (ref >1.3)
Pneumo Ab Type 54 (15B)*: 4.6 ug/mL (ref >1.3)
Pneumo Ab Type 56 (18C)*: 0.8 ug/mL — ABNORMAL LOW (ref >1.3)
Pneumo Ab Type 57 (19A)*: 14.1 ug/mL (ref >1.3)
Pneumo Ab Type 68 (9V)*: 1 ug/mL — ABNORMAL LOW (ref >1.3)
Pneumo Ab Type 70 (33F)*: 3.4 ug/mL (ref >1.3)
Pneumo Ab Type 8*: 4.5 ug/mL (ref >1.3)
Pneumo Ab Type 9 (9N)*: 3.5 ug/mL (ref >1.3)

## 2022-02-09 LAB — SPECIMEN STATUS REPORT

## 2022-02-09 LAB — IGG, IGA, IGM
IgA/Immunoglobulin A, Serum: 370 mg/dL (ref 64–422)
IgG (Immunoglobin G), Serum: 873 mg/dL (ref 586–1602)
IgM (Immunoglobulin M), Srm: 79 mg/dL (ref 26–217)

## 2022-02-09 LAB — ALPHA-1-ANTITRYPSIN: A-1 Antitrypsin: 132 mg/dL (ref 101–187)

## 2022-02-09 LAB — COMPLEMENT, TOTAL: Compl, Total (CH50): 49 U/mL (ref >41)

## 2022-02-12 ENCOUNTER — Other Ambulatory Visit (HOSPITAL_COMMUNITY): Payer: Self-pay

## 2022-02-12 ENCOUNTER — Other Ambulatory Visit: Payer: Self-pay | Admitting: Allergy

## 2022-02-12 DIAGNOSIS — Z79890 Hormone replacement therapy (postmenopausal): Secondary | ICD-10-CM | POA: Diagnosis not present

## 2022-02-12 DIAGNOSIS — N950 Postmenopausal bleeding: Secondary | ICD-10-CM | POA: Diagnosis not present

## 2022-02-12 DIAGNOSIS — B999 Unspecified infectious disease: Secondary | ICD-10-CM

## 2022-02-12 MED ORDER — PROGESTERONE 200 MG PO CAPS
ORAL_CAPSULE | ORAL | 4 refills | Status: DC
  Filled 2022-02-12: qty 30, 30d supply, fill #0
  Filled 2022-03-13: qty 30, 30d supply, fill #1

## 2022-02-26 DIAGNOSIS — N950 Postmenopausal bleeding: Secondary | ICD-10-CM | POA: Diagnosis not present

## 2022-03-06 ENCOUNTER — Other Ambulatory Visit: Payer: Self-pay | Admitting: Allergy

## 2022-03-13 ENCOUNTER — Other Ambulatory Visit (HOSPITAL_COMMUNITY): Payer: Self-pay

## 2022-03-13 ENCOUNTER — Telehealth: Payer: Self-pay | Admitting: Internal Medicine

## 2022-03-13 NOTE — Telephone Encounter (Signed)
Robin Chaney [phone]  [name] wants to come in she is having increased lower back pain and arthritic pain, the Celebrex does not seem to be helping anymore. She wants to also give you an update on upcoming [facility].

## 2022-03-14 ENCOUNTER — Encounter: Payer: Self-pay | Admitting: Internal Medicine

## 2022-03-14 ENCOUNTER — Ambulatory Visit (INDEPENDENT_AMBULATORY_CARE_PROVIDER_SITE_OTHER): Payer: Medicare PPO | Admitting: Internal Medicine

## 2022-03-14 ENCOUNTER — Other Ambulatory Visit (HOSPITAL_COMMUNITY): Payer: Self-pay

## 2022-03-14 VITALS — BP 118/70 | HR 76 | Temp 97.8°F | Ht 63.0 in | Wt 151.5 lb

## 2022-03-14 DIAGNOSIS — Z8709 Personal history of other diseases of the respiratory system: Secondary | ICD-10-CM | POA: Diagnosis not present

## 2022-03-14 DIAGNOSIS — N939 Abnormal uterine and vaginal bleeding, unspecified: Secondary | ICD-10-CM

## 2022-03-14 DIAGNOSIS — F419 Anxiety disorder, unspecified: Secondary | ICD-10-CM

## 2022-03-14 DIAGNOSIS — F32A Depression, unspecified: Secondary | ICD-10-CM

## 2022-03-14 DIAGNOSIS — E781 Pure hyperglyceridemia: Secondary | ICD-10-CM | POA: Diagnosis not present

## 2022-03-14 DIAGNOSIS — G4700 Insomnia, unspecified: Secondary | ICD-10-CM

## 2022-03-14 DIAGNOSIS — M545 Low back pain, unspecified: Secondary | ICD-10-CM | POA: Diagnosis not present

## 2022-03-14 MED ORDER — MELOXICAM 15 MG PO TABS: 15.0000 mg | ORAL_TABLET | Freq: Every day | ORAL | 0 refills | Status: DC

## 2022-03-14 MED ORDER — METHYLPREDNISOLONE 4 MG PO TBPK
ORAL_TABLET | ORAL | 0 refills | Status: DC
  Filled 2022-03-14: qty 21, 6d supply, fill #0

## 2022-03-14 NOTE — Progress Notes (Signed)
   Subjective:    Patient ID: Robin Chaney, female    DOB: October 06, 1951, 71 y.o.   MRN: [id number]  HPI 71 year old Female seen for low back pain without radiculopathy.  Does not recall any heavy lifting or exertional activity that would have triggered the pain.  History of musculoskeletal pain treated with Mobic.  Recently saw Allergist after returning from trip to [place].  Was treated with Alvesco which helped her symptoms but this has been discontinued as she developed vaginal bleeding.  I do not think inhaled corticosteroids caused her vaginal bleeding.  Singulair helps.  Symptoms are worse at night.  She sleeps with a dog.  She does use oral antihistamines.  Has been placed on Ryaltris containing olopatadine and mometasone nasal spray twice daily.  Review of Systems: Having D and C in July for postmenopausal bleeding.     Objective:   Physical Exam Vital signs reviewed and are stable. Neck is supple without JVD thyromegaly or carotid bruits.  Chest is clear.  Cardiac exam: Regular rate and rhythm.  No lower extremity pitting edema.      Assessment & Plan:  Recent vaginal bleeding is to have D&C in July  Musculoskeletal low back pain-given prescription for Mobic 15 mg daily.  If pain is severe she may take a short tapering course of prednisone.    Allergic rhinitis seen by Allergist-see above for details  History of anxiety/depression treated with Lexapro 20 mg daily  History of insomnia treated with Ambien  Hyperlipidemia treated with Crestor 5 mg 3 times a week  Plan: Triglycerides are elevated at 206.  10 months ago they were 352.  She does not want to increase dose of Crestor.  We could try Tricor instead but she will consider this.  She is more concerned about her upcoming surgery at this time.  Liver functions are completely normal.  I think she is stable from a medical standpoint to have D&C in July.  Her Medicare wellness visit is due in early September.  This is  currently not scheduled We will need to find a time for that.

## 2022-03-14 NOTE — Telephone Encounter (Signed)
sCHEDULE

## 2022-03-14 NOTE — Telephone Encounter (Signed)
LVM for Elenora to [facility] for appointment today.

## 2022-03-18 ENCOUNTER — Ambulatory Visit: Payer: Medicare PPO | Admitting: Allergy

## 2022-03-22 ENCOUNTER — Other Ambulatory Visit: Payer: Medicare PPO

## 2022-03-22 DIAGNOSIS — E781 Pure hyperglyceridemia: Secondary | ICD-10-CM | POA: Diagnosis not present

## 2022-03-23 LAB — HEPATIC FUNCTION PANEL
AG Ratio: 1.9 (calc) (ref 1.0–2.5)
ALT: 13 U/L (ref 6–29)
AST: 14 U/L (ref 10–35)
Albumin: 3.9 g/dL (ref 3.6–5.1)
Alkaline phosphatase (APISO): 59 U/L (ref 37–153)
Bilirubin, Direct: 0.1 mg/dL (ref 0.0–0.2)
Globulin: 2.1 g/dL (calc) (ref 1.9–3.7)
Indirect Bilirubin: 0.3 mg/dL (calc) (ref 0.2–1.2)
Total Bilirubin: 0.4 mg/dL (ref 0.2–1.2)
Total Protein: 6 g/dL — ABNORMAL LOW (ref 6.1–8.1)

## 2022-03-23 LAB — LIPID PANEL
Cholesterol: 175 mg/dL (ref <200)
HDL: 65 mg/dL (ref >=50)
LDL Cholesterol (Calc): 80 mg/dL (calc)
Non-HDL Cholesterol (Calc): 110 mg/dL (calc) (ref <130)
Total CHOL/HDL Ratio: 2.7 (calc) (ref <5.0)
Triglycerides: 206 mg/dL — ABNORMAL HIGH (ref <150)

## 2022-04-08 NOTE — Progress Notes (Unsigned)
Follow Up Note  RE: Robin Chaney MRN: [id number] DOB: 07-Nov-1950 Date of Office Visit: 04/10/2022  Referring provider: Elby Showers, MD Primary care provider: Elby Showers, MD  Chief Complaint: No chief complaint on file.  History of Present Illness: I had the pleasure of seeing Robin Chaney for a follow up visit at the [facility] on 04/08/2022. She is a 71 y.o. female, who is being followed for asthma, allergic rhinitis and recurrent infections. Her previous allergy office visit was on 01/29/2022 with Dr. Maudie Mercury. Today is a regular follow up visit.  Let's start Singulair which is a pill that is a non-steroidal.   I will send in Rx for this.   If this does not work as well, then we can try a non-steroidal inhaler called Spiriva next.   Stop Alvesco.  Robin Chaney called in and states she finished the sample of Alvesco and really liked it.  However, Robin Chaney states she has a side affect with steroid use.  Robin Chaney states she has vaginal bleeding.    Not well controlled moderate persistent asthma Issue with her breathing on and off but the past 3 months significantly worsened with post tussive emesis and wheezing. Had 2 steroid injections and 2 oral prednisone. 2023 CXR showed peribronchial thickening. Using albuterol once a day at least. Used Symbicort for 1 week then stopped as she was concerned if vaginal bleeding was caused by this inhaler. Covid-19 infection 1 year ago. Today's spirometry showed: possible restrictive disease with 6% improvement in FEV1 post bronchodilator treatment. Clinically feeling improved. No coughing post treatment.  Daily controller medication(s): START Alvesco 181mg 1 puff twice a day with spacer and rinse mouth afterwards. Sample given. Spacer given and demonstrated proper use with inhaler. Patient understood technique and all questions/concerned were addressed.  Let me know if this works well and I will send in a prescription for you.   Discussed that Alvesco is a prodrug with minimal systemic absorption.  May use albuterol rescue inhaler 2 puffs every 4 to 6 hours as needed for shortness of breath, chest tightness, coughing, and wheezing. May use albuterol rescue inhaler 2 puffs 5 to 15 minutes prior to strenuous physical activities. Monitor frequency of use.  Get spirometry at next visit.   Other allergic rhinitis Rhino conjunctivitis symptoms for many years with flares in the spring. Used to be on AIT for 1 year with good benefit. No recent testing. Unable to skin test today due to recent antihistamine intake. Will get bloodwork. Use over the counter antihistamines such as Zyrtec (cetirizine), Claritin (loratadine), Allegra (fexofenadine), or Xyzal (levocetirizine) daily as needed. May take twice a day during allergy flares. May switch antihistamines every few months. Start Ryaltris (olopatadine + mometasone nasal spray combination) 1-2 sprays per nostril twice a day. Sample given. Let me know if this works well for you and will send in a prescription then.  Nasal saline spray (i.e., Simply Saline) or nasal saline lavage (i.e., NeilMed) is recommended as needed and prior to medicated nasal sprays.   Recurrent infections Frequent bronchitis episodes. Up to date with flu, pneumonia and Covid-19 vaccines.  Keep track of infections and antibiotics use. Get bloodwork to look at immune system.    Return in about 2 months (around 03/31/2022).  Environmental allergy panel was positive to dog, grass pollen and mold.  See below for environmental control measures.   Your immunoglobulin levels were normal which is great. You also have good protection against diptheria,  tetanus and pneumoniae.  No immune issues based on these results.   Your white count was slightly lower than normal but looks like it was the same value 8 months ago.   I recommend re-checking this about 2 weeks before the next visit. I will order the lab and mail  the order to your home.    Keep track of infections/antibiotics use.   Assessment and Plan: Robin Chaney is a 71 y.o. female with: No problem-specific Assessment & Plan notes found for this encounter.  No follow-ups on file.  No orders of the defined types were placed in this encounter.  Lab Orders  No laboratory test(s) ordered today    Diagnostics: Spirometry:  Tracings reviewed. Her effort: {Blank single:19197::"Good reproducible efforts.","It was hard to get consistent efforts and there is a question as to whether this reflects a maximal maneuver.","Poor effort, data can not be interpreted."} FVC: ***L FEV1: ***L, ***% predicted FEV1/FVC ratio: ***% Interpretation: {Blank single:19197::"Spirometry consistent with mild obstructive disease","Spirometry consistent with moderate obstructive disease","Spirometry consistent with severe obstructive disease","Spirometry consistent with possible restrictive disease","Spirometry consistent with mixed obstructive and restrictive disease","Spirometry uninterpretable due to technique","Spirometry consistent with normal pattern","No overt abnormalities noted given today's efforts"}.  Please see scanned spirometry results for details.  Skin Testing: {Blank single:19197::"Select foods","Environmental allergy panel","Environmental allergy panel and select foods","Food allergy panel","None","Deferred due to recent antihistamines use"}. *** Results discussed with patient/family.   Medication List:  Current Outpatient Medications  Medication Sig Dispense Refill   albuterol (VENTOLIN HFA) 108 (90 Base) MCG/ACT inhaler Inhale 2 puffs into the lungs every 6 (six) hours as needed for wheezing or shortness of breath. 54 g 3   Cholecalciferol (VITAMIN D3 PO) Take 1,000 Units by mouth daily.      ciclesonide (ALVESCO) 160 MCG/ACT inhaler Inhale 1 puff into the lungs 2 (two) times daily. 1 each 0   escitalopram (LEXAPRO) 20 MG tablet TAKE 1 TABLET EVERY DAY 90  tablet 3   estradiol (ESTRACE) 0.5 MG tablet TAKE 1 TABLET BY MOUTH ONCE DAILY 90 tablet 0   fexofenadine (ALLEGRA) 180 MG tablet Take 180 mg by mouth daily as needed for allergies.     meloxicam (MOBIC) 15 MG tablet Take 1 tablet (15 mg total) by mouth daily. 90 tablet 0   methylPREDNISolone (MEDROL DOSEPAK) 4 MG TBPK tablet Take as directed on package 21 tablet 0   montelukast (SINGULAIR) 10 MG tablet TAKE 1 TABLET AT BEDTIME 90 tablet 0   Olopatadine-Mometasone (RYALTRIS) 665-25 MCG/ACT SUSP Place 1-2 sprays into the nose in the morning and at bedtime. 29 g 0   omeprazole (PRILOSEC) 20 MG capsule Take 20 mg by mouth as needed.     progesterone (PROMETRIUM) 100 MG capsule TAKE 1 CAPSULE EVERY DAY AS DIRECTED 90 capsule 0   progesterone (PROMETRIUM) 200 MG capsule Take 1 capsule by mouth at bedtime 30 capsule 4   rosuvastatin (CRESTOR) 5 MG tablet Take with supper 3 times a week 36 tablet 3   tretinoin (RETIN-A) 0.1 % cream APPLY TOPICALLY AT BEDTIME 45 g 1   zolpidem (AMBIEN) 10 MG tablet Take 1 tablet (10 mg total) by mouth at bedtime. 90 tablet 1   No current facility-administered medications for this visit.   Allergies: Allergies  Allergen Reactions   Codeine Nausea Only   I reviewed her past medical history, social history, family history, and environmental history and no significant changes have been reported from her previous visit.  Review of Systems  Constitutional:  Negative for  appetite change, chills, fever and unexpected weight change.  HENT:  Negative for congestion and rhinorrhea.   Eyes:  Negative for itching.  Respiratory:  Positive for cough, chest tightness, shortness of breath and wheezing.   Cardiovascular:  Negative for chest pain.  Gastrointestinal:  Negative for abdominal pain.  Genitourinary:  Negative for difficulty urinating.  Skin:  Negative for rash.  Allergic/Immunologic: Positive for environmental allergies.  Neurological:  Positive for headaches.     Objective: There were no vitals taken for this visit. There is no height or weight on file to calculate BMI. Physical Exam Vitals and nursing note reviewed.  Constitutional:      Appearance: Normal appearance. She is well-developed.  HENT:     Head: Normocephalic and atraumatic.     Right Ear: Tympanic membrane and external ear normal.     Left Ear: Tympanic membrane and external ear normal.     Nose: Nose normal.     Mouth/Throat:     Mouth: Mucous membranes are moist.     Pharynx: Oropharynx is clear.  Eyes:     Conjunctiva/sclera: Conjunctivae normal.  Cardiovascular:     Rate and Rhythm: Normal rate and regular rhythm.     Heart sounds: Normal heart sounds. No murmur heard.    No friction rub. No gallop.  Pulmonary:     Effort: Pulmonary effort is normal.     Breath sounds: Normal breath sounds. No wheezing, rhonchi or rales.  Musculoskeletal:     Cervical back: Neck supple.  Skin:    General: Skin is warm.     Findings: No rash.  Neurological:     Mental Status: She is alert and oriented to person, place, and time.  Psychiatric:        Behavior: Behavior normal.    Previous notes and tests were reviewed. The plan was reviewed with the patient/family, and all questions/concerned were addressed.  It was my pleasure to see Robin Chaney today and participate in her care. Please feel free to contact me with any questions or concerns.  Sincerely,  Rexene Alberts, DO Allergy & Immunology  [facility] office: [place] office: [phone]

## 2022-04-10 ENCOUNTER — Encounter: Payer: Self-pay | Admitting: Allergy

## 2022-04-10 ENCOUNTER — Other Ambulatory Visit (HOSPITAL_COMMUNITY): Payer: Self-pay

## 2022-04-10 ENCOUNTER — Ambulatory Visit: Payer: Medicare PPO | Admitting: Allergy

## 2022-04-10 VITALS — BP 142/82 | HR 74 | Temp 97.9°F | Ht 63.0 in | Wt 151.0 lb

## 2022-04-10 DIAGNOSIS — J4540 Moderate persistent asthma, uncomplicated: Secondary | ICD-10-CM | POA: Diagnosis not present

## 2022-04-10 DIAGNOSIS — J3089 Other allergic rhinitis: Secondary | ICD-10-CM

## 2022-04-10 DIAGNOSIS — B999 Unspecified infectious disease: Secondary | ICD-10-CM

## 2022-04-10 DIAGNOSIS — H1013 Acute atopic conjunctivitis, bilateral: Secondary | ICD-10-CM | POA: Diagnosis not present

## 2022-04-10 DIAGNOSIS — H1010 Acute atopic conjunctivitis, unspecified eye: Secondary | ICD-10-CM

## 2022-04-10 DIAGNOSIS — J302 Other seasonal allergic rhinitis: Secondary | ICD-10-CM | POA: Diagnosis not present

## 2022-04-10 DIAGNOSIS — R42 Dizziness and giddiness: Secondary | ICD-10-CM

## 2022-04-10 MED ORDER — SPIRIVA RESPIMAT 1.25 MCG/ACT IN AERS
2.0000 | INHALATION_SPRAY | Freq: Every day | RESPIRATORY_TRACT | 3 refills | Status: DC
  Filled 2022-04-10: qty 4, 30d supply, fill #0

## 2022-04-10 NOTE — Assessment & Plan Note (Signed)
Past history - Frequent bronchitis episodes. Up to date with flu, pneumonia and Covid-19 vaccines.  Interim history - 2023 bloodwork showed normal immunoglobulin levels, good protection against diptheria, tetanus and pneumoniae. Marland Kitchen Keep track of infections and antibiotics use. . Get CBC diff re-checked at PCP's office due WBC being lower than normal - offered to draw today but patient declined.

## 2022-04-10 NOTE — Assessment & Plan Note (Signed)
Past history - Rhino conjunctivitis symptoms for many years with flares in the spring. Used to be on AIT for 1 year with good benefit. No recent testing. 1 dog at home.  Interim history - 2023 bloodwork was to dog, grass pollen and mold.  . Use over the counter antihistamines such as Zyrtec (cetirizine), Claritin (loratadine), Allegra (fexofenadine), or Xyzal (levocetirizine) daily as needed. May take twice a day during allergy flares. May switch antihistamines every few months. . Start Ryaltris (olopatadine + mometasone nasal spray combination) 1-2 sprays per nostril twice a day.  o Let me know if this works well for you and will send in a prescription then.  . Nasal saline spray (i.e., Simply Saline) or nasal saline lavage (i.e., NeilMed) is recommended as needed and prior to medicated nasal sprays. . May use Refresh re-wetting eye drops.  . Consider allergy injections if no improvement in symptoms.

## 2022-04-10 NOTE — Assessment & Plan Note (Signed)
.   Monitor symptoms and follow up with PCP.

## 2022-04-10 NOTE — Assessment & Plan Note (Signed)
Past history - Issue with her breathing on and off but the past 3 months significantly worsened with post tussive emesis and wheezing. Had 2 steroid injections and 2 oral prednisone. 2023 CXR showed peribronchial thickening. Using albuterol once a day at least. Used Symbicort for 1 week then stopped as she was concerned if vaginal bleeding was caused by this inhaler. Covid-19 infection 1 year ago. 2023 spirometry showed: possible restrictive disease with 6% improvement in FEV1 post bronchodilator treatment. Clinically feeling improved. No coughing post treatment.  Interim history - Alvesco helped but stopped as she developed vaginal bleeding (followed by gyn). Singulair is somewhat effective. Symptoms worse at night.  Today's spirometry showed some restriction. . Daily controller medication(s): START Spiriva 1.11mg 2 puffs once a day. Sample given and demonstrated proper use. o This is a NON-steroidal inhaler. o Continue Singulair (montelukast) '10mg'$  daily at night. . May use albuterol rescue inhaler 2 puffs every 4 to 6 hours as needed for shortness of breath, chest tightness, coughing, and wheezing. May use albuterol rescue inhaler 2 puffs 5 to 15 minutes prior to strenuous physical activities. Monitor frequency of use.  . Get spirometry at next visit. . Follow up with gynecology regarding the vaginal bleeding which is not something we typically see with ICS inhaler use.

## 2022-04-10 NOTE — Patient Instructions (Addendum)
Breathing  Daily controller medication(s): START Spiriva 1.41mg 2 puffs once a day. Sample given and demonstrated proper use. This is a NON-steroidal inhaler. Continue Singulair (montelukast) '10mg'$  daily at night. May use albuterol rescue inhaler 2 puffs every 4 to 6 hours as needed for shortness of breath, chest tightness, coughing, and wheezing. May use albuterol rescue inhaler 2 puffs 5 to 15 minutes prior to strenuous physical activities. Monitor frequency of use.  Breathing control goals:  Full participation in all desired activities (may need albuterol before activity) Albuterol use two times or less a week on average (not counting use with activity) Cough interfering with sleep two times or less a month Oral steroids no more than once a year No hospitalizations   Environmental allergies 2023 bloodwork was to dog, grass pollen and mold.  Use over the counter antihistamines such as Zyrtec (cetirizine), Claritin (loratadine), Allegra (fexofenadine), or Xyzal (levocetirizine) daily as needed. May take twice a day during allergy flares. May switch antihistamines every few months. Start Ryaltris (olopatadine + mometasone nasal spray combination) 1-2 sprays per nostril twice a day.  Let me know if this works well for you and will send in a prescription then.  Nasal saline spray (i.e., Simply Saline) or nasal saline lavage (i.e., NeilMed) is recommended as needed and prior to medicated nasal sprays. May use Refresh re-wetting eye drops.  Consider allergy injections if no improvement in symptoms.   Recurrent infections Keep track of infections and antibiotics use. Get CBC diff re-checked at PCP's office.   Dizziness/elevated blood pressure Monitor symptoms and follow up with PCP.   Follow up in 3 months or sooner if needed.  Follow up with gynecology and scheduled.   Pet Allergen Avoidance: Contrary to popular opinion, there are no "hypoallergenic" breeds of dogs or cats. That is  because people are not allergic to an animal's hair, but to an allergen found in the animal's saliva, dander (dead skin flakes) or urine. Pet allergy symptoms typically occur within minutes. For some people, symptoms can build up and become most severe 8 to 12 hours after contact with the animal. People with severe allergies can experience reactions in public places if dander has been transported on the pet owners' clothing. Keeping an animal outdoors is only a partial solution, since homes with pets in the yard still have higher concentrations of animal allergens. Before getting a pet, ask your allergist to determine if you are allergic to animals. If your pet is already considered part of your family, try to minimize contact and keep the pet out of the bedroom and other rooms where you spend a great deal of time. As with dust mites, vacuum carpets often or replace carpet with a hardwood floor, tile or linoleum. High-efficiency particulate air (HEPA) cleaners can reduce allergen levels over time. While dander and saliva are the source of cat and dog allergens, urine is the source of allergens from rabbits, hamsters, mice and g[place]pigs; so ask a non-allergic family member to clean the animal's cage. If you have a pet allergy, talk to your allergist about the potential for allergy immunotherapy (allergy shots). This strategy can often provide long-term relief. Reducing Pollen Exposure Pollen seasons: trees (spring), grass (summer) and ragweed/weeds (fall). Keep windows closed in your home and car to lower pollen exposure.  Install air conditioning in the bedroom and throughout the house if possible.  Avoid going out in dry windy days - especially early morning. Pollen counts are highest between 5 - 10 AM  and on dry, hot and windy days.  Save outside activities for late afternoon or after a heavy rain, when pollen levels are lower.  Avoid mowing of grass if you have grass pollen allergy. Be aware that  pollen can also be transported indoors on people and pets.  Dry your clothes in an automatic dryer rather than hanging them outside where they might collect pollen.  Rinse hair and eyes before bedtime. Mold Control Mold and fungi can grow on a variety of surfaces provided certain temperature and moisture conditions exist.  Outdoor molds grow on plants, decaying vegetation and soil. The major outdoor mold, Alternaria and Cladosporium, are found in very high numbers during hot and dry conditions. Generally, a late summer - fall peak is seen for common outdoor fungal spores. Rain will temporarily lower outdoor mold spore count, but counts rise rapidly when the rainy period ends. The most important indoor molds are Aspergillus and Penicillium. Dark, humid and poorly ventilated basements are ideal sites for mold growth. The next most common sites of mold growth are the bathroom and the kitchen. Outdoor (Seasonal) Mold Control Use air conditioning and keep windows closed. Avoid exposure to decaying vegetation. Avoid leaf raking. Avoid grain handling. Consider wearing a face mask if working in moldy areas.  Indoor (Perennial) Mold Control  Maintain humidity below 50%. Get rid of mold growth on hard surfaces with water, detergent and, if necessary, 5% bleach (do not mix with other cleaners). Then dry the area completely. If mold covers an area more than 10 square feet, consider hiring an indoor environmental professional. For clothing, washing with soap and water is best. If moldy items cannot be cleaned and dried, throw them away. Remove sources e.g. contaminated carpets. Repair and seal leaking roofs or pipes. Using dehumidifiers in damp basements may be helpful, but empty the water and clean units regularly to prevent mildew from forming. All rooms, especially basements, bathrooms and kitchens, require ventilation and cleaning to deter mold and mildew growth. Avoid carpeting on concrete or damp floors,  and storing items in damp areas.

## 2022-04-15 ENCOUNTER — Other Ambulatory Visit: Payer: Self-pay | Admitting: Internal Medicine

## 2022-04-15 ENCOUNTER — Other Ambulatory Visit (HOSPITAL_COMMUNITY): Payer: Self-pay

## 2022-04-15 MED ORDER — ZOLPIDEM TARTRATE 10 MG PO TABS
10.0000 mg | ORAL_TABLET | Freq: Every day | ORAL | 1 refills | Status: DC
  Filled 2022-04-15: qty 90, 90d supply, fill #0

## 2022-04-18 ENCOUNTER — Other Ambulatory Visit (HOSPITAL_COMMUNITY): Payer: Self-pay

## 2022-04-19 ENCOUNTER — Other Ambulatory Visit (HOSPITAL_COMMUNITY): Payer: Self-pay

## 2022-04-19 MED ORDER — IBUPROFEN 800 MG PO TABS
ORAL_TABLET | ORAL | 0 refills | Status: DC
  Filled 2022-04-19: qty 20, 7d supply, fill #0

## 2022-04-19 MED ORDER — MISOPROSTOL 200 MCG PO TABS
ORAL_TABLET | ORAL | 0 refills | Status: DC
  Filled 2022-04-19: qty 1, 1d supply, fill #0

## 2022-04-25 DIAGNOSIS — N950 Postmenopausal bleeding: Secondary | ICD-10-CM | POA: Diagnosis not present

## 2022-04-28 NOTE — Patient Instructions (Addendum)
She is having GYN surgery in July.  She will continue with low-dose Crestor at this time.  Triglycerides remain elevated at 206 but were  352 when checked 10 months ago.  Medicare wellness visit is due in the Fall.  Continue follow-up with Allergist.  Continue Lexapro and Ambien as directed.  Trial of Mobic 15 mg daily for low back pain.  Can take prednisone taper if back pain does not subside.

## 2022-04-29 ENCOUNTER — Other Ambulatory Visit: Payer: Self-pay | Admitting: *Deleted

## 2022-04-29 ENCOUNTER — Telehealth: Payer: Self-pay | Admitting: Allergy

## 2022-04-29 MED ORDER — ALVESCO 160 MCG/ACT IN AERS
1.0000 | INHALATION_SPRAY | Freq: Two times a day (BID) | RESPIRATORY_TRACT | 5 refills | Status: DC
Start: 2022-04-29 — End: 2022-08-06
  Filled 2022-04-29: qty 6.1, 30d supply, fill #0

## 2022-04-29 NOTE — Telephone Encounter (Signed)
Called and spoke with patent and she stated that the Spiriva was ok but that last week she had the D&C and she would like to start back on the Alvesco to see how that helps. She states that she likes how the Spiriva helps but she does not like the device as a twisthaler.

## 2022-04-29 NOTE — Telephone Encounter (Signed)
Sent in Alvesco 198mg 1 puff BID.

## 2022-04-29 NOTE — Telephone Encounter (Signed)
Pt called in to request samples for Alvesco or a refill. Pt uses [facility] in fax [phone]

## 2022-04-30 ENCOUNTER — Other Ambulatory Visit (HOSPITAL_COMMUNITY): Payer: Self-pay

## 2022-05-30 ENCOUNTER — Telehealth: Payer: Self-pay | Admitting: Internal Medicine

## 2022-05-30 MED ORDER — ZOLPIDEM TARTRATE 10 MG PO TABS
10.0000 mg | ORAL_TABLET | Freq: Every day | ORAL | 1 refills | Status: DC
Start: 2022-05-30 — End: 2022-10-09

## 2022-05-30 NOTE — Telephone Encounter (Signed)
Received voice mail from  [facility], [place] Phone:  [phone]  Fax:  [phone]      Stating patient was wanting 88 Day supply Of below medication sent to them.   zolpidem (AMBIEN) 10 MG tablet

## 2022-05-30 NOTE — Telephone Encounter (Signed)
Refill Ambien 10 mg #90 with one refill  Directions:one tab by mouth qhs as needed for sleep with one refill MJB, MD

## 2022-06-20 ENCOUNTER — Other Ambulatory Visit (HOSPITAL_COMMUNITY): Payer: Self-pay

## 2022-07-08 ENCOUNTER — Encounter: Payer: Self-pay | Admitting: Internal Medicine

## 2022-07-08 ENCOUNTER — Ambulatory Visit (INDEPENDENT_AMBULATORY_CARE_PROVIDER_SITE_OTHER): Payer: Medicare PPO | Admitting: Internal Medicine

## 2022-07-08 ENCOUNTER — Telehealth: Payer: Self-pay | Admitting: Internal Medicine

## 2022-07-08 VITALS — BP 134/78 | HR 66 | Temp 98.8°F | Ht 63.0 in | Wt 154.8 lb

## 2022-07-08 DIAGNOSIS — M5416 Radiculopathy, lumbar region: Secondary | ICD-10-CM

## 2022-07-08 MED ORDER — METHYLPREDNISOLONE ACETATE 80 MG/ML IJ SUSP
80.0000 mg | Freq: Once | INTRAMUSCULAR | Status: AC
  Administered 2022-07-08: 80 mg via INTRAMUSCULAR

## 2022-07-08 NOTE — Telephone Encounter (Signed)
Robin Chaney [phone]  [name] called to say she is having back pain, that is running down into her legs and she can hardly walk. She would like to come in today and see you.

## 2022-07-08 NOTE — Patient Instructions (Addendum)
Depo Medrol 80 mg IM given today at patient request.  She does not want narcotic pain medication.

## 2022-07-08 NOTE — Progress Notes (Signed)
   Subjective:    Patient ID: Robin Chaney, female    DOB: [birth year], 71 y.o.   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  She was seen here in 2018 with right-sided sciatica.  At that time she was still working a few days a week but was pushing and pulling and lifting a lot with her job duties.  Was treated with Sterapred DS 10 mg 6-day Dosepak with 1 refill and improved.  Her general health is good.  She has a history of allergic rhinitis.  Longstanding history of insomnia for which she takes Ambien.  She also had an episode in January 2017 with right lower back pain radiating into the right leg.  She was treated with a course of prednisone at that time and improved.  Now that she is retired, she is working at [employer].  She is physically active and tries to stay healthy.  Does not do weight lifting.  Does not recall any recent heavy lifting or strenuous activity.  She is never been to physical therapy and we have discussed this today but she is a bit reluctant.  She points to her right buttock as the source of pain at this time.  Review of Systems she is anxious about this and is a bit tearful.  Seems to be in a fair amount of pain.     Objective:   Physical Exam  Blood pressure 134/78, pulse 66, temperature 98.8 degrees, pulse oximetry 96% ,weight 154 pounds 12.8 ounces, BMI 27.42 Since patient is intact.  Her muscle strength is 5/5 in both lower extremities.  She moves a bit slowly and walking.     Assessment & Plan:   Right sciatica  History of recurrent episodes of right sciatica  Has never had lumbar MRI  Plan: She would like an injection of Depo-Medrol 80 mg IM today and this was provided.  She is not very interested in going to physical therapy.  She would like to get an MRI.  It is unlikely we as a primary care provider can get this approved with  Medicare without having her go through physical therapy first.  She has worked with and known Dr. Ellene Route for a number of years and would like to see if he would be willing to see her.

## 2022-07-08 NOTE — Telephone Encounter (Signed)
scheduled

## 2022-07-09 DIAGNOSIS — H43811 Vitreous degeneration, right eye: Secondary | ICD-10-CM | POA: Diagnosis not present

## 2022-07-16 ENCOUNTER — Other Ambulatory Visit: Payer: Medicare PPO

## 2022-07-19 ENCOUNTER — Other Ambulatory Visit: Payer: Medicare PPO

## 2022-07-22 ENCOUNTER — Other Ambulatory Visit: Payer: Medicare PPO

## 2022-07-22 DIAGNOSIS — E782 Mixed hyperlipidemia: Secondary | ICD-10-CM | POA: Diagnosis not present

## 2022-07-22 DIAGNOSIS — F419 Anxiety disorder, unspecified: Secondary | ICD-10-CM

## 2022-07-22 DIAGNOSIS — F32A Depression, unspecified: Secondary | ICD-10-CM | POA: Diagnosis not present

## 2022-07-22 DIAGNOSIS — R5383 Other fatigue: Secondary | ICD-10-CM

## 2022-07-23 ENCOUNTER — Encounter: Payer: Self-pay | Admitting: Internal Medicine

## 2022-07-23 ENCOUNTER — Ambulatory Visit (INDEPENDENT_AMBULATORY_CARE_PROVIDER_SITE_OTHER): Payer: Medicare PPO | Admitting: Internal Medicine

## 2022-07-23 VITALS — BP 134/76 | HR 66 | Temp 97.6°F | Ht 64.0 in | Wt 157.4 lb

## 2022-07-23 DIAGNOSIS — F411 Generalized anxiety disorder: Secondary | ICD-10-CM

## 2022-07-23 DIAGNOSIS — Z0000 Encounter for general adult medical examination without abnormal findings: Secondary | ICD-10-CM

## 2022-07-23 DIAGNOSIS — E782 Mixed hyperlipidemia: Secondary | ICD-10-CM

## 2022-07-23 DIAGNOSIS — G4700 Insomnia, unspecified: Secondary | ICD-10-CM | POA: Diagnosis not present

## 2022-07-23 DIAGNOSIS — Z8709 Personal history of other diseases of the respiratory system: Secondary | ICD-10-CM

## 2022-07-23 DIAGNOSIS — E781 Pure hyperglyceridemia: Secondary | ICD-10-CM

## 2022-07-23 DIAGNOSIS — F32A Depression, unspecified: Secondary | ICD-10-CM

## 2022-07-23 DIAGNOSIS — K219 Gastro-esophageal reflux disease without esophagitis: Secondary | ICD-10-CM

## 2022-07-23 DIAGNOSIS — Z23 Encounter for immunization: Secondary | ICD-10-CM | POA: Diagnosis not present

## 2022-07-23 DIAGNOSIS — M5416 Radiculopathy, lumbar region: Secondary | ICD-10-CM | POA: Diagnosis not present

## 2022-07-23 DIAGNOSIS — F419 Anxiety disorder, unspecified: Secondary | ICD-10-CM | POA: Diagnosis not present

## 2022-07-23 DIAGNOSIS — Z6826 Body mass index (BMI) 26.0-26.9, adult: Secondary | ICD-10-CM | POA: Diagnosis not present

## 2022-07-23 DIAGNOSIS — M5441 Lumbago with sciatica, right side: Secondary | ICD-10-CM | POA: Diagnosis not present

## 2022-07-23 LAB — CBC WITH DIFFERENTIAL/PLATELET
Absolute Monocytes: 438 cells/uL (ref 200–950)
Basophils Absolute: 60 cells/uL (ref 0–200)
Basophils Relative: 2 %
Eosinophils Absolute: 150 cells/uL (ref 15–500)
Eosinophils Relative: 5 %
HCT: 40.1 % (ref 35.0–45.0)
Hemoglobin: 13.6 g/dL (ref 11.7–15.5)
Lymphs Abs: 1305 cells/uL (ref 850–3900)
MCH: 31.4 pg (ref 27.0–33.0)
MCHC: 33.9 g/dL (ref 32.0–36.0)
MCV: 92.6 fL (ref 80.0–100.0)
MPV: 9.8 fL (ref 7.5–12.5)
Monocytes Relative: 14.6 %
Neutro Abs: 1047 cells/uL — ABNORMAL LOW (ref 1500–7800)
Neutrophils Relative %: 34.9 %
Platelets: 292 10*3/uL (ref 140–400)
RBC: 4.33 10*6/uL (ref 3.80–5.10)
RDW: 12.2 % (ref 11.0–15.0)
Total Lymphocyte: 43.5 %
WBC: 3 10*3/uL — ABNORMAL LOW (ref 3.8–10.8)

## 2022-07-23 LAB — COMPLETE METABOLIC PANEL WITH GFR
AG Ratio: 1.8 (calc) (ref 1.0–2.5)
ALT: 12 U/L (ref 6–29)
AST: 16 U/L (ref 10–35)
Albumin: 4.2 g/dL (ref 3.6–5.1)
Alkaline phosphatase (APISO): 65 U/L (ref 37–153)
BUN/Creatinine Ratio: 31 (calc) — ABNORMAL HIGH (ref 6–22)
BUN: 16 mg/dL (ref 7–25)
CO2: 24 mmol/L (ref 20–32)
Calcium: 8.7 mg/dL (ref 8.6–10.4)
Chloride: 106 mmol/L (ref 98–110)
Creat: 0.51 mg/dL — ABNORMAL LOW (ref 0.60–1.00)
Globulin: 2.4 g/dL (calc) (ref 1.9–3.7)
Glucose, Bld: 85 mg/dL (ref 65–99)
Potassium: 4.6 mmol/L (ref 3.5–5.3)
Sodium: 139 mmol/L (ref 135–146)
Total Bilirubin: 0.4 mg/dL (ref 0.2–1.2)
Total Protein: 6.6 g/dL (ref 6.1–8.1)
eGFR: 100 mL/min/{1.73_m2} (ref >=60)

## 2022-07-23 LAB — LIPID PANEL
Cholesterol: 172 mg/dL (ref <200)
HDL: 52 mg/dL (ref >=50)
LDL Cholesterol (Calc): 78 mg/dL (calc)
Non-HDL Cholesterol (Calc): 120 mg/dL (calc) (ref <130)
Total CHOL/HDL Ratio: 3.3 (calc) (ref <5.0)
Triglycerides: 349 mg/dL — ABNORMAL HIGH (ref <150)

## 2022-07-23 LAB — POCT URINALYSIS DIPSTICK
Bilirubin, UA: NEGATIVE
Blood, UA: NEGATIVE
Glucose, UA: NEGATIVE
Ketones, UA: NEGATIVE
Leukocytes, UA: NEGATIVE
Nitrite, UA: NEGATIVE
Protein, UA: NEGATIVE
Spec Grav, UA: 1.015 (ref 1.010–1.025)
Urobilinogen, UA: 0.2 E.U./dL
pH, UA: 6 (ref 5.0–8.0)

## 2022-07-23 LAB — TSH: TSH: 2.7 mIU/L (ref 0.40–4.50)

## 2022-07-23 MED ORDER — ROSUVASTATIN CALCIUM 5 MG PO TABS: 5.0000 mg | ORAL_TABLET | Freq: Every day | ORAL | 3 refills | Status: DC

## 2022-07-23 NOTE — Progress Notes (Signed)
Annual Wellness Visit     Patient: Robin Chaney, Female    DOB: 1950/11/14, 71 y.o.   MRN: [id number] Visit Date: 07/23/2022   Subjective    Robin Chaney is a 71 y.o. Female who presents today for her Annual Wellness Visit.  She also presents for health maintenance exam and evaluation of medical issues.  She is having issues with right lumbar radiculopathy.  She is seen seen today with with right sciatica and given Depo-Medrol 80 mg IM at her request.  She would like to get an MRI.  She would like to see Dr. Ellene Route.  She was seen here in 2018 with right-sided sciatica.  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  Was treated with a steroid 10 mg 6-day Dosepak and improved.    She is retired.  Previously worked as an Therapist, sports at [employer].  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  She travels.  She did not do weight lifting or strenuous activity.  Has tried a little bit of physical therapy but this has not helped her back pain.  She points to her right buttock as source of her pain.  She had an episode in January 2017 with right lower back pain radiating into right leg.  This was treated with a course of prednisone and improved.  History of anxiety depression treated with Lexapro.  Is on estrogen replacement.  Longstanding history of insomnia treated with Ambien.  History of allergic rhinitis treated with Allegra.  Has Ventolin inhaler on hand as well as Symbicort inhaler.  History of bilateral breast implants.  History of GE reflux treated with omeprazole.  Had colonoscopy in 2018.  5-year follow-up was recommended.  She needs to call [facility].  Mammogram done through GYN office.  Triglycerides are markedly elevated at 349 and were 206 when checked 4 months ago.  This may be due to steroid therapy and activity.  Have advised her to take Crestor 5 mg daily.  Watch alcohol  intake.     Review of Systems see above   Objective    Vitals: Blood pressure 134/76 pulse 66 temperature 97.6 degrees pulse oximetry 98% weight 157 pounds 6.4 ounces BMI 27.02  Physical Exam skin: Warm and dry.  Nodes none.  TMs clear.  Pharynx clear.  Neck supple.  No JVD thyromegaly or carotid bruits.  Chest is clear to auscultation without rales or wheezing.  Cardiac exam: Regular rate and rhythm.  Bilateral breast implants noted.  Abdomen soft nondistended without hepatosplenomegaly masses or tenderness.  No lower extremity pitting edema.  Straight leg raising is positive on the right.  Negative on the left.  Her muscle strength is normal in her legs and sensation is intact.  Brief neurological exam shows no focal deficits.  Affect thought and judgment are normal.    Most recent fall risk assessment:    07/08/2022    2:18 PM  Fall Risk   Falls in the past year? 0  Number falls in past yr: 0  Injury with Fall? 0  Risk for fall due to : No Fall Risks  Follow up Falls evaluation completed    Most recent depression screenings:    07/08/2022    2:18 PM 10/26/2021   11:00 AM  PHQ 2/9 Scores  PHQ - 2 Score 0 0  Most recent cognitive screening:     No data to display             Assessment & Plan  Lumbar radiculopathy-persistent-ask for consultation with Dr. Ellene Route  Hyperlipidemia treated with low-dose Crestor 5 mg daily  History of allergic rhinitis and reactive airways disease treated with Ventolin inhaler and Singulair  Longstanding history of insomnia treated with Ambien  Estrogen therapy per GYN  Mild hyperlipidemia treated with Crestor 5 mg daily  Musculoskeletal pain treated with meloxicam  Anxiety depression treated with Lexapro  Plan: We will contact Dr. Clarice Pole office about an appointment.  Flu vaccine given.  Other vaccines discussed..         Annual wellness visit done today including the all of the following: Reviewed patient's Family  Medical History Reviewed and updated list of patient's medical providers Assessment of cognitive impairment was done Assessed patient's functional ability Established a written schedule for health screening [place] Completed and Reviewed  Discussed health benefits of physical activity, and encouraged her to engage in regular exercise appropriate for her age and condition.         IElby Showers, MD, have reviewed all documentation for this visit. The documentation on 08/06/22 for the exam, diagnosis, procedures, and orders are all accurate and complete.   LaVon Barron Alvine, CMA

## 2022-08-01 DIAGNOSIS — M5136 Other intervertebral disc degeneration, lumbar region: Secondary | ICD-10-CM | POA: Diagnosis not present

## 2022-08-01 DIAGNOSIS — M545 Low back pain, unspecified: Secondary | ICD-10-CM | POA: Diagnosis not present

## 2022-08-01 DIAGNOSIS — M5441 Lumbago with sciatica, right side: Secondary | ICD-10-CM | POA: Diagnosis not present

## 2022-08-06 NOTE — Patient Instructions (Addendum)
We are sorry you are still having issues with lumbar radiculopathy.  Depo-Medrol 80 mg IM given today at your request.  We will ask for consultation with Dr. Ellene Route.  May continue with meloxicam.  Continue Crestor for hyperlipidemia.  Continue Ambien for longstanding history of insomnia.  Flu vaccine given today.

## 2022-08-09 ENCOUNTER — Other Ambulatory Visit (HOSPITAL_COMMUNITY): Payer: Self-pay

## 2022-08-09 DIAGNOSIS — M5442 Lumbago with sciatica, left side: Secondary | ICD-10-CM | POA: Diagnosis not present

## 2022-08-09 DIAGNOSIS — Z6826 Body mass index (BMI) 26.0-26.9, adult: Secondary | ICD-10-CM | POA: Diagnosis not present

## 2022-08-09 DIAGNOSIS — M4186 Other forms of scoliosis, lumbar region: Secondary | ICD-10-CM | POA: Diagnosis not present

## 2022-08-09 MED ORDER — METHYLPREDNISOLONE 4 MG PO TBPK
ORAL_TABLET | ORAL | 0 refills | Status: DC
  Filled 2022-08-09: qty 21, 6d supply, fill #0

## 2022-08-14 IMAGING — CT CT CARDIAC CORONARY ARTERY CALCIUM SCORE
1 of 6 series · 2 of 20 positions shown, 3 images · non-contrast
Comparison: None.
COMPARISON: None.

Addendum:
EXAM:
OVER-READ INTERPRETATION  CT CHEST

The following report is an over-read performed by radiologist Dr.
over-read does not include interpretation of cardiac or coronary
anatomy or pathology. The coronary calcium score interpretation by
the cardiologist is attached.
CLINICAL DATA: Cardiovascular Disease Risk stratification
Coronary Calcium Score
TECHNIQUE: A gated, non-contrast computed tomography scan of the heart was
performed using 3mm slice thickness. Axial images were analyzed on a
dedicated workstation. Calcium scoring of the coronary arteries was
performed using the Agatston method.

[Series 3: 2 soft full fov · axial · 0.74mm/px · z∈[-187,-148]mm · 2 of 39 slices shown, 3 images]
[im 13/39  vessel]
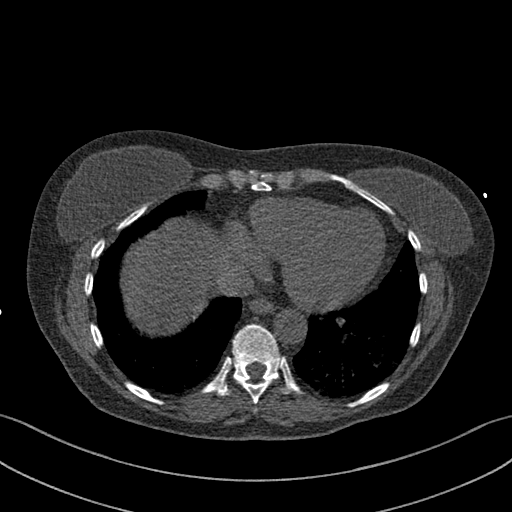
[im 13/39  lung]
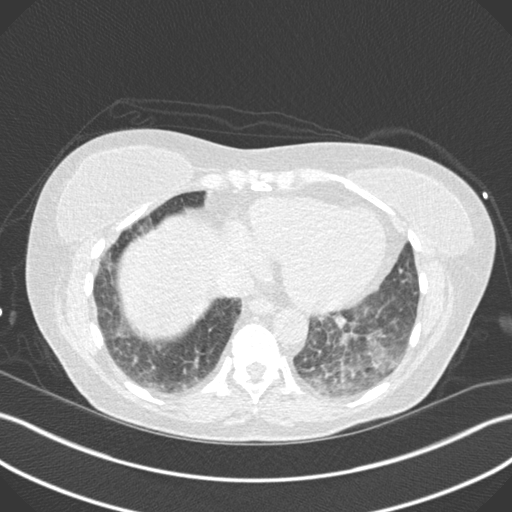
[im 26/39  vessel]
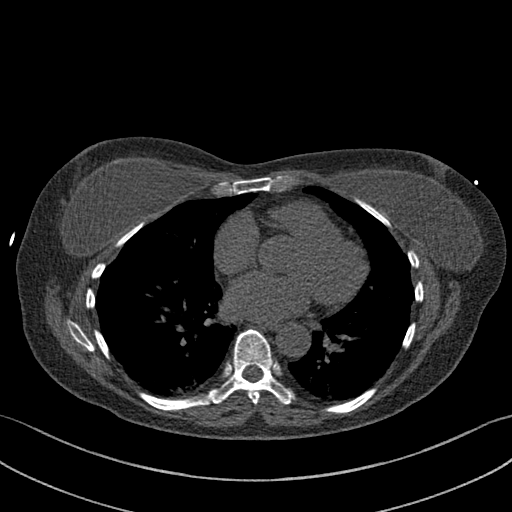

[2 of 20 positions shown; findings below may reference images not displayed]

FINDINGS: Vascular: No significant noncardiac vascular findings.

Mediastinum/Nodes: Visualized mediastinum and hilar regions
demonstrate no lymphadenopathy or masses.

Lungs/Pleura: Mild pulmonary scarring bilaterally. Visualized lungs
show no evidence of pulmonary edema, consolidation, pneumothorax,
nodule or pleural fluid.

Upper Abdomen: No acute abnormality.

Musculoskeletal: No chest wall mass or suspicious bone lesions
identified.
IMPRESSION: No significant incidental findings.
FINDINGS: Coronary arteries: Normal origins.

Coronary Calcium Score:

Left main: 0

Left anterior descending artery: 0

Left circumflex artery: 0

Right coronary artery: 0

Total: 0

Pericardium: Normal.

Ascending Aorta: Normal caliber.

Non-cardiac: See separate report from [REDACTED].
IMPRESSION: Coronary calcium score of 0 Agatston units. This suggests low risk
for future cardiac events.



If CAC=0, it is reasonable to withhold statin therapy and reassess
in 5 to 10 years, as long as higher risk conditions are absent
(diabetes mellitus, family history of premature CHD in first degree
relatives (males <55 years; females <65 years), cigarette smoking,
or LDL >=190 mg/dL).

If CAC is 1 to 99, it is reasonable to initiate statin therapy for
patients >=55 years of age.

If CAC is >=100 or >=75th percentile, it is reasonable to initiate
statin therapy at any age.

Cardiology referral should be considered for patients with CAC
scores >=400 or >=75th percentile.

*5479 AHA/ACC/AACVPR/AAPA/ABC/TROPY/NEMATYTAS/BALOGUN/Oxendine/HARSHMAN/DAS/SONGORE
Guideline on the Management of Blood Cholesterol: A Report of the
American College of Cardiology/American Heart Association Task Force
on Clinical Practice Guidelines. J Am Coll Cardiol.
0439;73(24):4679-4777.

*** End of Addendum ***
EXAM:
OVER-READ INTERPRETATION  CT CHEST

The following report is an over-read performed by radiologist Dr.
over-read does not include interpretation of cardiac or coronary
anatomy or pathology. The coronary calcium score interpretation by
the cardiologist is attached.
FINDINGS: Vascular: No significant noncardiac vascular findings.

Mediastinum/Nodes: Visualized mediastinum and hilar regions
demonstrate no lymphadenopathy or masses.

Lungs/Pleura: Mild pulmonary scarring bilaterally. Visualized lungs
show no evidence of pulmonary edema, consolidation, pneumothorax,
nodule or pleural fluid.

Upper Abdomen: No acute abnormality.

Musculoskeletal: No chest wall mass or suspicious bone lesions
identified.
IMPRESSION: No significant incidental findings.

## 2022-08-15 DIAGNOSIS — M5416 Radiculopathy, lumbar region: Secondary | ICD-10-CM | POA: Diagnosis not present

## 2022-08-15 DIAGNOSIS — M5116 Intervertebral disc disorders with radiculopathy, lumbar region: Secondary | ICD-10-CM | POA: Diagnosis not present

## 2022-08-19 ENCOUNTER — Other Ambulatory Visit: Payer: Self-pay | Admitting: Allergy

## 2022-08-20 IMAGING — US US BREAST*R* LIMITED INC AXILLA
1 series · 9 of 9 positions shown · non-contrast
Comparison: Previous exams including RIGHT breast ultrasound dated
05/04/2021.

CLINICAL DATA: Follow-up for probably benign masses in the RIGHT
breast.

EXAM:
DIGITAL DIAGNOSTIC BILATERAL MAMMOGRAM WITH IMPLANTS, CAD AND
TOMOSYNTHESIS; ULTRASOUND RIGHT BREAST LIMITED
TECHNIQUE: Bilateral digital diagnostic mammography and breast tomosynthesis
was performed. The images were evaluated with computer-aided
detection. Standard and/or implant displaced views were performed.;
Targeted ultrasound examination of the right breast was performed

[Series 1: us breast*right* limited inc axilla · 0.05mm/px · 9 of 9 slices shown]
[im 1/9]
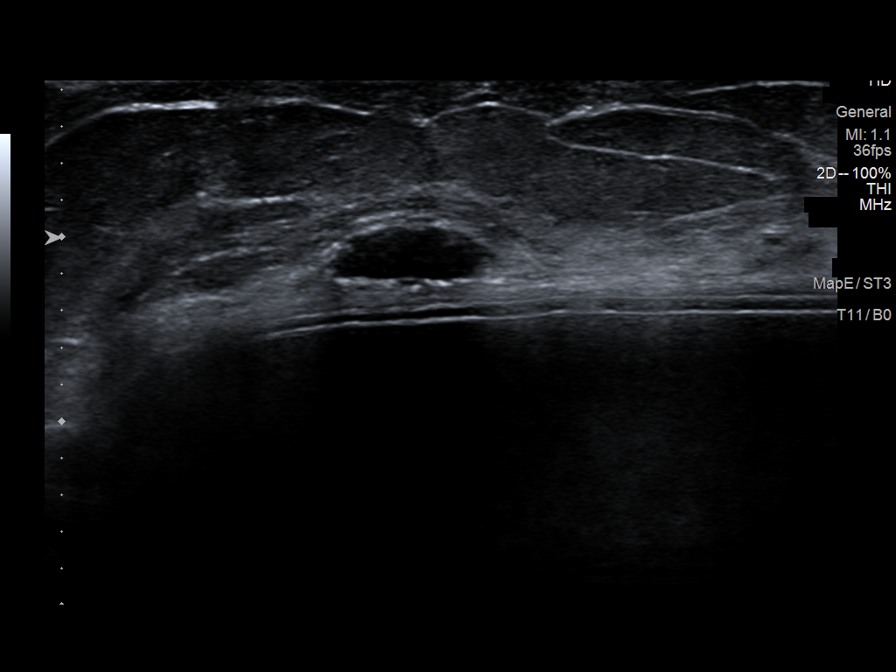
[im 2/9]
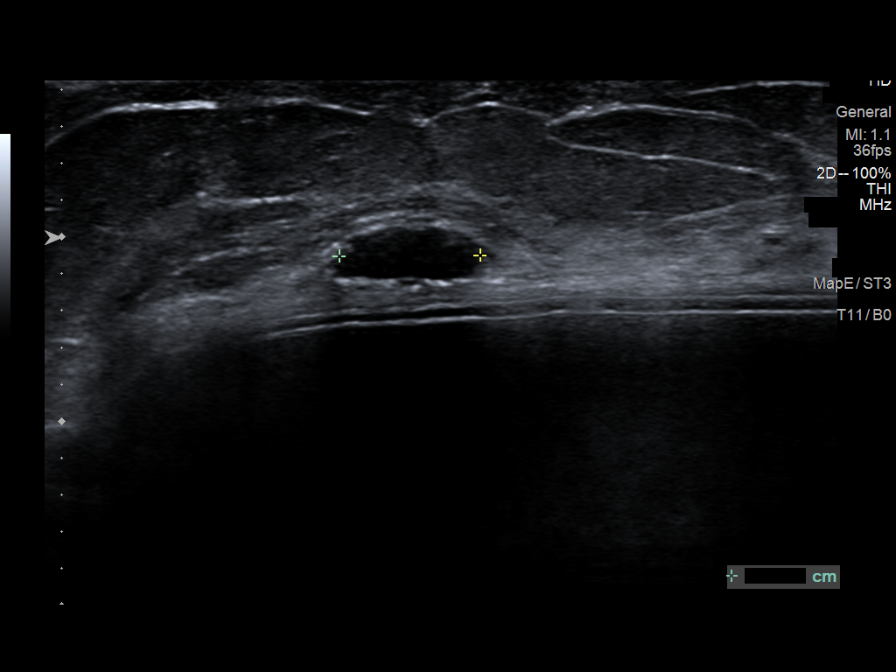
[im 3/9]
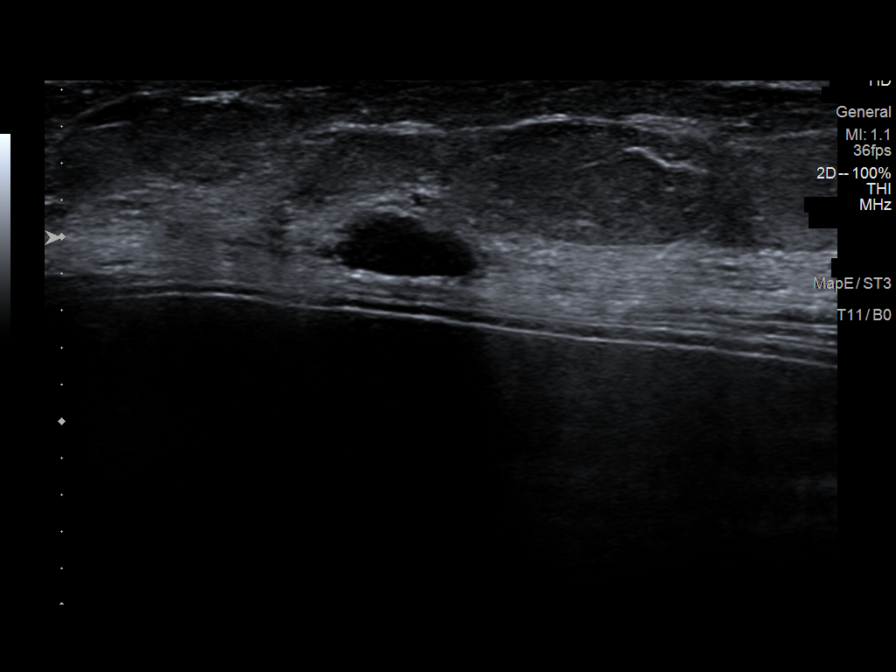
[im 4/9]
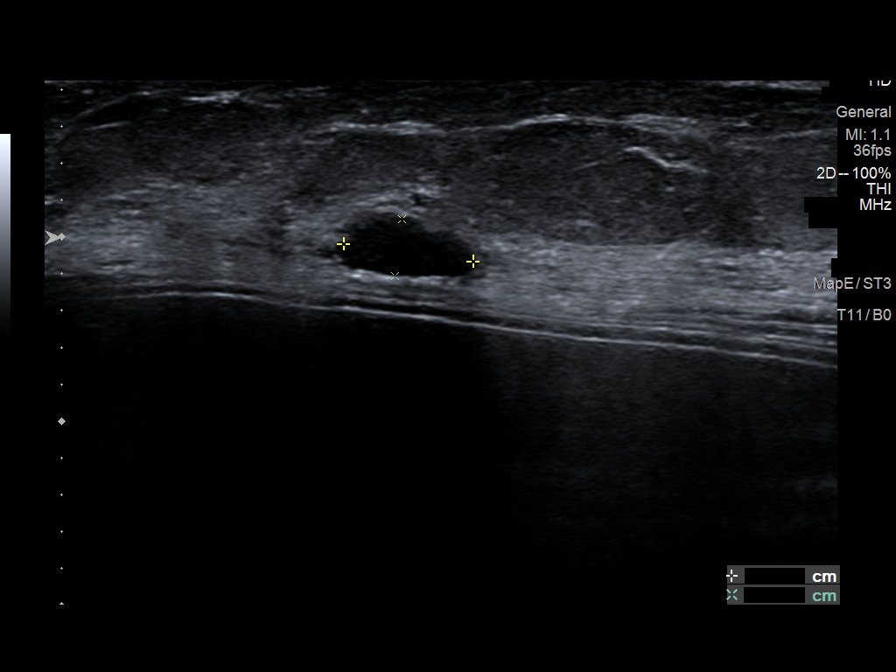
[im 5/9]
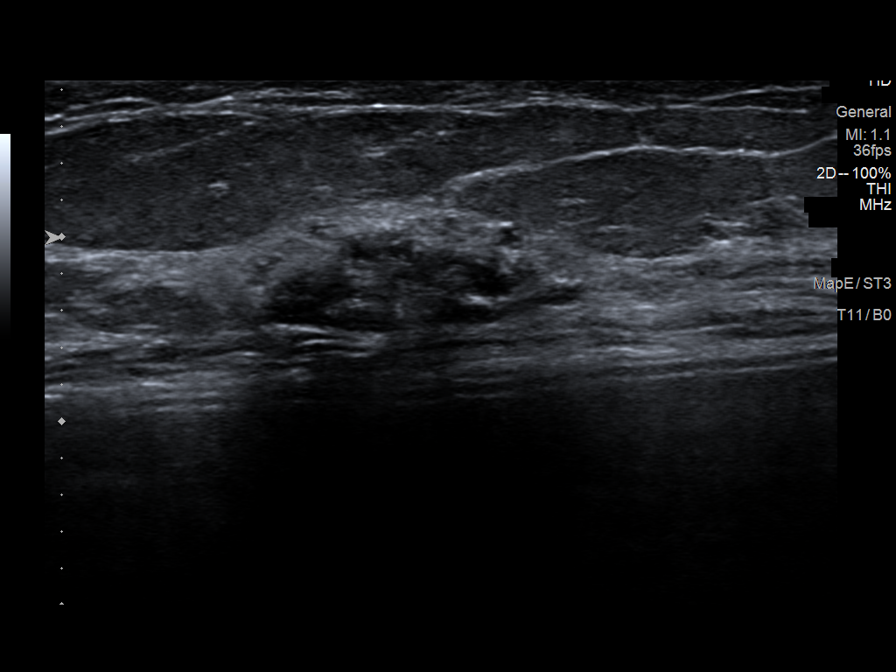
[im 6/9]
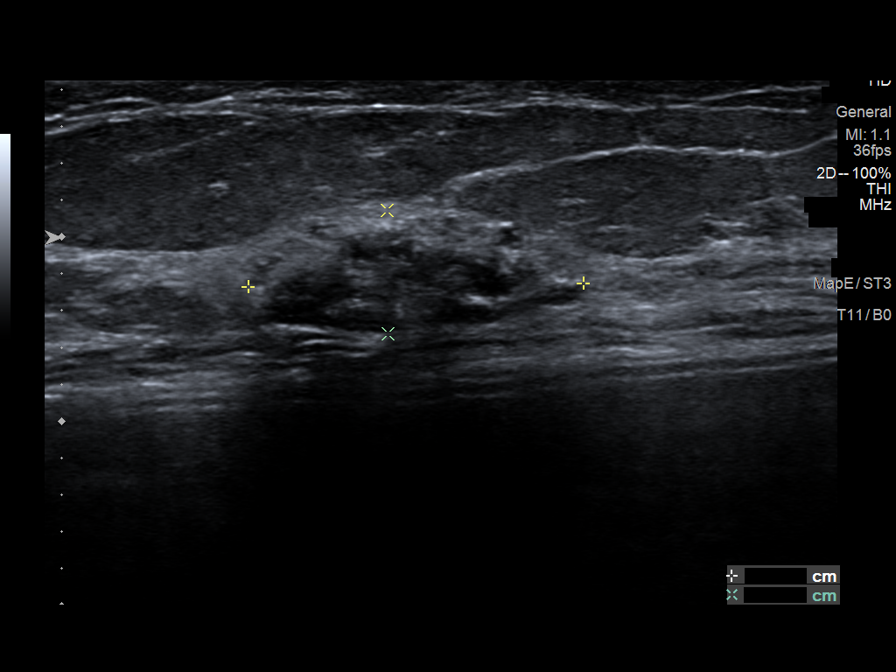
[im 7/9]
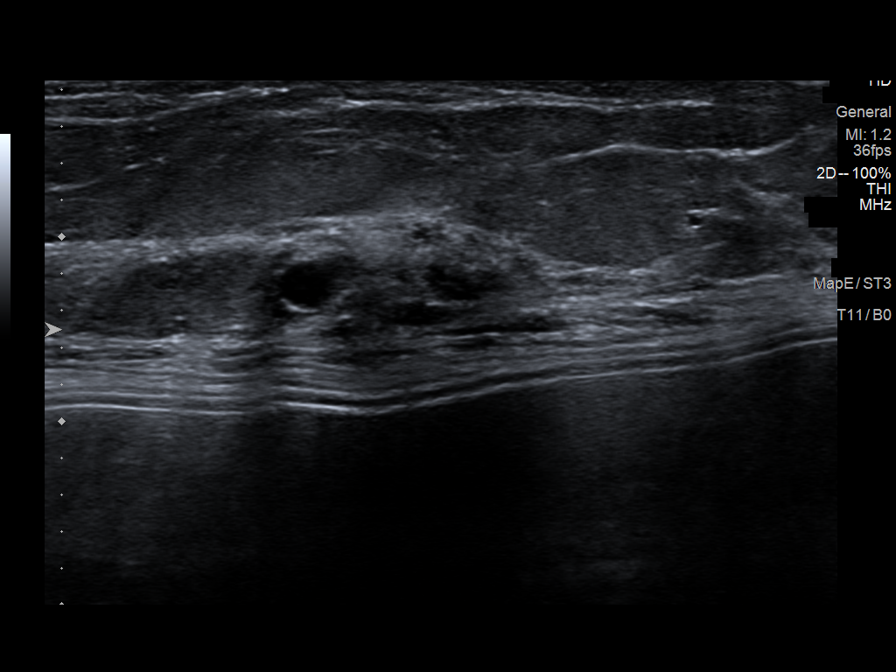
[im 8/9]
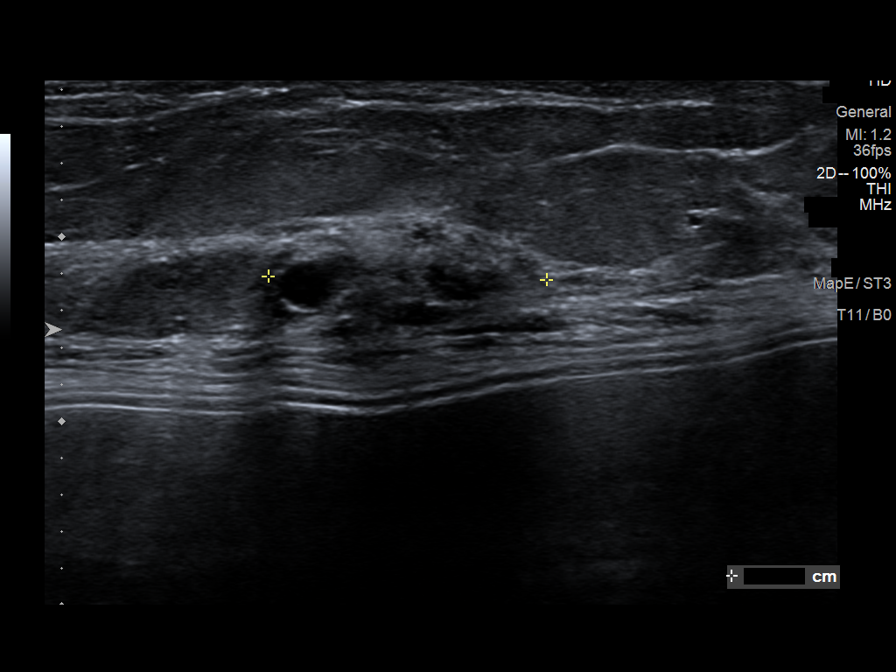
[im 9/9]
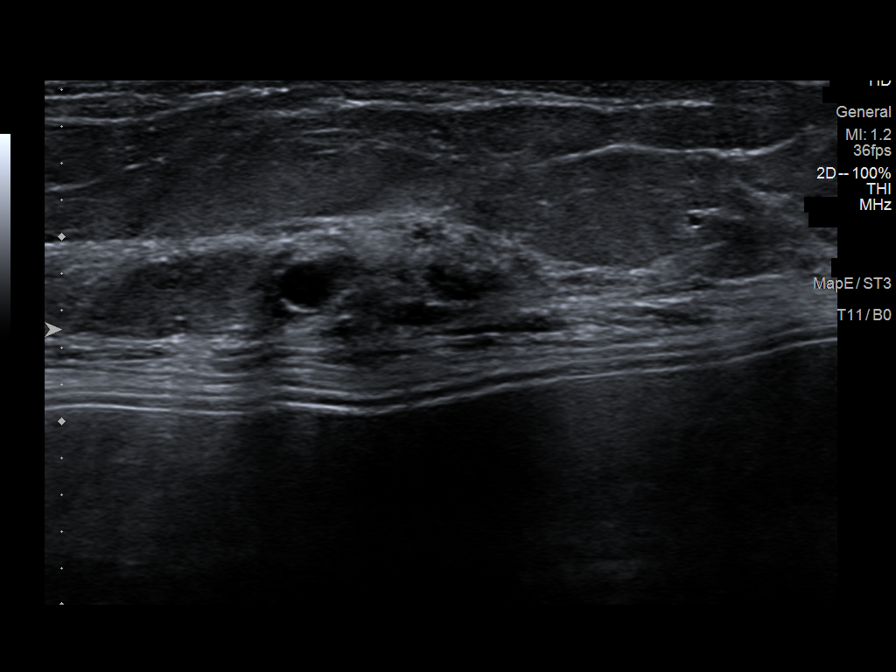

[9 of 9 positions shown; findings below may reference images not displayed]

ACR Breast Density Category b: There are scattered areas of
fibroglandular density.
FINDINGS: Previously described asymmetry within the central RIGHT breast, at
posterior depth, is stable. The previously described mass within the
upper RIGHT breast/lower axilla is slightly less conspicuous. There
are no new dominant masses, suspicious calcifications or secondary
signs of malignancy within either breast. Patient has retropectoral
implants.

Targeted ultrasound is performed, again showing an oval
circumscribed nearly anechoic mass in the RIGHT breast at the 10
o'clock axis, 5 cm from the nipple, measuring 8 x 3 x 7 mm, not
significantly changed compared to the previous exams.

Also again demonstrated is the probably benign focal fibrocystic
change within the RIGHT breast at the 1 o'clock axis, 5 cm from
nipple, measuring 1.8 x 0.7 x 1.5 cm, also not significantly changed
compared to the previous exam.

No suspicious solid or cystic mass is identified by ultrasound
today.
IMPRESSION: 1. Probably benign findings within the RIGHT breast, as detailed
above. Recommend additional follow-up diagnostic mammogram and
ultrasound in 12 months to ensure 2 year stability.
2. No evidence of malignancy within the LEFT breast.

RECOMMENDATION:
Bilateral diagnostic mammogram, and RIGHT breast ultrasound, in 12
months.

I have discussed the findings and recommendations with the patient.
If applicable, a reminder letter will be sent to the patient
regarding the next appointment.

BI-RADS CATEGORY  3: Probably benign.

## 2022-09-05 ENCOUNTER — Other Ambulatory Visit (HOSPITAL_COMMUNITY): Payer: Self-pay

## 2022-09-05 MED ORDER — VALACYCLOVIR HCL 500 MG PO TABS
500.0000 mg | ORAL_TABLET | Freq: Two times a day (BID) | ORAL | 3 refills | Status: DC
  Filled 2022-09-05: qty 10, 5d supply, fill #0

## 2022-09-11 DIAGNOSIS — M5441 Lumbago with sciatica, right side: Secondary | ICD-10-CM | POA: Diagnosis not present

## 2022-09-11 DIAGNOSIS — Z6826 Body mass index (BMI) 26.0-26.9, adult: Secondary | ICD-10-CM | POA: Diagnosis not present

## 2022-09-26 ENCOUNTER — Telehealth: Payer: Self-pay | Admitting: Internal Medicine

## 2022-09-26 ENCOUNTER — Ambulatory Visit (INDEPENDENT_AMBULATORY_CARE_PROVIDER_SITE_OTHER): Payer: Medicare PPO | Admitting: Internal Medicine

## 2022-09-26 ENCOUNTER — Other Ambulatory Visit (HOSPITAL_COMMUNITY): Payer: Self-pay

## 2022-09-26 VITALS — HR 83 | Temp 97.6°F

## 2022-09-26 DIAGNOSIS — J22 Unspecified acute lower respiratory infection: Secondary | ICD-10-CM

## 2022-09-26 DIAGNOSIS — Z8709 Personal history of other diseases of the respiratory system: Secondary | ICD-10-CM

## 2022-09-26 MED ORDER — LEVOFLOXACIN 500 MG PO TABS
500.0000 mg | ORAL_TABLET | Freq: Every day | ORAL | 0 refills | Status: DC
  Filled 2022-09-26: qty 10, 10d supply, fill #0

## 2022-09-26 MED ORDER — HYDROCODONE BIT-HOMATROP MBR 5-1.5 MG/5ML PO SOLN
5.0000 mL | Freq: Three times a day (TID) | ORAL | 0 refills | Status: DC | PRN
  Filled 2022-09-26: qty 120, 8d supply, fill #0

## 2022-09-26 MED ORDER — METHYLPREDNISOLONE 4 MG PO TBPK
ORAL_TABLET | ORAL | 0 refills | Status: DC
  Filled 2022-09-26: qty 21, 6d supply, fill #0

## 2022-09-26 NOTE — Telephone Encounter (Signed)
Robin Chaney [phone]  [name] called to say she has been sick since maybe Saturday with Cough, Green Mucus, wheezing, Body Aches, Head Congestion, COVID negative today. I scheduled her for a Car Visit

## 2022-09-26 NOTE — Progress Notes (Signed)
   Subjective:    Patient ID: Robin Chaney, female    DOB: [birth year], 71 y.o.   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  She had negative COVID test.  She does work part-time at [employer].  No recent travel.  She says she has a lot of malaise and fatigue.    Review of Systems no nausea or vomiting     Objective:   Physical Exam Temperature is 97.6 degrees, pulse oximetry 96% on room air, pulse 83 and regular Pharynx is slightly injected.  TMs are clear.  Neck supple.  Chest clear to auscultation without rales.  No wheezing.  She has congested cough.      Assessment & Plan:   Acute lower respiratory infection  Plan: Continue to use albuterol and Symbicort inhalers as prescribed.  May take Tessalon Perles 100 mg twice daily as needed for cough.  Hycodan 1 teaspoon every 8 hours as needed for cough.  Levaquin 500 milligrams daily for 10 days.  Rest and stay well-hydrated.  Call if not improving in 48 hours or sooner if worse.  Monitor pulse oximetry.

## 2022-10-09 ENCOUNTER — Telehealth: Payer: Self-pay | Admitting: Internal Medicine

## 2022-10-09 ENCOUNTER — Other Ambulatory Visit (HOSPITAL_COMMUNITY): Payer: Self-pay

## 2022-10-09 MED ORDER — MONTELUKAST SODIUM 10 MG PO TABS
10.0000 mg | ORAL_TABLET | Freq: Every day | ORAL | 1 refills | Status: DC
  Filled 2022-10-09: qty 90, 90d supply, fill #0

## 2022-10-09 MED ORDER — ESCITALOPRAM OXALATE 20 MG PO TABS
20.0000 mg | ORAL_TABLET | Freq: Every day | ORAL | 3 refills | Status: DC
  Filled 2022-10-09: qty 90, 90d supply, fill #0

## 2022-10-09 MED ORDER — ROSUVASTATIN CALCIUM 5 MG PO TABS
5.0000 mg | ORAL_TABLET | Freq: Every day | ORAL | 3 refills | Status: DC
  Filled 2022-10-09: qty 90, 90d supply, fill #0

## 2022-10-09 NOTE — Telephone Encounter (Signed)
Robin Chaney called to say to be on the look out for a fax concerning new insurance for her prescriptions, should be coming from [facility]

## 2022-10-10 ENCOUNTER — Other Ambulatory Visit (HOSPITAL_COMMUNITY): Payer: Self-pay

## 2022-10-10 MED ORDER — ZOLPIDEM TARTRATE 10 MG PO TABS
10.0000 mg | ORAL_TABLET | Freq: Every day | ORAL | 1 refills | Status: DC
  Filled 2022-10-10: qty 90, 90d supply, fill #0

## 2022-10-10 NOTE — Progress Notes (Signed)
Follow Up Note  RE: Robin Chaney MRN: [id number] DOB: 03-10-1951 Date of Office Visit: 10/11/2022  Referring provider: Elby Showers, MD Primary care provider: Elby Showers, MD  Chief Complaint: Wheezing, Asthma (Pt c/o asthma flare shortness of breath x 1 month), and Cough  History of Present Illness: I had the pleasure of seeing Robin Chaney for a follow up visit at the [facility] on 10/11/2022. She is a 72 y.o. female, who is being followed for asthma, allergic rhinoconjunctivitis, recurrent infections. Her previous allergy office visit was on 04/10/2022 with Dr. Maudie Mercury. Today is a regular follow up visit.  Asthma  In December patient had bronchitis which flared her asthma.  She took antibiotics and prednisone with some benefit.   Still having a dry cough, shortness of breath and her muscles are sore from coughing.   Not using any maintenance inhalers. She only used the Spiriva sample.  Using albuterol 3 times per day with some benefit.   Taking Singulair '10mg'$  daily.   Patient had no more vaginal bleeding and she had D&Cs and now the vaginal bleeding has resolved. She also stopped her estrogen.  Seasonal and perennial allergic rhinoconjunctivitis Using Singulair, allegra daily and Ryaltris prn.   Recurrent infections Only had 1 course of antibiotics since the last visit.    Assessment and Plan: Robin Chaney is a 72 y.o. female with: Not well controlled moderate persistent asthma Past history - Issue with her breathing on and off but the past 3 months significantly worsened with post tussive emesis and wheezing. Had 2 steroid injections and 2 oral prednisone. 2023 CXR showed peribronchial thickening. Using albuterol once a day at least. Used Symbicort for 1 week then stopped as she was concerned if vaginal bleeding was caused by this inhaler. Covid-19 infection 1 year ago. 2023 spirometry showed: possible restrictive disease with 6% improvement in FEV1 post  bronchodilator treatment. Clinically feeling improved. No coughing post treatment.  Interim history - only used Spiriva sample. Had prednisone in December but still having issues with her breathing. Not on any daily meds.  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 Clinically feeling unchanged.  Start prednisone taper. Prednisone '10mg'$  tablets - take 2 tablets for 4 days then 1 tablet on day 5.  Daily controller medication(s): start Symbicort 192mg 2 puffs twice a day and rinse mouth after each use. Demonstrated spacer use.  Continue Singulair (montelukast) '10mg'$  daily at night. May use albuterol rescue inhaler 2 puffs every 4 to 6 hours as needed for shortness of breath, chest tightness, coughing, and wheezing. May use albuterol rescue inhaler 2 puffs 5 to 15 minutes prior to strenuous physical activities. Monitor frequency of use.  Get spirometry at next visit. Reassured patient that ICS inhalers do not typically cause vaginal bleeding and patient was in agreement with starting a daily inhaler.   Seasonal and perennial allergic rhinoconjunctivitis Past history - Rhino conjunctivitis symptoms for many years with flares in the spring. Used to be on AIT for 1 year with good benefit. 1 dog at home. 2023 bloodwork was to dog, grass pollen and mold.  Interim history - controlled.  Use over the counter antihistamines such as Zyrtec (cetirizine), Claritin (loratadine), Allegra (fexofenadine), or Xyzal (levocetirizine) daily as needed. May take twice a day during allergy flares. May switch antihistamines every few months. May use Ryaltris (olopatadine + mometasone nasal spray combination) 1-2 sprays per nostril twice a day.  Nasal saline  spray (i.e., Simply Saline) or nasal saline lavage (i.e., NeilMed) is recommended as needed and prior to medicated nasal sprays. May use Refresh re-wetting eye drops.  Consider allergy injections if no  improvement in symptoms.   Recurrent infections Past history - Frequent bronchitis episodes. Up to date with flu, pneumonia and Covid-19 vaccines. 2023 bloodwork showed normal immunoglobulin levels, good protection against diptheria, tetanus and pneumoniae. Interim history - 1 antibiotics. WBC still down at 3.0. Keep track of infections and antibiotics use. Get CBC diff recheck at next visit.   Elevated blood pressure reading Monitor symptoms and follow up with PCP.   Return in about 2 months (around 12/10/2022).  Meds ordered this encounter  Medications   budesonide-formoterol (SYMBICORT) 160-4.5 MCG/ACT inhaler    Sig: Inhale 2 puffs into the lungs in the morning and at bedtime. Use with spacer and rinse mouth afterwards.    Dispense:  10.2 g    Refill:  3   predniSONE (DELTASONE) 10 MG tablet    Sig: Take 2 tablets once a day for 4 days and 1 tablet on day 5.    Dispense:  9 tablet    Refill:  0   montelukast (SINGULAIR) 10 MG tablet    Sig: Take 1 tablet (10 mg total) by mouth at bedtime.    Dispense:  90 tablet    Refill:  3   Lab Orders  No laboratory test(s) ordered today   Diagnostics: Spirometry:  Tracings reviewed. Her effort: Good reproducible efforts. FVC: 1.35L FEV1: 1.04L, 46% predicted FEV1/FVC ratio: 77% Interpretation: Spirometry consistent with possible restrictive disease with 18% improvement in FEV1 post bronchodilator treatment. Clinically feeling unchanged.   Please see scanned spirometry results for details.  Medication List:  Current Outpatient Medications  Medication Sig Dispense Refill   albuterol (VENTOLIN HFA) 108 (90 Base) MCG/ACT inhaler Inhale 2 puffs into the lungs every 6 (six) hours as needed for wheezing or shortness of breath. 54 g 3   budesonide-formoterol (SYMBICORT) 160-4.5 MCG/ACT inhaler Inhale 2 puffs into the lungs in the morning and at bedtime. Use with spacer and rinse mouth afterwards. 10.2 g 3   Cholecalciferol (VITAMIN D3 PO)  Take 1,000 Units by mouth daily.      fexofenadine (ALLEGRA) 180 MG tablet Take 180 mg by mouth daily as needed for allergies.     montelukast (SINGULAIR) 10 MG tablet Take 1 tablet (10 mg total) by mouth at bedtime. 90 tablet 3   omeprazole (PRILOSEC) 20 MG capsule Take 20 mg by mouth as needed. Prn     predniSONE (DELTASONE) 10 MG tablet Take 2 tablets once a day for 4 days and 1 tablet on day 5. 9 tablet 0   tretinoin (RETIN-A) 0.1 % cream APPLY TOPICALLY AT BEDTIME 45 g 1   escitalopram (LEXAPRO) 20 MG tablet Take 1 tablet (20 mg total) by mouth daily. 90 tablet 3   estradiol (ESTRACE) 0.5 MG tablet TAKE 1 TABLET BY MOUTH ONCE DAILY 90 tablet 0   progesterone (PROMETRIUM) 100 MG capsule TAKE 1 CAPSULE EVERY DAY AS DIRECTED 90 capsule 0   rosuvastatin (CRESTOR) 5 MG tablet Take 1 tablet (5 mg total) by mouth daily. 90 tablet 3   zolpidem (AMBIEN) 10 MG tablet Take 1 tablet (10 mg total) by mouth at bedtime. 90 tablet 1   No current facility-administered medications for this visit.   Allergies: Allergies  Allergen Reactions   Codeine Nausea Only   I reviewed her past medical history, social  history, family history, and environmental history and no significant changes have been reported from her previous visit.  Review of Systems  Constitutional:  Negative for appetite change, chills, fever and unexpected weight change.  HENT:  Negative for congestion and rhinorrhea.   Eyes:  Negative for itching.  Respiratory:  Positive for cough, chest tightness, shortness of breath and wheezing.   Cardiovascular:  Negative for chest pain.  Gastrointestinal:  Negative for abdominal pain.  Genitourinary:  Negative for difficulty urinating.  Skin:  Negative for rash.  Allergic/Immunologic: Positive for environmental allergies.    Objective: BP 124/80 (BP Location: Left Arm, Patient Position: Sitting, Cuff Size: Normal)   Pulse 84   Temp 98.3 F (36.8 C) (Temporal)   Resp 16   Ht '5\' 4"'$  (1.626  m)   Wt 156 lb 3.2 oz (70.9 kg)   SpO2 95%   BMI 26.81 kg/m  Body mass index is 26.81 kg/m. Physical Exam Vitals and nursing note reviewed.  Constitutional:      Appearance: Normal appearance. She is well-developed.  HENT:     Head: Normocephalic and atraumatic.     Right Ear: Tympanic membrane and external ear normal.     Left Ear: Tympanic membrane and external ear normal.     Nose: Nose normal.     Mouth/Throat:     Mouth: Mucous membranes are moist.     Pharynx: Oropharynx is clear.  Eyes:     Conjunctiva/sclera: Conjunctivae normal.  Cardiovascular:     Rate and Rhythm: Normal rate and regular rhythm.     Heart sounds: Normal heart sounds. No murmur heard.    No friction rub. No gallop.  Pulmonary:     Effort: Pulmonary effort is normal.     Breath sounds: Normal breath sounds. No wheezing, rhonchi or rales.  Musculoskeletal:     Cervical back: Neck supple.  Skin:    General: Skin is warm.     Findings: No rash.  Neurological:     Mental Status: She is alert and oriented to person, place, and time.  Psychiatric:        Behavior: Behavior normal.   Previous notes and tests were reviewed. The plan was reviewed with the patient/family, and all questions/concerned were addressed.  It was my pleasure to see Robin Chaney today and participate in her care. Please feel free to contact me with any questions or concerns.  Sincerely,  Rexene Alberts, DO Allergy & Immunology  [facility] office: [place] office: [phone]

## 2022-10-11 ENCOUNTER — Other Ambulatory Visit: Payer: Self-pay

## 2022-10-11 ENCOUNTER — Ambulatory Visit: Payer: Medicare Other | Admitting: Allergy

## 2022-10-11 ENCOUNTER — Encounter: Payer: Self-pay | Admitting: Allergy

## 2022-10-11 ENCOUNTER — Other Ambulatory Visit (HOSPITAL_COMMUNITY): Payer: Self-pay

## 2022-10-11 VITALS — BP 124/80 | HR 84 | Temp 98.3°F | Resp 16 | Ht 64.0 in | Wt 156.2 lb

## 2022-10-11 DIAGNOSIS — J302 Other seasonal allergic rhinitis: Secondary | ICD-10-CM

## 2022-10-11 DIAGNOSIS — J4540 Moderate persistent asthma, uncomplicated: Secondary | ICD-10-CM | POA: Diagnosis not present

## 2022-10-11 DIAGNOSIS — B999 Unspecified infectious disease: Secondary | ICD-10-CM | POA: Diagnosis not present

## 2022-10-11 DIAGNOSIS — R030 Elevated blood-pressure reading, without diagnosis of hypertension: Secondary | ICD-10-CM | POA: Diagnosis not present

## 2022-10-11 DIAGNOSIS — H1013 Acute atopic conjunctivitis, bilateral: Secondary | ICD-10-CM

## 2022-10-11 DIAGNOSIS — J3089 Other allergic rhinitis: Secondary | ICD-10-CM

## 2022-10-11 DIAGNOSIS — H1010 Acute atopic conjunctivitis, unspecified eye: Secondary | ICD-10-CM

## 2022-10-11 MED ORDER — BUDESONIDE-FORMOTEROL FUMARATE 160-4.5 MCG/ACT IN AERO
2.0000 | INHALATION_SPRAY | Freq: Two times a day (BID) | RESPIRATORY_TRACT | 3 refills | Status: DC
  Filled 2022-10-11 (×2): qty 10.2, 30d supply, fill #0

## 2022-10-11 MED ORDER — MONTELUKAST SODIUM 10 MG PO TABS: 10.0000 mg | ORAL_TABLET | Freq: Every day | ORAL | 3 refills | Status: DC

## 2022-10-11 MED ORDER — ESCITALOPRAM OXALATE 20 MG PO TABS: 20.0000 mg | ORAL_TABLET | Freq: Every day | ORAL | 3 refills | Status: DC

## 2022-10-11 MED ORDER — PREDNISONE 10 MG PO TABS
ORAL_TABLET | ORAL | 0 refills | Status: DC
  Filled 2022-10-11: qty 9, 5d supply, fill #0

## 2022-10-11 MED ORDER — ZOLPIDEM TARTRATE 10 MG PO TABS
10.0000 mg | ORAL_TABLET | Freq: Every day | ORAL | 1 refills | Status: DC
  Filled 2022-10-11: qty 90, 90d supply, fill #0

## 2022-10-11 MED ORDER — ROSUVASTATIN CALCIUM 5 MG PO TABS: 5.0000 mg | ORAL_TABLET | Freq: Every day | ORAL | 3 refills | Status: DC

## 2022-10-11 NOTE — Patient Instructions (Addendum)
Breathing  Start prednisone taper. Prednisone '10mg'$  tablets - take 2 tablets for 4 days then 1 tablet on day 5.  Daily controller medication(s): start Symbicort 158mg 2 puffs twice a day and rinse mouth after each use. Continue Singulair (montelukast) '10mg'$  daily at night. May use albuterol rescue inhaler 2 puffs every 4 to 6 hours as needed for shortness of breath, chest tightness, coughing, and wheezing. May use albuterol rescue inhaler 2 puffs 5 to 15 minutes prior to strenuous physical activities. Monitor frequency of use.  Breathing control goals:  Full participation in all desired activities (may need albuterol before activity) Albuterol use two times or less a week on average (not counting use with activity) Cough interfering with sleep two times or less a month Oral steroids no more than once a year No hospitalizations   Environmental allergies 2023 bloodwork was to dog, grass pollen and mold.  Use over the counter antihistamines such as Zyrtec (cetirizine), Claritin (loratadine), Allegra (fexofenadine), or Xyzal (levocetirizine) daily as needed. May take twice a day during allergy flares. May switch antihistamines every few months. May use Ryaltris (olopatadine + mometasone nasal spray combination) 1-2 sprays per nostril twice a day.  Nasal saline spray (i.e., Simply Saline) or nasal saline lavage (i.e., NeilMed) is recommended as needed and prior to medicated nasal sprays. May use Refresh re-wetting eye drops.   Recurrent infections Keep track of infections and antibiotics use. Get CBC diff recheck at next visit.   Elevated blood pressure Monitor symptoms and follow up with PCP.   Follow up in 2 months or sooner if needed.   Pet Allergen Avoidance: Contrary to popular opinion, there are no "hypoallergenic" breeds of dogs or cats. That is because people are not allergic to an animal's hair, but to an allergen found in the animal's saliva, dander (dead skin flakes) or urine. Pet  allergy symptoms typically occur within minutes. For some people, symptoms can build up and become most severe 8 to 12 hours after contact with the animal. People with severe allergies can experience reactions in public places if dander has been transported on the pet owners' clothing. Keeping an animal outdoors is only a partial solution, since homes with pets in the yard still have higher concentrations of animal allergens. Before getting a pet, ask your allergist to determine if you are allergic to animals. If your pet is already considered part of your family, try to minimize contact and keep the pet out of the bedroom and other rooms where you spend a great deal of time. As with dust mites, vacuum carpets often or replace carpet with a hardwood floor, tile or linoleum. High-efficiency particulate air (HEPA) cleaners can reduce allergen levels over time. While dander and saliva are the source of cat and dog allergens, urine is the source of allergens from rabbits, hamsters, mice and g[place]; so ask a non-allergic family member to clean the animal's cage. If you have a pet allergy, talk to your allergist about the potential for allergy immunotherapy (allergy shots). This strategy can often provide long-term relief. Reducing Pollen Exposure Pollen seasons: trees (spring), grass (summer) and ragweed/weeds (fall). Keep windows closed in your home and car to lower pollen exposure.  Install air conditioning in the bedroom and throughout the house if possible.  Avoid going out in dry windy days - especially early morning. Pollen counts are highest between 5 - 10 AM and on dry, hot and windy days.  Save outside activities for late afternoon or after a  heavy rain, when pollen levels are lower.  Avoid mowing of grass if you have grass pollen allergy. Be aware that pollen can also be transported indoors on people and pets.  Dry your clothes in an automatic dryer rather than hanging them outside where  they might collect pollen.  Rinse hair and eyes before bedtime. Mold Control Mold and fungi can grow on a variety of surfaces provided certain temperature and moisture conditions exist.  Outdoor molds grow on plants, decaying vegetation and soil. The major outdoor mold, Alternaria and Cladosporium, are found in very high numbers during hot and dry conditions. Generally, a late summer - fall peak is seen for common outdoor fungal spores. Rain will temporarily lower outdoor mold spore count, but counts rise rapidly when the rainy period ends. The most important indoor molds are Aspergillus and Penicillium. Dark, humid and poorly ventilated basements are ideal sites for mold growth. The next most common sites of mold growth are the bathroom and the kitchen. Outdoor (Seasonal) Mold Control Use air conditioning and keep windows closed. Avoid exposure to decaying vegetation. Avoid leaf raking. Avoid grain handling. Consider wearing a face mask if working in moldy areas.  Indoor (Perennial) Mold Control  Maintain humidity below 50%. Get rid of mold growth on hard surfaces with water, detergent and, if necessary, 5% bleach (do not mix with other cleaners). Then dry the area completely. If mold covers an area more than 10 square feet, consider hiring an indoor environmental professional. For clothing, washing with soap and water is best. If moldy items cannot be cleaned and dried, throw them away. Remove sources e.g. contaminated carpets. Repair and seal leaking roofs or pipes. Using dehumidifiers in damp basements may be helpful, but empty the water and clean units regularly to prevent mildew from forming. All rooms, especially basements, bathrooms and kitchens, require ventilation and cleaning to deter mold and mildew growth. Avoid carpeting on concrete or damp floors, and storing items in damp areas.

## 2022-10-11 NOTE — Addendum Note (Signed)
Addended by: Geradine Girt D on: 10/11/2022 12:47 PM   Modules accepted: Orders

## 2022-10-11 NOTE — Assessment & Plan Note (Signed)
Monitor symptoms and follow up with PCP.

## 2022-10-11 NOTE — Assessment & Plan Note (Signed)
Past history - Rhino conjunctivitis symptoms for many years with flares in the spring. Used to be on AIT for 1 year with good benefit. 1 dog at home. 2023 bloodwork was to dog, grass pollen and mold.  Interim history - controlled.  Use over the counter antihistamines such as Zyrtec (cetirizine), Claritin (loratadine), Allegra (fexofenadine), or Xyzal (levocetirizine) daily as needed. May take twice a day during allergy flares. May switch antihistamines every few months. May use Ryaltris (olopatadine + mometasone nasal spray combination) 1-2 sprays per nostril twice a day.  Nasal saline spray (i.e., Simply Saline) or nasal saline lavage (i.e., NeilMed) is recommended as needed and prior to medicated nasal sprays. May use Refresh re-wetting eye drops.  Consider allergy injections if no improvement in symptoms.

## 2022-10-11 NOTE — Assessment & Plan Note (Signed)
Past history - Frequent bronchitis episodes. Up to date with flu, pneumonia and Covid-19 vaccines. 2023 bloodwork showed normal immunoglobulin levels, good protection against diptheria, tetanus and pneumoniae. Interim history - 1 antibiotics. WBC still down at 3.0. Keep track of infections and antibiotics use. Get CBC diff recheck at next visit.

## 2022-10-11 NOTE — Telephone Encounter (Addendum)
Patient said that her insurance changed and she needs [facility]. Phone number [phone] escitalopram (LEXAPRO) 20 MG tablet  zolpidem (AMBIEN) 10 MG tablet  rosuvastatin (CRESTOR) 5 MG tablet

## 2022-10-11 NOTE — Assessment & Plan Note (Addendum)
Past history - Issue with her breathing on and off but the past 3 months significantly worsened with post tussive emesis and wheezing. Had 2 steroid injections and 2 oral prednisone. 2023 CXR showed peribronchial thickening. Using albuterol once a day at least. Used Symbicort for 1 week then stopped as she was concerned if vaginal bleeding was caused by this inhaler. Covid-19 infection 1 year ago. 2023 spirometry showed: possible restrictive disease with 6% improvement in FEV1 post bronchodilator treatment. Clinically feeling improved. No coughing post treatment.  Interim history - only used Spiriva sample. Had prednisone in December but still having issues with her breathing. Not on any daily meds.  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 Clinically feeling unchanged.  Start prednisone taper. Prednisone '10mg'$  tablets - take 2 tablets for 4 days then 1 tablet on day 5.  Daily controller medication(s): start Symbicort 158mg 2 puffs twice a day and rinse mouth after each use. Demonstrated spacer use.  Continue Singulair (montelukast) '10mg'$  daily at night. May use albuterol rescue inhaler 2 puffs every 4 to 6 hours as needed for shortness of breath, chest tightness, coughing, and wheezing. May use albuterol rescue inhaler 2 puffs 5 to 15 minutes prior to strenuous physical activities. Monitor frequency of use.  Get spirometry at next visit. Reassured patient that ICS inhalers do not typically cause vaginal bleeding and patient was in agreement with starting a daily inhaler.

## 2022-10-14 ENCOUNTER — Other Ambulatory Visit: Payer: Self-pay

## 2022-10-14 ENCOUNTER — Encounter: Payer: Self-pay | Admitting: Internal Medicine

## 2022-10-14 ENCOUNTER — Other Ambulatory Visit (HOSPITAL_COMMUNITY): Payer: Self-pay

## 2022-10-14 ENCOUNTER — Telehealth: Payer: Self-pay | Admitting: Internal Medicine

## 2022-10-14 IMAGING — CR DG CHEST 2V
2 series · 2 of 2 positions shown · non-contrast
Comparison: 03/07/2018

CLINICAL DATA: Coughing and wheezing for 2 weeks, history of asthma

EXAM:
CHEST - 2 VIEW

[w chest pa]
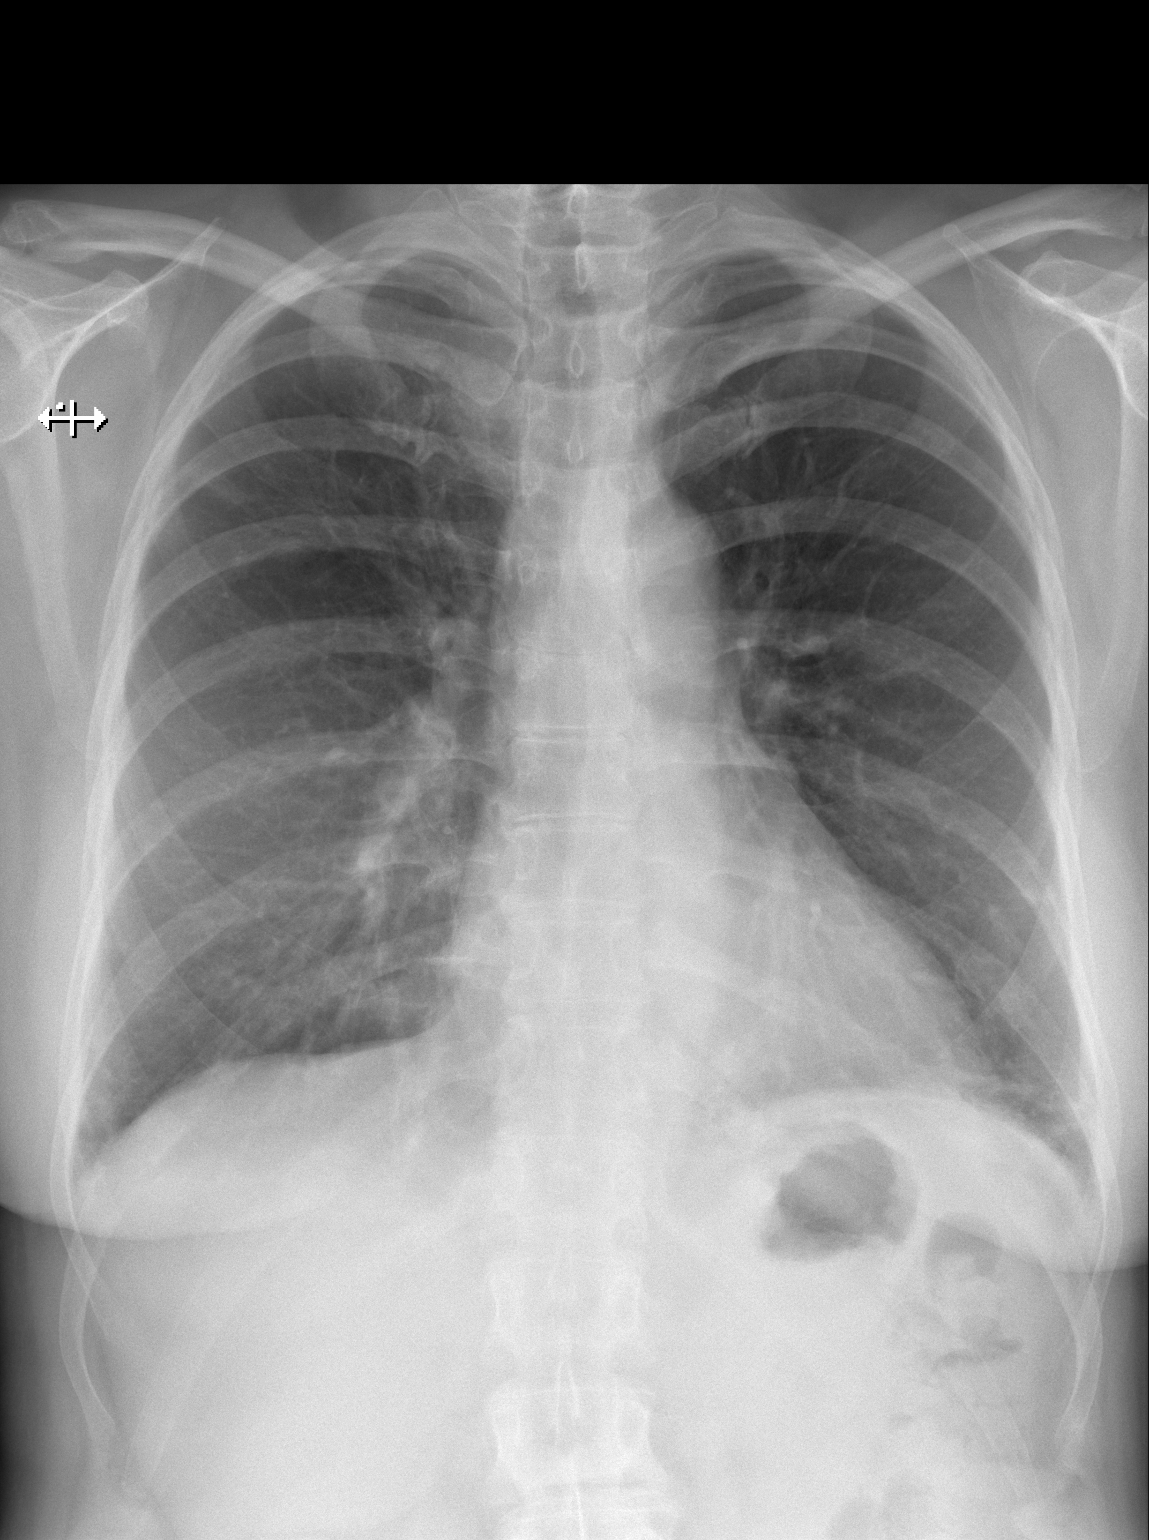

[w chest lat]
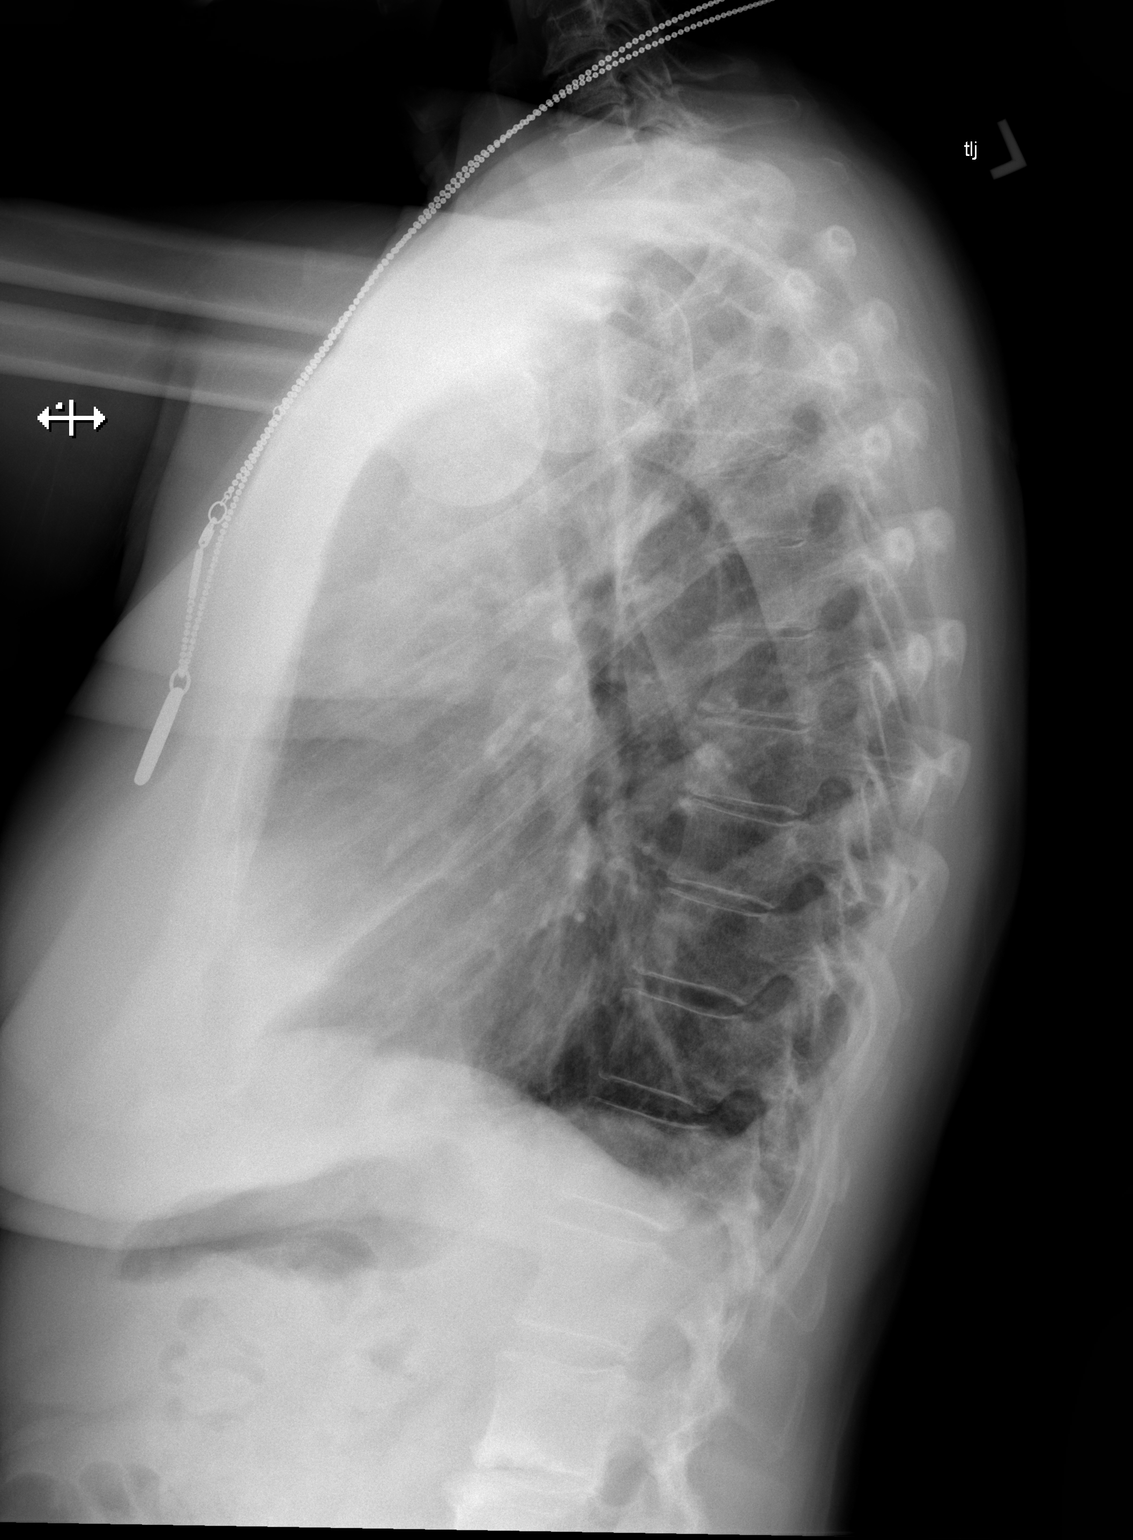

[2 of 2 positions shown; findings below may reference images not displayed]

FINDINGS: Cardiac and mediastinal contours are within normal limits.
Peribronchial thickening. No focal pulmonary opacity. No pleural
effusion or pneumothorax. No acute osseous abnormality.
IMPRESSION: Peribronchial thickening, which can be seen in the setting of small
airways disease (viral versus reactive). No focal pulmonary opacity
to suggest pneumonia.

## 2022-10-14 MED ORDER — ZOLPIDEM TARTRATE 10 MG PO TABS: 10.0000 mg | ORAL_TABLET | Freq: Every day | ORAL | 1 refills | Status: DC

## 2022-10-14 NOTE — Telephone Encounter (Signed)
I have sent in today by electronic prescribing a 6 month supply of Ambien 9#90 with one refill) One tab at bedtime to [facility]. [place] PMP Aware checked.   MJB, MD

## 2022-10-14 NOTE — Telephone Encounter (Signed)
[facility] called back and said that [facility] all [facility] except the zolpidem (AMBIEN) 10 MG tablet. And she asked if we could please send to them again

## 2022-10-16 ENCOUNTER — Other Ambulatory Visit: Payer: Self-pay

## 2022-10-16 ENCOUNTER — Other Ambulatory Visit (HOSPITAL_COMMUNITY): Payer: Self-pay

## 2022-10-16 ENCOUNTER — Telehealth: Payer: Self-pay

## 2022-10-16 ENCOUNTER — Other Ambulatory Visit: Payer: Self-pay | Admitting: Internal Medicine

## 2022-10-16 MED ORDER — ZOLPIDEM TARTRATE 10 MG PO TABS: 10.0000 mg | ORAL_TABLET | Freq: Every day | ORAL | 1 refills | Status: DC

## 2022-10-16 NOTE — Telephone Encounter (Signed)
Patient called and wanted you to know she has reached out to Medicare to get name changed

## 2022-10-16 NOTE — Telephone Encounter (Signed)
I have been in touch with her mail order pharmacy about refill on Ambien. The issue is ahe has last name of Modlin on her insurance card and not Yaden. I have spoken with 3 different people and spent about 20 minutes on this issue. They will not refill Ambien by mail order as to them her name is Modlin and not Mikolajczak. [place] and they won't take my word for it. She will need to handle this with the insurance carrier. I have done all I can do about this. MJB, MD

## 2022-10-16 NOTE — Addendum Note (Signed)
Addended by: Geradine Girt D on: 10/16/2022 12:09 PM   Modules accepted: Orders

## 2022-10-16 NOTE — Telephone Encounter (Signed)
Spoke with patient and pharmacy and Rx was never received pharmacy is asking that it be resubmitted

## 2022-10-18 ENCOUNTER — Other Ambulatory Visit (HOSPITAL_COMMUNITY): Payer: Self-pay

## 2022-10-18 ENCOUNTER — Other Ambulatory Visit: Payer: Self-pay | Admitting: Internal Medicine

## 2022-10-21 ENCOUNTER — Other Ambulatory Visit (HOSPITAL_COMMUNITY): Payer: Self-pay

## 2022-10-22 ENCOUNTER — Other Ambulatory Visit: Payer: Medicare PPO

## 2022-10-22 DIAGNOSIS — M79604 Pain in right leg: Secondary | ICD-10-CM | POA: Diagnosis not present

## 2022-10-22 DIAGNOSIS — R269 Unspecified abnormalities of gait and mobility: Secondary | ICD-10-CM | POA: Diagnosis not present

## 2022-10-22 DIAGNOSIS — M545 Low back pain, unspecified: Secondary | ICD-10-CM | POA: Diagnosis not present

## 2022-10-23 ENCOUNTER — Other Ambulatory Visit (HOSPITAL_COMMUNITY): Payer: Self-pay

## 2022-10-23 ENCOUNTER — Ambulatory Visit: Payer: Medicare PPO | Admitting: Internal Medicine

## 2022-10-24 ENCOUNTER — Telehealth: Payer: Self-pay | Admitting: Internal Medicine

## 2022-10-24 ENCOUNTER — Other Ambulatory Visit (HOSPITAL_COMMUNITY): Payer: Self-pay

## 2022-10-24 ENCOUNTER — Encounter: Payer: Self-pay | Admitting: Internal Medicine

## 2022-10-24 ENCOUNTER — Ambulatory Visit: Payer: Medicare PPO | Admitting: Internal Medicine

## 2022-10-24 MED ORDER — ZOLPIDEM TARTRATE 10 MG PO TABS
10.0000 mg | ORAL_TABLET | Freq: Every day | ORAL | 1 refills | Status: DC
  Filled 2022-10-24 – 2022-11-29 (×6): qty 90, 90d supply, fill #0

## 2022-10-24 NOTE — Telephone Encounter (Signed)
Sent in Ambien to [facility] to take qhs. #90 with one refill

## 2022-10-24 NOTE — Telephone Encounter (Signed)
Tony called today to say the pharmacy never received her below medication. She has also gotten the form and started the process of getting her name changed.  90 day supply.  zolpidem (AMBIEN) 10 MG tablet   [place] Phone: [phone]  Fax: [phone]

## 2022-10-25 ENCOUNTER — Other Ambulatory Visit (HOSPITAL_COMMUNITY): Payer: Self-pay

## 2022-10-28 ENCOUNTER — Other Ambulatory Visit (HOSPITAL_COMMUNITY): Payer: Self-pay

## 2022-11-02 ENCOUNTER — Encounter: Payer: Self-pay | Admitting: Internal Medicine

## 2022-11-02 NOTE — Patient Instructions (Signed)
You have been diagnosed with acute lower respiratory infection.  Please continue to use albuterol and Symbicort inhalers.  May take Tessalon Perles for cough or Hycodan 1 teaspoon every 8 hours as needed.  Please start Levaquin 500 milligrams daily with a meal for 10 days.  Rest and stay well-hydrated.  Monitor pulse oximetry.  Call if not improving in 48 hours or sooner if worse.

## 2022-11-04 ENCOUNTER — Other Ambulatory Visit: Payer: Self-pay | Admitting: *Deleted

## 2022-11-04 ENCOUNTER — Telehealth: Payer: Self-pay | Admitting: Allergy

## 2022-11-04 MED ORDER — BUDESONIDE-FORMOTEROL FUMARATE 160-4.5 MCG/ACT IN AERO: 2.0000 | INHALATION_SPRAY | Freq: Two times a day (BID) | RESPIRATORY_TRACT | 1 refills | Status: DC

## 2022-11-04 NOTE — Telephone Encounter (Signed)
Patient called and stated that she needs refills on her symbicort. [phone]. Patients call back number is [phone]

## 2022-11-04 NOTE — Telephone Encounter (Signed)
Refills have been sent in to the requested pharmacy. Called patient and informed. Patient verbalized understanding.

## 2022-11-04 NOTE — Telephone Encounter (Signed)
Noted  

## 2022-11-05 DIAGNOSIS — M79604 Pain in right leg: Secondary | ICD-10-CM | POA: Diagnosis not present

## 2022-11-05 DIAGNOSIS — M545 Low back pain, unspecified: Secondary | ICD-10-CM | POA: Diagnosis not present

## 2022-11-05 DIAGNOSIS — R269 Unspecified abnormalities of gait and mobility: Secondary | ICD-10-CM | POA: Diagnosis not present

## 2022-11-13 ENCOUNTER — Other Ambulatory Visit: Payer: Self-pay | Admitting: Obstetrics and Gynecology

## 2022-11-13 DIAGNOSIS — N6310 Unspecified lump in the right breast, unspecified quadrant: Secondary | ICD-10-CM

## 2022-11-15 ENCOUNTER — Other Ambulatory Visit (HOSPITAL_COMMUNITY): Payer: Self-pay

## 2022-11-15 DIAGNOSIS — M5441 Lumbago with sciatica, right side: Secondary | ICD-10-CM | POA: Diagnosis not present

## 2022-11-15 DIAGNOSIS — M5442 Lumbago with sciatica, left side: Secondary | ICD-10-CM | POA: Diagnosis not present

## 2022-11-15 DIAGNOSIS — Z6826 Body mass index (BMI) 26.0-26.9, adult: Secondary | ICD-10-CM | POA: Diagnosis not present

## 2022-11-19 DIAGNOSIS — M79604 Pain in right leg: Secondary | ICD-10-CM | POA: Diagnosis not present

## 2022-11-19 DIAGNOSIS — R269 Unspecified abnormalities of gait and mobility: Secondary | ICD-10-CM | POA: Diagnosis not present

## 2022-11-19 DIAGNOSIS — M545 Low back pain, unspecified: Secondary | ICD-10-CM | POA: Diagnosis not present

## 2022-11-20 DIAGNOSIS — Z1151 Encounter for screening for human papillomavirus (HPV): Secondary | ICD-10-CM | POA: Diagnosis not present

## 2022-11-20 DIAGNOSIS — Z01419 Encounter for gynecological examination (general) (routine) without abnormal findings: Secondary | ICD-10-CM | POA: Diagnosis not present

## 2022-11-20 DIAGNOSIS — Z6827 Body mass index (BMI) 27.0-27.9, adult: Secondary | ICD-10-CM | POA: Diagnosis not present

## 2022-11-20 DIAGNOSIS — Z124 Encounter for screening for malignant neoplasm of cervix: Secondary | ICD-10-CM | POA: Diagnosis not present

## 2022-11-22 ENCOUNTER — Other Ambulatory Visit (HOSPITAL_COMMUNITY): Payer: Self-pay

## 2022-11-25 ENCOUNTER — Other Ambulatory Visit: Payer: Medicare PPO

## 2022-11-26 ENCOUNTER — Other Ambulatory Visit (HOSPITAL_COMMUNITY): Payer: Self-pay

## 2022-11-29 ENCOUNTER — Other Ambulatory Visit (HOSPITAL_COMMUNITY): Payer: Self-pay

## 2022-11-29 ENCOUNTER — Other Ambulatory Visit: Payer: Self-pay

## 2022-12-03 DIAGNOSIS — M545 Low back pain, unspecified: Secondary | ICD-10-CM | POA: Diagnosis not present

## 2022-12-03 DIAGNOSIS — M79604 Pain in right leg: Secondary | ICD-10-CM | POA: Diagnosis not present

## 2022-12-03 DIAGNOSIS — R269 Unspecified abnormalities of gait and mobility: Secondary | ICD-10-CM | POA: Diagnosis not present

## 2022-12-11 ENCOUNTER — Ambulatory Visit: Payer: Medicare PPO | Admitting: Allergy

## 2022-12-13 DIAGNOSIS — M4316 Spondylolisthesis, lumbar region: Secondary | ICD-10-CM | POA: Diagnosis not present

## 2022-12-13 DIAGNOSIS — M4807 Spinal stenosis, lumbosacral region: Secondary | ICD-10-CM | POA: Diagnosis not present

## 2022-12-13 DIAGNOSIS — M51A4 Intervertebral annulus fibrosus defect, small, lumbosacral region: Secondary | ICD-10-CM | POA: Diagnosis not present

## 2022-12-13 DIAGNOSIS — M5451 Vertebrogenic low back pain: Secondary | ICD-10-CM | POA: Diagnosis not present

## 2022-12-14 ENCOUNTER — Other Ambulatory Visit (HOSPITAL_COMMUNITY): Payer: Self-pay

## 2022-12-14 MED ORDER — METHYLPREDNISOLONE 4 MG PO TBPK
ORAL_TABLET | ORAL | 0 refills | Status: DC
  Filled 2022-12-14: qty 21, 6d supply, fill #0

## 2022-12-16 DIAGNOSIS — M47816 Spondylosis without myelopathy or radiculopathy, lumbar region: Secondary | ICD-10-CM | POA: Diagnosis not present

## 2022-12-16 DIAGNOSIS — M5451 Vertebrogenic low back pain: Secondary | ICD-10-CM | POA: Diagnosis not present

## 2022-12-23 DIAGNOSIS — M5451 Vertebrogenic low back pain: Secondary | ICD-10-CM | POA: Diagnosis not present

## 2022-12-23 DIAGNOSIS — M47816 Spondylosis without myelopathy or radiculopathy, lumbar region: Secondary | ICD-10-CM | POA: Diagnosis not present

## 2022-12-27 ENCOUNTER — Ambulatory Visit
Admission: RE | Admit: 2022-12-27 | Discharge: 2022-12-27 | Disposition: A | Discharge status: Home / Self Care | Payer: Medicare PPO | Source: Ambulatory Visit | Attending: Obstetrics and Gynecology | Admitting: Obstetrics and Gynecology

## 2022-12-27 ENCOUNTER — Ambulatory Visit
Admission: RE | Admit: 2022-12-27 | Discharge: 2022-12-27 | Disposition: A | Discharge status: Home / Self Care | Payer: Medicare Other | Source: Ambulatory Visit | Attending: Obstetrics and Gynecology | Admitting: Obstetrics and Gynecology

## 2022-12-27 DIAGNOSIS — N6310 Unspecified lump in the right breast, unspecified quadrant: Secondary | ICD-10-CM

## 2022-12-27 DIAGNOSIS — R928 Other abnormal and inconclusive findings on diagnostic imaging of breast: Secondary | ICD-10-CM | POA: Diagnosis not present

## 2022-12-27 DIAGNOSIS — N6311 Unspecified lump in the right breast, upper outer quadrant: Secondary | ICD-10-CM | POA: Diagnosis not present

## 2023-01-13 DIAGNOSIS — M47816 Spondylosis without myelopathy or radiculopathy, lumbar region: Secondary | ICD-10-CM | POA: Diagnosis not present

## 2023-01-13 DIAGNOSIS — M5451 Vertebrogenic low back pain: Secondary | ICD-10-CM | POA: Diagnosis not present

## 2023-01-27 ENCOUNTER — Other Ambulatory Visit: Payer: Self-pay

## 2023-01-27 MED ORDER — ZOLPIDEM TARTRATE 10 MG PO TABS: 10.0000 mg | ORAL_TABLET | Freq: Every day | ORAL | 1 refills | Status: DC

## 2023-01-27 NOTE — Telephone Encounter (Signed)
Patient called stating she has changed her name with Social Security and would like Rx refill  zolpidem (AMBIEN) 10 MG tablet [facility].  She also asked if we could call her to let her know it has been sent in, ok to leave voicemail.

## 2023-02-10 ENCOUNTER — Other Ambulatory Visit: Payer: Self-pay

## 2023-02-10 MED ORDER — TRETINOIN 0.1 % EX CREA: TOPICAL_CREAM | CUTANEOUS | 1 refills | Status: DC

## 2023-02-10 MED ORDER — ALBUTEROL SULFATE HFA 108 (90 BASE) MCG/ACT IN AERS: 2.0000 | INHALATION_SPRAY | Freq: Four times a day (QID) | RESPIRATORY_TRACT | 3 refills | Status: DC | PRN

## 2023-02-18 ENCOUNTER — Other Ambulatory Visit: Payer: Self-pay

## 2023-02-18 MED ORDER — ZOLPIDEM TARTRATE 10 MG PO TABS: 10.0000 mg | ORAL_TABLET | Freq: Every day | ORAL | 1 refills | Status: DC

## 2023-02-24 ENCOUNTER — Other Ambulatory Visit: Payer: Self-pay | Admitting: Internal Medicine

## 2023-02-24 ENCOUNTER — Other Ambulatory Visit (HOSPITAL_COMMUNITY): Payer: Self-pay

## 2023-02-24 MED ORDER — TRETINOIN 0.1 % EX CREA
TOPICAL_CREAM | CUTANEOUS | 1 refills | Status: DC
  Filled 2023-02-24: qty 20, 15d supply, fill #0

## 2023-02-25 ENCOUNTER — Other Ambulatory Visit (HOSPITAL_COMMUNITY): Payer: Self-pay

## 2023-03-13 ENCOUNTER — Telehealth: Payer: Self-pay | Admitting: Internal Medicine

## 2023-03-13 NOTE — Telephone Encounter (Signed)
Robin Chaney [phone]  [name] called to say she has poison Ivy all over her legs. I scheduled her to come in at 11:00 tomorrow.

## 2023-03-14 ENCOUNTER — Other Ambulatory Visit (HOSPITAL_COMMUNITY): Payer: Self-pay

## 2023-03-14 ENCOUNTER — Ambulatory Visit (INDEPENDENT_AMBULATORY_CARE_PROVIDER_SITE_OTHER): Payer: Medicare Other | Admitting: Internal Medicine

## 2023-03-14 VITALS — BP 130/76 | HR 67 | Temp 98.5°F | Ht 64.0 in | Wt 153.8 lb

## 2023-03-14 DIAGNOSIS — L237 Allergic contact dermatitis due to plants, except food: Secondary | ICD-10-CM | POA: Diagnosis not present

## 2023-03-14 MED ORDER — METHYLPREDNISOLONE 4 MG PO TBPK
ORAL_TABLET | ORAL | 0 refills | Status: DC
  Filled 2023-03-14: qty 21, 6d supply, fill #0

## 2023-03-14 NOTE — Patient Instructions (Signed)
Medrol Dosepack 6 day 4 mg to take in tapering course as directed 6-5-4-3-2-1 taper.

## 2023-03-14 NOTE — Progress Notes (Signed)
Patient Care Team: Margaree Mackintosh, MD as PCP - General  Visit Date: 03/14/23  Subjective:    Patient ID: Robin Chaney , Female   DOB: 08-May-1951, 72 y.o.    MRN: [id number]   72 y.o. Female presents today for itching rash on lower legs. Taking ibuprofen, hydrocortisone cream without relief. Would like something to calm this down ahead of upcoming wedding.  Past Medical History:  Diagnosis Date   Allergic conjunctivitis    Allergic rhinitis    Asthma    Recurrent upper respiratory infection (URI)      Family History  Problem Relation Age of Onset   Emphysema Other        GF   Allergies Mother    Asthma Mother    Rheum arthritis Mother    Breast cancer Mother    Heart disease Mother    Allergies Sister    Lung cancer Father        was a smoker   Heart disease Father     Social History   Social History Narrative   Not on file      Review of Systems  Constitutional:  Negative for fever and malaise/fatigue.  HENT:  Negative for congestion.   Eyes:  Negative for blurred vision.  Respiratory:  Negative for cough and shortness of breath.   Cardiovascular:  Negative for chest pain, palpitations and leg swelling.  Gastrointestinal:  Negative for vomiting.  Musculoskeletal:  Negative for back pain.  Skin:  Positive for itching and rash (Lower legs).  Neurological:  Negative for loss of consciousness and headaches.        Objective:   Vitals: BP 130/76 (BP Location: Right Arm, Patient Position: Sitting, Cuff Size: Large)   Pulse 67   Temp 98.5 F (36.9 C) (Tympanic)   Ht 5\' 4"  (1.626 m)   Wt 153 lb 12 oz (69.7 kg)   SpO2 96%   BMI 26.39 kg/m    Physical Exam Vitals and nursing note reviewed.  Constitutional:      General: She is not in acute distress.    Appearance: Normal appearance. She is not toxic-appearing.  HENT:     Head: Normocephalic and atraumatic.  Pulmonary:     Effort: Pulmonary effort is normal.  Skin:    General: Skin is  warm and dry.     Comments: Two small circular, erythematous lesions with papules left lower extremity anteriorly consistent with contact dermatitis/poison ivy.  Neurological:     Mental Status: She is alert and oriented to person, place, and time. Mental status is at baseline.  Psychiatric:        Mood and Affect: Mood normal.        Behavior: Behavior normal.        Thought Content: Thought content normal.        Judgment: Judgment normal.       Results:   Studies obtained and personally reviewed by me:   Labs:       Component Value Date/Time   NA 139 07/22/2022 0945   K 4.6 07/22/2022 0945   CL 106 07/22/2022 0945   CO2 24 07/22/2022 0945   GLUCOSE 85 07/22/2022 0945   BUN 16 07/22/2022 0945   CREATININE 0.51 (L) 07/22/2022 0945   CALCIUM 8.7 07/22/2022 0945   PROT 6.6 07/22/2022 0945   ALBUMIN 3.8 05/17/2016 1131   AST 16 07/22/2022 0945   ALT 12 07/22/2022 0945   ALKPHOS 61 05/17/2016  1131   BILITOT 0.4 07/22/2022 0945   GFRNONAA 94 12/03/2019 0912   GFRAA 109 12/03/2019 0912     Lab Results  Component Value Date   WBC 3.0 (L) 07/22/2022   HGB 13.6 07/22/2022   HCT 40.1 07/22/2022   MCV 92.6 07/22/2022   PLT 292 07/22/2022    Lab Results  Component Value Date   CHOL 172 07/22/2022   HDL 52 07/22/2022   LDLCALC 78 07/22/2022   TRIG 349 (H) 07/22/2022   CHOLHDL 3.3 07/22/2022    No results found for: "HGBA1C"   Lab Results  Component Value Date   TSH 2.70 07/22/2022      Assessment & Plan:   Contact dermatitis/poison ivy: prescribed Medrol dosepak tapering course take as directed. Contact us if symptoms do not improve.    I,Alexander Ruley,acting as a Neurosurgeon for Margaree Mackintosh, MD.,have documented all relevant documentation on the behalf of Margaree Mackintosh, MD,as directed by  Margaree Mackintosh, MD while in the presence of Margaree Mackintosh, MD.   I, Margaree Mackintosh, MD, have reviewed all documentation for this visit. The documentation on 03/27/23 for  the exam, diagnosis, procedures, and orders are all accurate and complete.

## 2023-03-19 ENCOUNTER — Other Ambulatory Visit (HOSPITAL_COMMUNITY): Payer: Self-pay

## 2023-03-19 DIAGNOSIS — S0502XA Injury of conjunctiva and corneal abrasion without foreign body, left eye, initial encounter: Secondary | ICD-10-CM | POA: Diagnosis not present

## 2023-03-19 MED ORDER — MOXIFLOXACIN HCL 0.5 % OP SOLN
1.0000 [drp] | Freq: Four times a day (QID) | OPHTHALMIC | 1 refills | Status: DC
  Filled 2023-03-19: qty 3, 15d supply, fill #0

## 2023-03-27 ENCOUNTER — Encounter: Payer: Self-pay | Admitting: Internal Medicine

## 2023-05-13 DIAGNOSIS — M5416 Radiculopathy, lumbar region: Secondary | ICD-10-CM | POA: Diagnosis not present

## 2023-05-13 DIAGNOSIS — M5116 Intervertebral disc disorders with radiculopathy, lumbar region: Secondary | ICD-10-CM | POA: Diagnosis not present

## 2023-05-20 ENCOUNTER — Telehealth: Payer: Self-pay | Admitting: Internal Medicine

## 2023-05-20 ENCOUNTER — Ambulatory Visit (INDEPENDENT_AMBULATORY_CARE_PROVIDER_SITE_OTHER): Payer: Medicare Other | Admitting: Internal Medicine

## 2023-05-20 ENCOUNTER — Encounter: Payer: Self-pay | Admitting: Internal Medicine

## 2023-05-20 VITALS — BP 128/70 | HR 57 | Ht 64.0 in | Wt 152.0 lb

## 2023-05-20 DIAGNOSIS — Z7184 Encounter for health counseling related to travel: Secondary | ICD-10-CM

## 2023-05-20 MED ORDER — ALPRAZOLAM 0.5 MG PO TABS: 0.5000 mg | ORAL_TABLET | Freq: Two times a day (BID) | ORAL | 0 refills | Status: DC | PRN

## 2023-05-20 MED ORDER — AZITHROMYCIN 250 MG PO TABS: ORAL_TABLET | ORAL | 0 refills | Status: AC

## 2023-05-20 MED ORDER — ONDANSETRON HCL 4 MG PO TABS: 4.0000 mg | ORAL_TABLET | Freq: Three times a day (TID) | ORAL | 0 refills | Status: DC | PRN

## 2023-05-20 NOTE — Progress Notes (Signed)
Patient Care Team: Margaree Mackintosh, MD as PCP - General  Visit Date: 05/20/23  Subjective:    Patient ID: Robin Chaney , Female   DOB: Mar 23, 1951, 72 y.o.    MRN: [id number]   72 y.o. Female presents today to discuss medications for trip to [place].  On Thursday, she is leaving on a 13-day trip to [place] and she is requesting new prescriptions for certain medications. She would like Xanax to use as needed for sleep, and a medication for nausea. She also would like Z-pak and  perhaps scopolamine patches. She confirms that she was previously intolerant of Cipro due to gastric pain.  Since she will be traveling by airplane, she received a spinal injection last week which has helped to manage her back pain.   Past Medical History:  Diagnosis Date   Allergic conjunctivitis    Allergic rhinitis    Asthma    Recurrent upper respiratory infection (URI)      Family History  Problem Relation Age of Onset   Emphysema Other        GF   Allergies Mother    Asthma Mother    Rheum arthritis Mother    Breast cancer Mother    Heart disease Mother    Allergies Sister    Lung cancer Father        was a smoker   Heart disease Father     Social Hx: Retired Engineer, civil (consulting). Single. Resides alone.    Review of Systems  Constitutional:  Negative for fever and malaise/fatigue.  HENT:  Negative for congestion.   Eyes:  Negative for blurred vision.  Respiratory:  Negative for cough and shortness of breath.   Cardiovascular:  Negative for chest pain, palpitations and leg swelling.  Gastrointestinal:  Negative for vomiting.  Musculoskeletal:  Negative for back pain.  Skin:  Negative for rash.  Neurological:  Negative for loss of consciousness and headaches.        Objective:   Vitals: BP 128/70   Pulse (!) 57   Ht 5\' 4"  (1.626 m)   Wt 152 lb (68.9 kg)   SpO2 97%   BMI 26.09 kg/m    Physical Exam Vitals and nursing note reviewed.  Constitutional:      General: She is not in  acute distress.    Appearance: Normal appearance. She is not toxic-appearing.  HENT:     Head: Normocephalic and atraumatic.  Pulmonary:     Effort: Pulmonary effort is normal.  Skin:    General: Skin is warm and dry.  Neurological:     Mental Status: She is alert and oriented to person, place, and time. Mental status is at baseline.  Psychiatric:        Mood and Affect: Mood normal.        Behavior: Behavior normal.        Thought Content: Thought content normal.        Judgment: Judgment normal.       Results:   Studies obtained and personally reviewed by me:   Labs:       Component Value Date/Time   NA 139 07/22/2022 0945   K 4.6 07/22/2022 0945   CL 106 07/22/2022 0945   CO2 24 07/22/2022 0945   GLUCOSE 85 07/22/2022 0945   BUN 16 07/22/2022 0945   CREATININE 0.51 (L) 07/22/2022 0945   CALCIUM 8.7 07/22/2022 0945   PROT 6.6 07/22/2022 0945   ALBUMIN 3.8 05/17/2016 1131  AST 16 07/22/2022 0945   ALT 12 07/22/2022 0945   ALKPHOS 61 05/17/2016 1131   BILITOT 0.4 07/22/2022 0945   GFRNONAA 94 12/03/2019 0912   GFRAA 109 12/03/2019 0912     Lab Results  Component Value Date   WBC 3.0 (L) 07/22/2022   HGB 13.6 07/22/2022   HCT 40.1 07/22/2022   MCV 92.6 07/22/2022   PLT 292 07/22/2022    Lab Results  Component Value Date   CHOL 172 07/22/2022   HDL 52 07/22/2022   LDLCALC 78 07/22/2022   TRIG 349 (H) 07/22/2022   CHOLHDL 3.3 07/22/2022    No results found for: "HGBA1C"   Lab Results  Component Value Date   TSH 2.70 07/22/2022        Assessment & Plan:   Upcoming international travel - On Thursday she will be travelling to [place] for 13 days. Will prescribe Xanax 0.5 mg to take as needed for sleep/anxiety, Zofran 4 mg to take as needed for nausea. We will also provide a  Zithromax Z-pak. Offered medrol dosepak, which she declines. She recently had a spinal  steroid injection for back pain management given that she will travel by  airplane.  Plan:  Medications prescribed as above. She will call us if she is in need of any other medications.   I,Mathew Stumpf,acting as a Neurosurgeon for Margaree Mackintosh, MD.,have documented all relevant documentation on the behalf of Margaree Mackintosh, MD,as directed by  Margaree Mackintosh, MD while in the presence of Margaree Mackintosh, MD.   I, Margaree Mackintosh, MD, have reviewed all documentation for this visit. The documentation on 06/06/23 for the exam, diagnosis, procedures, and orders are all accurate and complete.

## 2023-05-20 NOTE — Telephone Encounter (Signed)
Robin Chaney [phone]  Joslin called to see if you would call her in some of the  below medication, she is leaving Thursday to go to [place] for a couple of weeks and would like to have some to get her thru the trip. I did tell her since she has not had this medication since 10/2021, she would need some type of office visit.

## 2023-06-05 ENCOUNTER — Telehealth: Payer: Self-pay | Admitting: Allergy

## 2023-06-05 MED ORDER — BUDESONIDE-FORMOTEROL FUMARATE 160-4.5 MCG/ACT IN AERO: 2.0000 | INHALATION_SPRAY | Freq: Two times a day (BID) | RESPIRATORY_TRACT | 0 refills | Status: DC

## 2023-06-05 NOTE — Telephone Encounter (Signed)
Patient called stating she is needing a refill on her inhaler as she is almost out of her inhaler. [facility]

## 2023-06-05 NOTE — Telephone Encounter (Signed)
Spoke with pt she needed [facility]ort [facility] I have sent in refill enough to get her to her next appt

## 2023-06-05 NOTE — Telephone Encounter (Signed)
I called patient and left a message to call the office to clarify which inhaler she needs a refill on.

## 2023-06-06 NOTE — Patient Instructions (Signed)
It was a pleasure to see you today.  We have prescribed Xanax to take sparingly for sleep/anxiety.  Zofran to take sparingly for nausea.  We have also provided you with a Zithromax Z-PAK should you develop a respiratory infection.  This will not treat a urinary tract infection.  We hope you have a great time on your trip to [place].

## 2023-06-10 NOTE — Progress Notes (Signed)
Follow Up Note  RE: Robin Chaney MRN: [id number] DOB: 08-09-1951 Date of Office Visit: 06/11/2023  Referring provider: Margaree Mackintosh, MD Primary care provider: Margaree Mackintosh, MD  Chief Complaint: Recurrent UIR, Asthma, and Follow-up  History of Present Illness: I had the pleasure of seeing Robin Chaney for a follow up visit at the [facility] on 06/11/2023. She is a 72 y.o. female, who is being followed for asthma, allergic rhinoconjunctivitis, recurrent infections. Her previous allergy office visit was on 10/11/2022 with Dr. Selena Batten. Today is a regular follow up visit. Failed to follow up as recommended.   Asthma  Patient has been having issues with her asthma when it got hot. Then it got worse when she went to [place] last month. She just got back last week. She was only taking Symbicort 2 puffs once a day for 3-4 months and recently started to take it twice a day.   She is also using albuterol every few days with some benefit.  Currently complaining of some coughing, wheezing.   Patient had zpak for recent URI which helped.  Patient had steroid injections and oral prednisone since last visit due to her back pain.   Seasonal and perennial allergic rhinoconjunctivitis Currently taking Singulair, allegra daily. Not taking nasal sprays daily.  Recurrent infections Just finished zpak  Assessment and Plan: Robin Chaney is a 72 y.o. female with: Not well controlled moderate persistent asthma Past history - 2023 CXR showed peribronchial thickening. Using albuterol once a day at least. Used Symbicort for 1 week then stopped as she was concerned if vaginal bleeding was caused by this inhaler. Covid-19 infection 1 year ago. 2023 spirometry showed: possible restrictive disease with 6% improvement in FEV1 post bronchodilator treatment. Clinically feeling improved. No coughing post treatment.  Interim history - flared with hot weather and recent trip to  [place]. Today's spirometry was normal. For the next 1-2 weeks - use Breztri 2 puffs twice a day with spacer and rinse mouth afterwards. Samples given. Daily controller medication(s): resume Symbicort 2 puffs twice a day with spacer and rinse mouth afterwards once feeling better.  Continue Singulair (montelukast) 10mg  daily at night. During respiratory infections/flares:  Use Breztri 2 puffs twice a day with spacer and rinse mouth afterwards for 1-2 weeks until your breathing symptoms return to baseline.  Stop Symbicort when using Breztri. May use albuterol rescue inhaler 2 puffs every 4 to 6 hours as needed for shortness of breath, chest tightness, coughing, and wheezing. May use albuterol rescue inhaler 2 puffs 5 to 15 minutes prior to strenuous physical activities. Monitor frequency of use - if you need to use it more than twice per week on a consistent basis let us know.  Consider adding on biologics.  Seasonal allergic rhinitis due to pollen Allergic rhinitis due to animal dander Allergic rhinitis due to mold Allergic conjunctivitis of both eyes Past history - Used to be on AIT for 1 year with good benefit. 1 dog at home. 2023 bloodwork was to dog, grass pollen and mold.  Interim history - stable.  Use over the counter antihistamines such as Zyrtec (cetirizine), Claritin (loratadine), Allegra (fexofenadine), or Xyzal (levocetirizine) daily as needed. May take twice a day during allergy flares. May switch antihistamines every few months. May use Ryaltris (olopatadine + mometasone nasal spray combination) 1-2 sprays per nostril twice a day.  Nasal saline spray (i.e., Simply Saline) or nasal saline lavage (i.e., NeilMed) is recommended as needed and  prior to medicated nasal sprays. May use Refresh re-wetting eye drops.   Recurrent infections Past history - Frequent bronchitis episodes. Up to date with flu, pneumonia and Covid-19 vaccines. 2023 bloodwork showed normal immunoglobulin  levels, good protection against diptheria, tetanus and pneumoniae.  Interim history - finished zpak. Keep track of infections and antibiotics use. Get CBC diff recheck at next visit.   Elevated blood pressure reading Monitor symptoms and follow up with PCP.   Return in about 2 months (around 08/11/2023).  No orders of the defined types were placed in this encounter.  Lab Orders  No laboratory test(s) ordered today    Diagnostics: Spirometry:  Tracings reviewed. Her effort: Good reproducible efforts. FVC: 2.00L FEV1: 1.57L, 73% predicted FEV1/FVC ratio: 79% Interpretation: Spirometry consistent with normal pattern.  Please see scanned spirometry results for details.  Medication List:  Current Outpatient Medications  Medication Sig Dispense Refill   albuterol (VENTOLIN HFA) 108 (90 Base) MCG/ACT inhaler Inhale 2 puffs into the lungs every 6 (six) hours as needed for wheezing or shortness of breath. 54 g 3   budesonide-formoterol (SYMBICORT) 160-4.5 MCG/ACT inhaler Inhale 2 puffs into the lungs in the morning and at bedtime. Use with spacer and rinse mouth afterwards. 30.6 g 0   Cholecalciferol (VITAMIN D3 PO) Take 1,000 Units by mouth daily.      escitalopram (LEXAPRO) 20 MG tablet Take 1 tablet (20 mg total) by mouth daily. 90 tablet 3   fexofenadine (ALLEGRA) 180 MG tablet Take 180 mg by mouth daily as needed for allergies.     ibuprofen (ADVIL) 200 MG tablet Take 200 mg by mouth every 8 (eight) hours as needed.     montelukast (SINGULAIR) 10 MG tablet Take 1 tablet (10 mg total) by mouth at bedtime. 90 tablet 3   rosuvastatin (CRESTOR) 5 MG tablet Take 1 tablet (5 mg total) by mouth daily. 90 tablet 3   tretinoin (RETIN-A) 0.1 % cream Apply to affected areas at bedtime as directed. 45 g 1   zolpidem (AMBIEN) 10 MG tablet Take 1 tablet (10 mg total) by mouth at bedtime. 90 tablet 1   ALPRAZolam (XANAX) 0.5 MG tablet Take 1 tablet (0.5 mg total) by mouth 2 (two) times daily as  needed for anxiety. (Patient not taking: Reported on 06/11/2023) 30 tablet 0   No current facility-administered medications for this visit.   Allergies: Allergies  Allergen Reactions   Codeine Nausea Only   I reviewed her past medical history, social history, family history, and environmental history and no significant changes have been reported from her previous visit.  Review of Systems  Constitutional:  Negative for appetite change, chills, fever and unexpected weight change.  HENT:  Negative for congestion and rhinorrhea.   Eyes:  Negative for itching.  Respiratory:  Positive for cough, chest tightness, shortness of breath and wheezing.   Cardiovascular:  Negative for chest pain.  Gastrointestinal:  Negative for abdominal pain.  Genitourinary:  Negative for difficulty urinating.  Skin:  Negative for rash.  Allergic/Immunologic: Positive for environmental allergies.    Objective: BP (!) 154/78   Pulse 70   Temp 97.8 F (36.6 C) (Temporal)   Resp 16   Wt 150 lb (68 kg)   SpO2 97%   BMI 25.75 kg/m  Body mass index is 25.75 kg/m. Physical Exam Vitals and nursing note reviewed.  Constitutional:      Appearance: Normal appearance. She is well-developed.  HENT:     Head: Normocephalic and atraumatic.  Right Ear: Tympanic membrane and external ear normal.     Left Ear: Tympanic membrane and external ear normal.     Nose: Nose normal.     Mouth/Throat:     Mouth: Mucous membranes are moist.     Pharynx: Oropharynx is clear.  Eyes:     Conjunctiva/sclera: Conjunctivae normal.  Cardiovascular:     Rate and Rhythm: Normal rate and regular rhythm.     Heart sounds: Normal heart sounds. No murmur heard.    No friction rub. No gallop.  Pulmonary:     Effort: Pulmonary effort is normal.     Breath sounds: Normal breath sounds. No wheezing, rhonchi or rales.  Musculoskeletal:     Cervical back: Neck supple.  Skin:    General: Skin is warm.     Findings: No rash.   Neurological:     Mental Status: She is alert and oriented to person, place, and time.  Psychiatric:        Behavior: Behavior normal.    Previous notes and tests were reviewed. The plan was reviewed with the patient/family, and all questions/concerned were addressed.  It was my pleasure to see Robin Chaney today and participate in her care. Please feel free to contact me with any questions or concerns.  Sincerely,  Wyline Mood, DO Allergy & Immunology  [facility] office: [phone] [facility] office: [phone]

## 2023-06-11 ENCOUNTER — Other Ambulatory Visit: Payer: Self-pay

## 2023-06-11 ENCOUNTER — Encounter: Payer: Self-pay | Admitting: Allergy

## 2023-06-11 ENCOUNTER — Ambulatory Visit: Payer: Medicare Other | Admitting: Allergy

## 2023-06-11 VITALS — BP 154/78 | HR 70 | Temp 97.8°F | Resp 16 | Wt 150.0 lb

## 2023-06-11 DIAGNOSIS — J3089 Other allergic rhinitis: Secondary | ICD-10-CM

## 2023-06-11 DIAGNOSIS — J4540 Moderate persistent asthma, uncomplicated: Secondary | ICD-10-CM | POA: Diagnosis not present

## 2023-06-11 DIAGNOSIS — B999 Unspecified infectious disease: Secondary | ICD-10-CM

## 2023-06-11 DIAGNOSIS — R030 Elevated blood-pressure reading, without diagnosis of hypertension: Secondary | ICD-10-CM

## 2023-06-11 DIAGNOSIS — J3081 Allergic rhinitis due to animal (cat) (dog) hair and dander: Secondary | ICD-10-CM | POA: Diagnosis not present

## 2023-06-11 DIAGNOSIS — J301 Allergic rhinitis due to pollen: Secondary | ICD-10-CM

## 2023-06-11 DIAGNOSIS — H1013 Acute atopic conjunctivitis, bilateral: Secondary | ICD-10-CM

## 2023-06-11 NOTE — Patient Instructions (Addendum)
Breathing  For the next 1-2 weeks:  Use Breztri 2 puffs twice a day with spacer and rinse mouth afterwards. Samples given. Daily controller medication(s): resume Symbicort 2 puffs twice a day with spacer and rinse mouth afterwards once feeling better.  Continue Singulair (montelukast) 10mg  daily at night. During respiratory infections/flares:  Use Breztri 2 puffs twice a day with spacer and rinse mouth afterwards for 1-2 weeks until your breathing symptoms return to baseline.  Stop Symbicort when using Breztri. May use albuterol rescue inhaler 2 puffs every 4 to 6 hours as needed for shortness of breath, chest tightness, coughing, and wheezing. May use albuterol rescue inhaler 2 puffs 5 to 15 minutes prior to strenuous physical activities. Monitor frequency of use - if you need to use it more than twice per week on a consistent basis let us know.  Breathing control goals:  Full participation in all desired activities (may need albuterol before activity) Albuterol use two times or less a week on average (not counting use with activity) Cough interfering with sleep two times or less a month Oral steroids no more than once a year No hospitalizations   Environmental allergies 2023 bloodwork was to dog, grass pollen and mold.  Use over the counter antihistamines such as Zyrtec (cetirizine), Claritin (loratadine), Allegra (fexofenadine), or Xyzal (levocetirizine) daily as needed. May take twice a day during allergy flares. May switch antihistamines every few months. May use Ryaltris (olopatadine + mometasone nasal spray combination) 1-2 sprays per nostril twice a day.  Nasal saline spray (i.e., Simply Saline) or nasal saline lavage (i.e., NeilMed) is recommended as needed and prior to medicated nasal sprays. May use Refresh re-wetting eye drops.   Recurrent infections Keep track of infections and antibiotics use. Get CBC diff recheck at next visit.   Elevated blood pressure Monitor  symptoms and follow up with PCP.   Follow up in 2 months or sooner if needed.   Pet Allergen Avoidance: Contrary to popular opinion, there are no "hypoallergenic" breeds of dogs or cats. That is because people are not allergic to an animal's hair, but to an allergen found in the animal's saliva, dander (dead skin flakes) or urine. Pet allergy symptoms typically occur within minutes. For some people, symptoms can build up and become most severe 8 to 12 hours after contact with the animal. People with severe allergies can experience reactions in public places if dander has been transported on the pet owners' clothing. Keeping an animal outdoors is only a partial solution, since homes with pets in the yard still have higher concentrations of animal allergens. Before getting a pet, ask your allergist to determine if you are allergic to animals. If your pet is already considered part of your family, try to minimize contact and keep the pet out of the bedroom and other rooms where you spend a great deal of time. As with dust mites, vacuum carpets often or replace carpet with a hardwood floor, tile or linoleum. High-efficiency particulate air (HEPA) cleaners can reduce allergen levels over time. While dander and saliva are the source of cat and dog allergens, urine is the source of allergens from rabbits, hamsters, mice and [place] pigs; so ask a non-allergic family member to clean the animal's cage. If you have a pet allergy, talk to your allergist about the potential for allergy immunotherapy (allergy shots). This strategy can often provide long-term relief. Reducing Pollen Exposure Pollen seasons: trees (spring), grass (summer) and ragweed/weeds (fall). Keep windows closed in your home  and car to lower pollen exposure.  Install air conditioning in the bedroom and throughout the house if possible.  Avoid going out in dry windy days - especially early morning. Pollen counts are highest between 5 - 10 AM  and on dry, hot and windy days.  Save outside activities for late afternoon or after a heavy rain, when pollen levels are lower.  Avoid mowing of grass if you have grass pollen allergy. Be aware that pollen can also be transported indoors on people and pets.  Dry your clothes in an automatic dryer rather than hanging them outside where they might collect pollen.  Rinse hair and eyes before bedtime. Mold Control Mold and fungi can grow on a variety of surfaces provided certain temperature and moisture conditions exist.  Outdoor molds grow on plants, decaying vegetation and soil. The major outdoor mold, Alternaria and Cladosporium, are found in very high numbers during hot and dry conditions. Generally, a late summer - fall peak is seen for common outdoor fungal spores. Rain will temporarily lower outdoor mold spore count, but counts rise rapidly when the rainy period ends. The most important indoor molds are Aspergillus and Penicillium. Dark, humid and poorly ventilated basements are ideal sites for mold growth. The next most common sites of mold growth are the bathroom and the kitchen. Outdoor (Seasonal) Mold Control Use air conditioning and keep windows closed. Avoid exposure to decaying vegetation. Avoid leaf raking. Avoid grain handling. Consider wearing a face mask if working in moldy areas.  Indoor (Perennial) Mold Control  Maintain humidity below 50%. Get rid of mold growth on hard surfaces with water, detergent and, if necessary, 5% bleach (do not mix with other cleaners). Then dry the area completely. If mold covers an area more than 10 square feet, consider hiring an indoor environmental professional. For clothing, washing with soap and water is best. If moldy items cannot be cleaned and dried, throw them away. Remove sources e.g. contaminated carpets. Repair and seal leaking roofs or pipes. Using dehumidifiers in damp basements may be helpful, but empty the water and clean units  regularly to prevent mildew from forming. All rooms, especially basements, bathrooms and kitchens, require ventilation and cleaning to deter mold and mildew growth. Avoid carpeting on concrete or damp floors, and storing items in damp areas.

## 2023-07-31 ENCOUNTER — Ambulatory Visit: Payer: Medicare Other

## 2023-08-01 ENCOUNTER — Ambulatory Visit (INDEPENDENT_AMBULATORY_CARE_PROVIDER_SITE_OTHER): Payer: Medicare Other

## 2023-08-01 VITALS — BP 120/80 | HR 72 | Ht 64.0 in | Wt 150.0 lb

## 2023-08-01 DIAGNOSIS — Z23 Encounter for immunization: Secondary | ICD-10-CM

## 2023-08-01 NOTE — Progress Notes (Signed)
Patient is here for a flu vaccine. Patient tolerated well.

## 2023-08-04 ENCOUNTER — Ambulatory Visit (INDEPENDENT_AMBULATORY_CARE_PROVIDER_SITE_OTHER): Payer: Medicare Other | Admitting: Internal Medicine

## 2023-08-04 ENCOUNTER — Telehealth: Payer: Self-pay | Admitting: Internal Medicine

## 2023-08-04 ENCOUNTER — Other Ambulatory Visit (HOSPITAL_COMMUNITY): Payer: Self-pay

## 2023-08-04 ENCOUNTER — Encounter: Payer: Self-pay | Admitting: Internal Medicine

## 2023-08-04 VITALS — BP 110/60 | HR 78 | Ht 64.0 in | Wt 158.0 lb

## 2023-08-04 DIAGNOSIS — J4520 Mild intermittent asthma, uncomplicated: Secondary | ICD-10-CM | POA: Diagnosis not present

## 2023-08-04 DIAGNOSIS — M5416 Radiculopathy, lumbar region: Secondary | ICD-10-CM | POA: Diagnosis not present

## 2023-08-04 MED ORDER — PREDNISONE 10 MG PO TABS
ORAL_TABLET | ORAL | 1 refills | Status: DC
  Filled 2023-08-04: qty 21, 6d supply, fill #0
  Filled 2023-08-20: qty 21, 6d supply, fill #1

## 2023-08-04 NOTE — Progress Notes (Signed)
Patient Care Team: Margaree Mackintosh, MD as PCP - General  Visit Date: 08/04/23  Subjective:    Patient ID: Robin Chaney , Female   DOB: [birth year], 72 y.o.    MRN: [id number]   72 y.o. Female presents today for increased back pain, left shoulder pain recently. Back pain is constant. Reports she has an appointment with neurosurgeon, Dr. Danielle Dess in 08/2023. She is taking NSAIDs. She has tried acupuncture, heat therapy, stretching without relief. She has taken Celebrex in the past. 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 Scoliosis shown on X-rays. Simple decompression recommended. Fusion needed to stabilize spine.  She has had a cough recently and believes it is allergy related. History of allergic rhinitis, asthma treated with Symbicort inhaler, Singulair.  Past Medical History:  Diagnosis Date   Allergic conjunctivitis    Allergic rhinitis    Asthma    Recurrent upper respiratory infection (URI)      Family History  Problem Relation Age of Onset   Emphysema Other        GF   Allergies Mother    Asthma Mother    Rheum arthritis Mother    Breast cancer Mother    Heart disease Mother    Allergies Sister    Lung cancer Father        was a smoker   Heart disease Father     Social Hx: single, retired Astronomer., resides alone. Nonsmoker.     Review of Systems  Constitutional:  Negative for fever and malaise/fatigue.  HENT:  Negative for congestion.   Eyes:  Negative for blurred vision.  Respiratory:  Positive for cough. Negative for shortness of breath.   Cardiovascular:  Negative for chest pain, palpitations and leg swelling.  Gastrointestinal:  Negative for vomiting.  Musculoskeletal:  Positive for back pain and joint pain (Left shoulder).  Skin:  Negative for rash.  Neurological:  Negative for loss of consciousness and headaches.        Objective:   Vitals: BP 110/60   Pulse 78   Ht 5\' 4"  (1.626  m)   Wt 158 lb (71.7 kg)   SpO2 98%   BMI 27.12 kg/m    Physical Exam Vitals and nursing note reviewed.  Constitutional:      General: She is not in acute distress.    Appearance: Normal appearance. She is not toxic-appearing.  HENT:     Head: Normocephalic and atraumatic.  Pulmonary:     Effort: Pulmonary effort is normal.  Musculoskeletal:     Comments: Right straight leg raise test more positive than left. 5/5 muscle strength in all groups tested. Pain on abduction of left shoulder.  Skin:    General: Skin is warm and dry.  Neurological:     Mental Status: She is alert and oriented to person, place, and time. Mental status is at baseline.     Deep Tendon Reflexes: Reflexes are normal and symmetric.     Reflex Scores:      Bicep reflexes are 2+ on the right side and 2+ on the left side.      Brachioradialis reflexes are 2+ on the right side and 2+ on the left side.      Patellar reflexes are 2+ on the right side and 2+ on the left side. Psychiatric:        Mood and Affect: Mood normal.  Behavior: Behavior normal.        Thought Content: Thought content normal.        Judgment: Judgment normal.       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   Scoliosis shown on X-rays.  Labs:       Component Value Date/Time   NA 139 07/22/2022 0945   K 4.6 07/22/2022 0945   CL 106 07/22/2022 0945   CO2 24 07/22/2022 0945   GLUCOSE 85 07/22/2022 0945   BUN 16 07/22/2022 0945   CREATININE 0.51 (L) 07/22/2022 0945   CALCIUM 8.7 07/22/2022 0945   PROT 6.6 07/22/2022 0945   ALBUMIN 3.8 05/17/2016 1131   AST 16 07/22/2022 0945   ALT 12 07/22/2022 0945   ALKPHOS 61 05/17/2016 1131   BILITOT 0.4 07/22/2022 0945   GFRNONAA 94 12/03/2019 0912   GFRAA 109 12/03/2019 0912     Lab Results  Component Value Date   WBC 3.0 (L) 07/22/2022   HGB 13.6  07/22/2022   HCT 40.1 07/22/2022   MCV 92.6 07/22/2022   PLT 292 07/22/2022    Lab Results  Component Value Date   CHOL 172 07/22/2022   HDL 52 07/22/2022   LDLCALC 78 07/22/2022   TRIG 349 (H) 07/22/2022   CHOLHDL 3.3 07/22/2022    No results found for: "HGBA1C"   Lab Results  Component Value Date   TSH 2.70 07/22/2022      Assessment & Plan:   Musculoskeletal pain: prescribed prednisone tapering course take as directed with one refill. She has an upcoming appointment with Dr. Danielle Dess. Consider referral to orthopedist for left shoulder pain if no improvement. Prescribed prednisone 10 mg (number 21 tablets) to start with 6 tablets on day 1 and decrease by 1 tablet daily i.e. 6 days 5-4-3-2-1 taper.  Mild reactive airways disease-prednisone should help this as well  I,Alexander Ruley,acting as a scribe for Margaree Mackintosh, MD.,have documented all relevant documentation on the behalf of Margaree Mackintosh, MD,as directed by  Margaree Mackintosh, MD while in the presence of Margaree Mackintosh, MD.   I, Margaree Mackintosh, MD, have reviewed all documentation for this visit. The documentation on 08/05/23 for the exam, diagnosis, procedures, and orders are all accurate and complete.

## 2023-08-04 NOTE — Telephone Encounter (Signed)
Robin Chaney [phone]  Robin Chaney would like to get a dose pack, she is hurting all over, she says every joint in her body hurts, knees, hands, back shoulder. I let her know she would need to come in for an appointment first and she said that was fine but she goes to work at a little job at 4:00 today and tomorrow.Marland Kitchen

## 2023-08-04 NOTE — Telephone Encounter (Signed)
scheduled

## 2023-08-05 ENCOUNTER — Other Ambulatory Visit (HOSPITAL_COMMUNITY): Payer: Self-pay

## 2023-08-05 ENCOUNTER — Other Ambulatory Visit: Payer: Self-pay | Admitting: Internal Medicine

## 2023-08-05 NOTE — Patient Instructions (Signed)
Patient seen today requesting prednisone tapering course for lumbar radiculopathy.  She has upcoming appointment with Dr. Danielle Dess.  Patient also having left shoulder pain and may need to see orthopedist.  Prescribed prednisone 10 mg (# 21 tablets) to take 6 tablets on day 1 and decrease by 1 tablet daily i.e. 6-5-4-3-2-1 taper.

## 2023-08-06 ENCOUNTER — Ambulatory Visit (INDEPENDENT_AMBULATORY_CARE_PROVIDER_SITE_OTHER): Payer: Medicare Other | Admitting: Internal Medicine

## 2023-08-06 ENCOUNTER — Encounter: Payer: Self-pay | Admitting: Internal Medicine

## 2023-08-06 ENCOUNTER — Other Ambulatory Visit (HOSPITAL_COMMUNITY): Payer: Self-pay

## 2023-08-06 ENCOUNTER — Telehealth: Payer: Self-pay | Admitting: Internal Medicine

## 2023-08-06 VITALS — BP 120/70 | HR 74 | Temp 97.9°F | Ht 64.0 in | Wt 157.0 lb

## 2023-08-06 DIAGNOSIS — J45901 Unspecified asthma with (acute) exacerbation: Secondary | ICD-10-CM | POA: Diagnosis not present

## 2023-08-06 DIAGNOSIS — M5416 Radiculopathy, lumbar region: Secondary | ICD-10-CM

## 2023-08-06 DIAGNOSIS — R059 Cough, unspecified: Secondary | ICD-10-CM

## 2023-08-06 DIAGNOSIS — H6592 Unspecified nonsuppurative otitis media, left ear: Secondary | ICD-10-CM | POA: Diagnosis not present

## 2023-08-06 MED ORDER — AZITHROMYCIN 250 MG PO TABS
ORAL_TABLET | ORAL | 1 refills | Status: AC
  Filled 2023-08-06: qty 6, 5d supply, fill #0

## 2023-08-06 MED ORDER — HYDROCODONE BIT-HOMATROP MBR 5-1.5 MG/5ML PO SOLN
5.0000 mL | Freq: Three times a day (TID) | ORAL | 0 refills | Status: DC | PRN
  Filled 2023-08-06: qty 120, 8d supply, fill #0

## 2023-08-06 NOTE — Patient Instructions (Signed)
I believe you are having an exacerbation of allergic bronchitis with possible secondary infection suspected.  You also have a left serous otitis media.  You are currently on steroids orally for a short course acute right lumbar radiculopathy symptoms.  Please take Zithromax Z-PAK 2 tabs day 1 followed by 1 tab days 2 through 5 and finish oral steroid course as prescribed for right lumbar radiculopathy which should also help your respiratory symptoms.  Continue Breztri prescribed by allergist for this acute exacerbation and Symbicort for maintenance therapy.  You are also supposed to be taking Singulair and Allegra.  Ventolin inhaler is to be used as a rescue inhaler if you have acute shortness of breath.

## 2023-08-06 NOTE — Telephone Encounter (Signed)
Robin Chaney [phone]  [name] just called to say her cough has gotten worse since she was here and her chest hurts when she coughs, no other symptoms. She done a COVID test last night and it was negative. She is wandering since she was here on Monday and had a cough if you would send her something in with out her having to come back in.

## 2023-08-06 NOTE — Telephone Encounter (Signed)
scheduled

## 2023-08-06 NOTE — Progress Notes (Signed)
Subjective:    Patient ID: Robin Chaney, female    DOB: 21-Nov-1950, 72 y.o.   MRN: [id number]  HPI 72 year old Female presents with cough. No discolored sputum. Has hx of moderate persistent asthma seen by Dr. Murrell Chaney. Last seen by Dr. Murrell Chaney in early September. CBC with diff suggested by Dr. Murrell Chaney but we did not draw this today. Consider doing this if not improving as well as CXR. Patient having considerable issues with right lumbar radiculopathy as well.   Seen here on October 28th for right lumbar radiculopathy and course of oral steroids prescribed.   Dr.  Murrell Chaney notes pt was taking Symbicort only once a day and advised pt to take it twice a day. Also takes Singulair and Allegra daily. Not using nasal sprays. CXR in 2023 showed peribronchial thickening. Spirometery in 2023 showed possible restrictive disease with 6% improvement in FEV1 post bronchodilator treatment. Pt feels symptoms may have worsened with recent trip to [place]. During flare ups Robin Chaney has been recommended by Allergist. Pt is not to use Symbicort when using Breztri. Can use albuterol if needed for cough, wheezing or SOB and if needed more than twice a week is to let Allergist know. Other options are available if this does not work.Has allergies to pollen, animal dander, and mold.Can use OTC antihistamines and can switch brands every few months. Can use saline lavage.Has normal immunoglobulin levels.    Review of Systems see above, lives in older home. Has one pet     Objective:   Physical Exam  VS reviewed. She is afebrile. No tachypnea noted. No audible wheezing  BP normal today at 120/70 pulse ox 96% pulse 74 and regular   Chest is clear without rales or wheezing.Pharynx clear.Left TM slightly full but not red. Right TM clear.    Assessment & Plan:   Exacerbation of allergic bronchitis with secondary infection suspected Left serous otitis media Right lumbar radiculopathy currently on steroids for short  course  Plan:Zithromax Z pak prescribed. Already on oral steroids for right lumbar radiculopathy.Steroids should help respiratory symptoms.Continue Breztri for acute exacerbation and Symbicort for maintenance. Is also supposed to be on Singulair and Allegra. Has Ventolin inhaler.

## 2023-08-07 ENCOUNTER — Other Ambulatory Visit (HOSPITAL_COMMUNITY): Payer: Self-pay

## 2023-08-19 ENCOUNTER — Encounter: Payer: Self-pay | Admitting: Internal Medicine

## 2023-08-21 ENCOUNTER — Other Ambulatory Visit (HOSPITAL_COMMUNITY): Payer: Self-pay

## 2023-08-27 DIAGNOSIS — Z6826 Body mass index (BMI) 26.0-26.9, adult: Secondary | ICD-10-CM | POA: Diagnosis not present

## 2023-08-27 DIAGNOSIS — M5442 Lumbago with sciatica, left side: Secondary | ICD-10-CM | POA: Diagnosis not present

## 2023-09-15 NOTE — Progress Notes (Signed)
Annual Wellness Visit    Patient Care Team: Margaree Mackintosh, MD as PCP - General  Visit Date: 09/22/23   Chief Complaint  Patient presents with   Annual Exam   Medicare Wellness    Subjective:   Patient: Robin Chaney, Female    DOB: 1951-06-30, 72 y.o.   MRN: [id number]  Robin Chaney is a 72 y.o. Female who presents today for her Annual Wellness Visit. History of allergic conjunctivitis, allergic rhinitis, asthma, recurrent upper respiratory infection.   History of anxiety depression treated with Lexapro 20 mg daily, alprazolam 0.5 mg twice daily as needed.  Longstanding history of insomnia treated with Ambien.  History of allergic rhinitis treated with Allegra, Singulair.  Has Ventolin inhaler on hand as well as Symbicort inhaler.   History of chronic back pain. She is considering spinal surgery. She has received epidural injections in the past. Had a recent epidural but is having pain again that radiates down leg. Has tried a little bit of physical therapy but this has not helped her back pain.  She had an episode in January 2017 with right lower back pain radiating into right leg.  This was treated with a course of prednisone and improved.  She is interested in trying weight loss medication. She is having difficulty exercising due to back pain. She is eating eggs with toast for breakfast, skips lunch, wine with dinner, few cookies for dessert.  History of elevated cholesterol treated with rosuvastatin 5 mg daily. TRIG elevated at 156.   History of bilateral breast implants.  History of GE reflux.  09/19/23 labs reviewed today. Glucose normal. Kidney, liver functions normal. Electrolytes normal. Blood proteins normal. WBC low at 3,100. Absolute neutrophils low at 896. TSH normal.   Mammogram 12/27/22 showed: 1) stable mass in the right breast at 10 o'clock, likely a complicated cyst. Given stability for 2 years is considered benign, 2) Stable to slight decrease  in size focal fibrocystic changes in the right breast at 1 o'clock. Given stability for 2 years is considered benign, 3) No mammographic evidence of malignancy bilaterally.  Had colonoscopy in 2018.  5-year follow-up was recommended.  She needs to call [facility].    Social history: She is retired.  Previously worked as an Charity fundraiser at [employer].  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  She travels.   Past Medical History:  Diagnosis Date   Allergic conjunctivitis    Allergic rhinitis    Asthma    Recurrent upper respiratory infection (URI)      Family History  Problem Relation Age of Onset   Emphysema Other        GF   Allergies Mother    Asthma Mother    Rheum arthritis Mother    Breast cancer Mother    Heart disease Mother    Allergies Sister    Lung cancer Father        was a smoker   Heart disease Father      Social Hx: Single and resides alone. Retired Astronomer.    Review of Systems  Constitutional:  Negative for chills, fever, malaise/fatigue and weight loss.  HENT:  Negative for hearing loss, sinus pain and sore throat.   Respiratory:  Negative for cough, hemoptysis and shortness of breath.   Cardiovascular:  Negative for chest pain, palpitations, leg swelling and PND.  Gastrointestinal:  Negative for abdominal pain, constipation, diarrhea,  heartburn, nausea and vomiting.  Genitourinary:  Negative for dysuria, frequency and urgency.  Musculoskeletal:  Positive for back pain. Negative for myalgias and neck pain.  Skin:  Negative for itching and rash.  Neurological:  Negative for dizziness, tingling, seizures and headaches.  Endo/Heme/Allergies:  Negative for polydipsia.  Psychiatric/Behavioral:  Negative for depression. The patient is not nervous/anxious.       Objective:   Vitals: BP (!) 140/100   Pulse 64   Ht 5\' 4"  (1.626 m)   Wt 155 lb (70.3 kg)   SpO2 97%   BMI 26.61 kg/m   Physical Exam Vitals and nursing note  reviewed.  Constitutional:      General: She is not in acute distress.    Appearance: Normal appearance. She is not ill-appearing or toxic-appearing.  HENT:     Head: Normocephalic and atraumatic.     Right Ear: Hearing, tympanic membrane, ear canal and external ear normal.     Left Ear: Hearing, tympanic membrane, ear canal and external ear normal.     Mouth/Throat:     Pharynx: Oropharynx is clear.  Eyes:     Extraocular Movements: Extraocular movements intact.     Pupils: Pupils are equal, round, and reactive to light.  Neck:     Thyroid: No thyroid mass, thyromegaly or thyroid tenderness.     Vascular: No carotid bruit.  Cardiovascular:     Rate and Rhythm: Normal rate and regular rhythm. No extrasystoles are present.    Pulses:          Dorsalis pedis pulses are 1+ on the right side and 1+ on the left side.     Heart sounds: Normal heart sounds. No murmur heard.    No friction rub. No gallop.  Pulmonary:     Effort: Pulmonary effort is normal.     Breath sounds: Normal breath sounds. No decreased breath sounds, wheezing, rhonchi or rales.  Chest:     Chest wall: No mass.  Abdominal:     Palpations: Abdomen is soft. There is no hepatomegaly, splenomegaly or mass.     Tenderness: There is no abdominal tenderness.     Hernia: No hernia is present.  Musculoskeletal:     Cervical back: Normal range of motion.     Right lower leg: No edema.     Left lower leg: No edema.  Lymphadenopathy:     Cervical: No cervical adenopathy.     Upper Body:     Right upper body: No supraclavicular adenopathy.     Left upper body: No supraclavicular adenopathy.  Skin:    General: Skin is warm and dry.  Neurological:     General: No focal deficit present.     Mental Status: She is alert and oriented to person, place, and time. Mental status is at baseline.     Sensory: Sensation is intact.     Motor: Motor function is intact. No weakness.     Deep Tendon Reflexes: Reflexes are normal and  symmetric.  Psychiatric:        Attention and Perception: Attention normal.        Mood and Affect: Mood normal.        Speech: Speech normal.        Behavior: Behavior normal.        Thought Content: Thought content normal.        Cognition and Memory: Cognition normal.        Judgment: Judgment normal.  Most recent functional status assessment:    09/22/2023    9:24 AM  In your present state of health, do you have any difficulty performing the following activities:  Hearing? 0  Vision? 0  Difficulty concentrating or making decisions? 0  Walking or climbing stairs? 0  Dressing or bathing? 0  Doing errands, shopping? 0  Preparing Food and eating ? N  Using the Toilet? N  In the past six months, have you accidently leaked urine? N  Do you have problems with loss of bowel control? N  Managing your Medications? N  Managing your Finances? N  Housekeeping or managing your Housekeeping? N   Most recent fall risk assessment:    09/22/2023    9:24 AM  Fall Risk   Falls in the past year? 0  Number falls in past yr: 0  Injury with Fall? 0  Risk for fall due to : No Fall Risks  Follow up Education provided;Falls evaluation completed    Most recent depression screenings:    09/22/2023    9:31 AM 08/04/2023    2:46 PM  PHQ 2/9 Scores  PHQ - 2 Score 0 0   Most recent cognitive screening:    09/22/2023    9:32 AM  6CIT Screen  What Year? 0 points  What month? 0 points  What time? 0 points  Count back from 20 0 points  Months in reverse 0 points  Repeat phrase 0 points  Total Score 0 points     Results:   Studies obtained and personally reviewed by me:  Mammogram 12/27/22 showed: 1) stable mass in the right breast at 10 o'clock, likely a complicated cyst. Given stability for 2 years is considered benign, 2) Stable to slight decrease in size focal fibrocystic changes in the right breast at 1 o'clock. Given stability for 2 years is considered benign, 3) No  mammographic evidence of malignancy bilaterally.  Had colonoscopy in 2018.  5-year follow-up was recommended.  She needs to call [facility].    Labs:       Component Value Date/Time   NA 141 09/19/2023 0954   K 4.3 09/19/2023 0954   CL 108 09/19/2023 0954   CO2 25 09/19/2023 0954   GLUCOSE 99 09/19/2023 0954   BUN 25 09/19/2023 0954   CREATININE 0.63 09/19/2023 0954   CALCIUM 9.4 09/19/2023 0954   PROT 6.5 09/19/2023 0954   ALBUMIN 3.8 05/17/2016 1131   AST 19 09/19/2023 0954   ALT 13 09/19/2023 0954   ALKPHOS 61 05/17/2016 1131   BILITOT 0.3 09/19/2023 0954   GFRNONAA 94 12/03/2019 0912   GFRAA 109 12/03/2019 0912     Lab Results  Component Value Date   WBC 3.1 (L) 09/19/2023   HGB 13.2 09/19/2023   HCT 41.1 09/19/2023   MCV 94.7 09/19/2023   PLT 336 09/19/2023    Lab Results  Component Value Date   CHOL 178 09/19/2023   HDL 59 09/19/2023   LDLCALC 94 09/19/2023   TRIG 156 (H) 09/19/2023   CHOLHDL 3.0 09/19/2023    No results found for: "HGBA1C"   Lab Results  Component Value Date   TSH 3.00 09/19/2023    Assessment & Plan:   Anxiety and depression: stable with Lexapro 20 mg daily, alprazolam 0.5 mg twice daily as needed.   Insomnia: stable with Ambien 10 mg at bedtime.    Allergic rhinitis: treated with Allegra, Singulair.  Has Ventolin inhaler on hand as well as Symbicort  inhaler.   Chronic back pain: has had epidurals without continued relief. She is considering surgery.  Elevated cholesterol: treated with rosuvastatin 5 mg daily. TRIG elevated at 156.  Prescribed phentermine 15 mg daily for weight loss.#30 to take one daily. This is one time Rx to help jump start weight loss for her. Is depressed about weight gain. Cannot exercise as much due to back pain. Needs to watch diet.  Mammogram 12/27/22 showed: 1) stable mass in the right breast at 10 o'clock, likely a complicated cyst. Given stability for 2 years is considered benign, 2) Stable to slight  decrease in size focal fibrocystic changes in the right breast at 1 o'clock. Given stability for 2 years is considered benign, 3) No mammographic evidence of malignancy bilaterally.  Had colonoscopy in 2018.  5-year follow-up was recommended.  She needs to call [facility].    Urinalysis normal today.  Breast and pelvic exams deferred to GYN physician.  Vaccine counseling: UTD on flu vaccine.  Return in 1 year or as needed.     Annual wellness visit done today including the all of the following: Reviewed patient's Family Medical History Reviewed and updated list of patient's medical providers Assessment of cognitive impairment was done Assessed patient's functional ability Established a written schedule for health screening services Health Risk Assessent Completed and Reviewed  Discussed health benefits of physical activity, and encouraged her to engage in regular exercise appropriate for her age and condition.        I,Alexander Ruley,acting as a Neurosurgeon for Margaree Mackintosh, MD.,have documented all relevant documentation on the behalf of Margaree Mackintosh, MD,as directed by  Margaree Mackintosh, MD while in the presence of Margaree Mackintosh, MD.   I, Margaree Mackintosh, MD, have reviewed all documentation for this visit. The documentation on 10/03/23 for the exam, diagnosis, procedures, and orders are all accurate and complete.

## 2023-09-19 ENCOUNTER — Other Ambulatory Visit: Payer: Medicare Other

## 2023-09-19 DIAGNOSIS — E782 Mixed hyperlipidemia: Secondary | ICD-10-CM

## 2023-09-19 DIAGNOSIS — E781 Pure hyperglyceridemia: Secondary | ICD-10-CM | POA: Diagnosis not present

## 2023-09-19 DIAGNOSIS — J4520 Mild intermittent asthma, uncomplicated: Secondary | ICD-10-CM

## 2023-09-19 DIAGNOSIS — Z0000 Encounter for general adult medical examination without abnormal findings: Secondary | ICD-10-CM | POA: Diagnosis not present

## 2023-09-19 DIAGNOSIS — K219 Gastro-esophageal reflux disease without esophagitis: Secondary | ICD-10-CM

## 2023-09-20 LAB — CBC WITH DIFFERENTIAL/PLATELET
Absolute Lymphocytes: 1615 {cells}/uL (ref 850–3900)
Absolute Monocytes: 508 {cells}/uL (ref 200–950)
Basophils Absolute: 50 {cells}/uL (ref 0–200)
Basophils Relative: 1.6 %
Eosinophils Absolute: 31 {cells}/uL (ref 15–500)
Eosinophils Relative: 1 %
HCT: 41.1 % (ref 35.0–45.0)
Hemoglobin: 13.2 g/dL (ref 11.7–15.5)
MCH: 30.4 pg (ref 27.0–33.0)
MCHC: 32.1 g/dL (ref 32.0–36.0)
MCV: 94.7 fL (ref 80.0–100.0)
MPV: 9.8 fL (ref 7.5–12.5)
Monocytes Relative: 16.4 %
Neutro Abs: 896 {cells}/uL — ABNORMAL LOW (ref 1500–7800)
Neutrophils Relative %: 28.9 %
Platelets: 336 10*3/uL (ref 140–400)
RBC: 4.34 10*6/uL (ref 3.80–5.10)
RDW: 12.1 % (ref 11.0–15.0)
Total Lymphocyte: 52.1 %
WBC: 3.1 10*3/uL — ABNORMAL LOW (ref 3.8–10.8)

## 2023-09-20 LAB — COMPLETE METABOLIC PANEL WITH GFR
AG Ratio: 2 (calc) (ref 1.0–2.5)
ALT: 13 U/L (ref 6–29)
AST: 19 U/L (ref 10–35)
Albumin: 4.3 g/dL (ref 3.6–5.1)
Alkaline phosphatase (APISO): 82 U/L (ref 37–153)
BUN: 25 mg/dL (ref 7–25)
CO2: 25 mmol/L (ref 20–32)
Calcium: 9.4 mg/dL (ref 8.6–10.4)
Chloride: 108 mmol/L (ref 98–110)
Creat: 0.63 mg/dL (ref 0.60–1.00)
Globulin: 2.2 g/dL (ref 1.9–3.7)
Glucose, Bld: 99 mg/dL (ref 65–99)
Potassium: 4.3 mmol/L (ref 3.5–5.3)
Sodium: 141 mmol/L (ref 135–146)
Total Bilirubin: 0.3 mg/dL (ref 0.2–1.2)
Total Protein: 6.5 g/dL (ref 6.1–8.1)
eGFR: 94 mL/min/{1.73_m2} (ref >=60)

## 2023-09-20 LAB — LIPID PANEL
Cholesterol: 178 mg/dL (ref <200)
HDL: 59 mg/dL (ref >=50)
LDL Cholesterol (Calc): 94 mg/dL
Non-HDL Cholesterol (Calc): 119 mg/dL (ref <130)
Total CHOL/HDL Ratio: 3 (calc) (ref <5.0)
Triglycerides: 156 mg/dL — ABNORMAL HIGH (ref <150)

## 2023-09-20 LAB — TSH: TSH: 3 m[IU]/L (ref 0.40–4.50)

## 2023-09-22 ENCOUNTER — Ambulatory Visit (INDEPENDENT_AMBULATORY_CARE_PROVIDER_SITE_OTHER): Payer: Medicare Other | Admitting: Internal Medicine

## 2023-09-22 VITALS — BP 140/100 | HR 64 | Ht 64.0 in | Wt 155.0 lb

## 2023-09-22 DIAGNOSIS — E781 Pure hyperglyceridemia: Secondary | ICD-10-CM

## 2023-09-22 DIAGNOSIS — M549 Dorsalgia, unspecified: Secondary | ICD-10-CM

## 2023-09-22 DIAGNOSIS — G8929 Other chronic pain: Secondary | ICD-10-CM

## 2023-09-22 DIAGNOSIS — F419 Anxiety disorder, unspecified: Secondary | ICD-10-CM

## 2023-09-22 DIAGNOSIS — K219 Gastro-esophageal reflux disease without esophagitis: Secondary | ICD-10-CM | POA: Diagnosis not present

## 2023-09-22 DIAGNOSIS — Z0000 Encounter for general adult medical examination without abnormal findings: Secondary | ICD-10-CM | POA: Diagnosis not present

## 2023-09-22 DIAGNOSIS — Z8709 Personal history of other diseases of the respiratory system: Secondary | ICD-10-CM

## 2023-09-22 DIAGNOSIS — G4700 Insomnia, unspecified: Secondary | ICD-10-CM | POA: Diagnosis not present

## 2023-09-22 DIAGNOSIS — F32A Depression, unspecified: Secondary | ICD-10-CM

## 2023-09-22 DIAGNOSIS — R635 Abnormal weight gain: Secondary | ICD-10-CM

## 2023-09-22 DIAGNOSIS — J4520 Mild intermittent asthma, uncomplicated: Secondary | ICD-10-CM

## 2023-09-22 LAB — POCT URINALYSIS DIP (CLINITEK)
Bilirubin, UA: NEGATIVE
Blood, UA: NEGATIVE
Glucose, UA: NEGATIVE mg/dL
Ketones, POC UA: NEGATIVE mg/dL
Leukocytes, UA: NEGATIVE
Nitrite, UA: NEGATIVE
POC PROTEIN,UA: NEGATIVE
Spec Grav, UA: 1.01 (ref 1.010–1.025)
Urobilinogen, UA: 0.2 U/dL
pH, UA: 7 (ref 5.0–8.0)

## 2023-09-22 MED ORDER — PHENTERMINE HCL 15 MG PO CAPS: 15.0000 mg | ORAL_CAPSULE | ORAL | 0 refills | Status: DC

## 2023-09-23 ENCOUNTER — Other Ambulatory Visit: Payer: Self-pay

## 2023-09-23 MED ORDER — BUDESONIDE-FORMOTEROL FUMARATE 160-4.5 MCG/ACT IN AERO: 2.0000 | INHALATION_SPRAY | Freq: Two times a day (BID) | RESPIRATORY_TRACT | 0 refills | Status: DC

## 2023-09-24 DIAGNOSIS — M5136 Other intervertebral disc degeneration, lumbar region with discogenic back pain only: Secondary | ICD-10-CM | POA: Diagnosis not present

## 2023-09-24 DIAGNOSIS — M79604 Pain in right leg: Secondary | ICD-10-CM | POA: Diagnosis not present

## 2023-09-24 DIAGNOSIS — M48061 Spinal stenosis, lumbar region without neurogenic claudication: Secondary | ICD-10-CM | POA: Diagnosis not present

## 2023-09-24 DIAGNOSIS — M5137 Other intervertebral disc degeneration, lumbosacral region with discogenic back pain only: Secondary | ICD-10-CM | POA: Diagnosis not present

## 2023-09-24 DIAGNOSIS — M5442 Lumbago with sciatica, left side: Secondary | ICD-10-CM | POA: Diagnosis not present

## 2023-09-29 ENCOUNTER — Other Ambulatory Visit: Payer: Medicare Other

## 2023-10-03 ENCOUNTER — Encounter: Payer: Self-pay | Admitting: Internal Medicine

## 2023-10-03 NOTE — Patient Instructions (Addendum)
Needs to get Tdap at pharmacy. Given 30 day Rx for phentermine to help jump start weight loss. Needs to watch diet since she cannot exercise as much. Continue Ambien at bedtime. Has no side effects with Ambien.which she has taken for years. Continue meds for allergic rhinitis. Continue low dose Rosuvastatin. Return in one year or as needed.

## 2023-10-09 ENCOUNTER — Ambulatory Visit: Payer: Medicare Other | Admitting: Internal Medicine

## 2023-10-09 ENCOUNTER — Encounter: Payer: Medicare Other | Admitting: Internal Medicine

## 2023-10-10 DIAGNOSIS — Z6827 Body mass index (BMI) 27.0-27.9, adult: Secondary | ICD-10-CM | POA: Diagnosis not present

## 2023-10-10 DIAGNOSIS — M5416 Radiculopathy, lumbar region: Secondary | ICD-10-CM | POA: Diagnosis not present

## 2023-10-10 DIAGNOSIS — G8929 Other chronic pain: Secondary | ICD-10-CM | POA: Diagnosis not present

## 2023-10-22 ENCOUNTER — Other Ambulatory Visit: Payer: Self-pay | Admitting: Neurological Surgery

## 2023-10-30 ENCOUNTER — Other Ambulatory Visit: Payer: Self-pay | Admitting: Internal Medicine

## 2023-11-03 ENCOUNTER — Other Ambulatory Visit: Payer: Self-pay | Admitting: Internal Medicine

## 2023-11-03 ENCOUNTER — Other Ambulatory Visit: Payer: Self-pay | Admitting: Allergy

## 2023-11-03 NOTE — Telephone Encounter (Signed)
Called and left a message for patient to call our office back to inform her of medication refill approval. Patient also needed to schedule a follow up appointment with Dr. Selena Batten or nurse practitioner to see how new treatment plan was going and lab redraw per last AVS.

## 2023-11-09 NOTE — Progress Notes (Unsigned)
Follow Up Note  RE: Robin Chaney MRN: [id number] DOB: 03/04/1951 Date of Office Visit: 11/10/2023  Referring provider: Margaree Mackintosh, MD Primary care provider: Margaree Mackintosh, MD  Chief Complaint: No chief complaint on file.  History of Present Illness: I had the pleasure of seeing Robin Chaney for a follow up visit at the [facility] on 11/09/2023. She is a 73 y.o. female, who is being followed for asthma, allergic rhinoconjunctivitis, recurrent infections. Her previous allergy office visit was on 06/11/2023 with Dr. Selena Batten. Today is a regular follow up visit.  Discussed the use of AI scribe software for clinical note transcription with the patient, who gave verbal consent to proceed.  History of Present Illness            ***  Assessment and Plan: Robin Chaney is a 73 y.o. female with: Not well controlled moderate persistent asthma Past history - 2023 CXR showed peribronchial thickening. Using albuterol once a day at least. Used Symbicort for 1 week then stopped as she was concerned if vaginal bleeding was caused by this inhaler. Covid-19 infection 1 year ago. 2023 spirometry showed: possible restrictive disease with 6% improvement in FEV1 post bronchodilator treatment. Clinically feeling improved. No coughing post treatment.  Interim history - flared with hot weather and recent trip to [place]. Today's spirometry was normal. For the next 1-2 weeks - use Breztri 2 puffs twice a day with spacer and rinse mouth afterwards. Samples given. Daily controller medication(s): resume Symbicort 2 puffs twice a day with spacer and rinse mouth afterwards once feeling better.  Continue Singulair (montelukast) 10mg  daily at night. During respiratory infections/flares:  Use Breztri 2 puffs twice a day with spacer and rinse mouth afterwards for 1-2 weeks until your breathing symptoms return to baseline.  Stop Symbicort when using Breztri. May use albuterol rescue inhaler  2 puffs every 4 to 6 hours as needed for shortness of breath, chest tightness, coughing, and wheezing. May use albuterol rescue inhaler 2 puffs 5 to 15 minutes prior to strenuous physical activities. Monitor frequency of use - if you need to use it more than twice per week on a consistent basis let us know.  Consider adding on biologics.   Seasonal allergic rhinitis due to pollen Allergic rhinitis due to animal dander Allergic rhinitis due to mold Allergic conjunctivitis of both eyes Past history - Used to be on AIT for 1 year with good benefit. 1 dog at home. 2023 bloodwork was to dog, grass pollen and mold.  Interim history - stable.  Use over the counter antihistamines such as Zyrtec (cetirizine), Claritin (loratadine), Allegra (fexofenadine), or Xyzal (levocetirizine) daily as needed. May take twice a day during allergy flares. May switch antihistamines every few months. May use Ryaltris (olopatadine + mometasone nasal spray combination) 1-2 sprays per nostril twice a day.  Nasal saline spray (i.e., Simply Saline) or nasal saline lavage (i.e., NeilMed) is recommended as needed and prior to medicated nasal sprays. May use Refresh re-wetting eye drops.    Recurrent infections Past history - Frequent bronchitis episodes. Up to date with flu, pneumonia and Covid-19 vaccines. 2023 bloodwork showed normal immunoglobulin levels, good protection against diptheria, tetanus and pneumoniae.  Interim history - finished zpak. Keep track of infections and antibiotics use. Get CBC diff recheck at next visit.    Elevated blood pressure reading Monitor symptoms and follow up with PCP.  Assessment and Plan  No follow-ups on file.  No orders of the defined types were placed in this encounter.  Lab Orders  No laboratory test(s) ordered today    Diagnostics: Spirometry:  Tracings reviewed. Her effort: {Blank single:19197::"Good reproducible efforts.","It was hard to get consistent  efforts and there is a question as to whether this reflects a maximal maneuver.","Poor effort, data can not be interpreted."} FVC: ***L FEV1: ***L, ***% predicted FEV1/FVC ratio: ***% Interpretation: {Blank single:19197::"Spirometry consistent with mild obstructive disease","Spirometry consistent with moderate obstructive disease","Spirometry consistent with severe obstructive disease","Spirometry consistent with possible restrictive disease","Spirometry consistent with mixed obstructive and restrictive disease","Spirometry uninterpretable due to technique","Spirometry consistent with normal pattern","No overt abnormalities noted given today's efforts"}.  Please see scanned spirometry results for details.  Skin Testing: {Blank single:19197::"Select foods","Environmental allergy panel","Environmental allergy panel and select foods","Food allergy panel","None","Deferred due to recent antihistamines use"}. *** Results discussed with patient/family.   Medication List:  Current Outpatient Medications  Medication Sig Dispense Refill   albuterol (VENTOLIN HFA) 108 (90 Base) MCG/ACT inhaler Inhale 2 puffs into the lungs every 6 (six) hours as needed for wheezing or shortness of breath. 54 g 3   ALPRAZolam (XANAX) 0.5 MG tablet TAKE 1 TABLET(0.5 MG) BY MOUTH TWICE DAILY AS NEEDED FOR ANXIETY 30 tablet 1   budesonide-formoterol (SYMBICORT) 160-4.5 MCG/ACT inhaler Inhale 2 puffs into the lungs in the morning and at bedtime. Use with spacer and rinse mouth afterwards. 30.6 g 0   Cholecalciferol (VITAMIN D3 PO) Take 1,000 Units by mouth daily.      escitalopram (LEXAPRO) 20 MG tablet TAKE 1 TABLET DAILY 90 tablet 3   fexofenadine (ALLEGRA) 180 MG tablet Take 180 mg by mouth daily as needed for allergies.     montelukast (SINGULAIR) 10 MG tablet TAKE 1 TABLET AT BEDTIME 90 tablet 0   phentermine 15 MG capsule Take 1 capsule (15 mg total) by mouth every morning. 30 capsule 0   rosuvastatin (CRESTOR) 5 MG  tablet TAKE 1 TABLET DAILY 90 tablet 3   tretinoin (RETIN-A) 0.1 % cream Apply to affected areas at bedtime as directed. 45 g 1   zolpidem (AMBIEN) 10 MG tablet TAKE 1 TABLET AT BEDTIME 90 tablet 1   No current facility-administered medications for this visit.   Allergies: Allergies  Allergen Reactions   Codeine Nausea Only   I reviewed her past medical history, social history, family history, and environmental history and no significant changes have been reported from her previous visit.  Review of Systems  Constitutional:  Negative for appetite change, chills, fever and unexpected weight change.  HENT:  Negative for congestion and rhinorrhea.   Eyes:  Negative for itching.  Respiratory:  Positive for cough, chest tightness, shortness of breath and wheezing.   Cardiovascular:  Negative for chest pain.  Gastrointestinal:  Negative for abdominal pain.  Genitourinary:  Negative for difficulty urinating.  Skin:  Negative for rash.  Allergic/Immunologic: Positive for environmental allergies.    Objective: There were no vitals taken for this visit. There is no height or weight on file to calculate BMI. Physical Exam Vitals and nursing note reviewed.  Constitutional:      Appearance: Normal appearance. She is well-developed.  HENT:     Head: Normocephalic and atraumatic.     Right Ear: Tympanic membrane and external ear normal.     Left Ear: Tympanic membrane and external ear normal.     Nose: Nose normal.     Mouth/Throat:     Mouth: Mucous membranes are moist.  Pharynx: Oropharynx is clear.  Eyes:     Conjunctiva/sclera: Conjunctivae normal.  Cardiovascular:     Rate and Rhythm: Normal rate and regular rhythm.     Heart sounds: Normal heart sounds. No murmur heard.    No friction rub. No gallop.  Pulmonary:     Effort: Pulmonary effort is normal.     Breath sounds: Normal breath sounds. No wheezing, rhonchi or rales.  Musculoskeletal:     Cervical back: Neck supple.   Skin:    General: Skin is warm.     Findings: No rash.  Neurological:     Mental Status: She is alert and oriented to person, place, and time.  Psychiatric:        Behavior: Behavior normal.    Previous notes and tests were reviewed. The plan was reviewed with the patient/family, and all questions/concerned were addressed.  It was my pleasure to see Robin Chaney today and participate in her care. Please feel free to contact me with any questions or concerns.  Sincerely,  Wyline Mood, DO Allergy & Immunology  [facility] office: [phone] [facility] office: [phone]

## 2023-11-10 ENCOUNTER — Encounter: Payer: Self-pay | Admitting: Allergy

## 2023-11-10 ENCOUNTER — Ambulatory Visit: Payer: Medicare Other | Admitting: Allergy

## 2023-11-10 ENCOUNTER — Other Ambulatory Visit: Payer: Self-pay

## 2023-11-10 VITALS — BP 138/66 | HR 75 | Temp 98.5°F | Resp 16 | Ht 64.0 in | Wt 156.1 lb

## 2023-11-10 DIAGNOSIS — J4540 Moderate persistent asthma, uncomplicated: Secondary | ICD-10-CM

## 2023-11-10 DIAGNOSIS — J301 Allergic rhinitis due to pollen: Secondary | ICD-10-CM | POA: Diagnosis not present

## 2023-11-10 DIAGNOSIS — J3089 Other allergic rhinitis: Secondary | ICD-10-CM

## 2023-11-10 DIAGNOSIS — J3081 Allergic rhinitis due to animal (cat) (dog) hair and dander: Secondary | ICD-10-CM | POA: Diagnosis not present

## 2023-11-10 DIAGNOSIS — B999 Unspecified infectious disease: Secondary | ICD-10-CM

## 2023-11-10 DIAGNOSIS — E739 Lactose intolerance, unspecified: Secondary | ICD-10-CM

## 2023-11-10 DIAGNOSIS — J988 Other specified respiratory disorders: Secondary | ICD-10-CM

## 2023-11-10 DIAGNOSIS — H1013 Acute atopic conjunctivitis, bilateral: Secondary | ICD-10-CM

## 2023-11-10 MED ORDER — AZITHROMYCIN 250 MG PO TABS: ORAL_TABLET | ORAL | 0 refills | Status: DC

## 2023-11-10 MED ORDER — METHYLPREDNISOLONE 4 MG PO TBPK: ORAL_TABLET | ORAL | 0 refills | Status: DC

## 2023-11-10 MED ORDER — BREZTRI AEROSPHERE 160-9-4.8 MCG/ACT IN AERO: 2.0000 | INHALATION_SPRAY | Freq: Two times a day (BID) | RESPIRATORY_TRACT | 2 refills | Status: DC

## 2023-11-10 NOTE — Patient Instructions (Addendum)
Asthma flare/possible infection Start medrol pak. Start zpak. If not better - let us know.   Breathing/coughing Daily controller medication(s): start Breztri 2 puffs twice a day with spacer and rinse mouth afterwards once feeling better. Samples given.  If you run out of samples before you can get the prescription let us know.  Stop Symbicort.  Continue Singulair (montelukast) 10mg  daily at night. May use albuterol rescue inhaler 2 puffs every 4 to 6 hours as needed for shortness of breath, chest tightness, coughing, and wheezing. May use albuterol rescue inhaler 2 puffs 5 to 15 minutes prior to strenuous physical activities. Monitor frequency of use - if you need to use it more than twice per week on a consistent basis let us know.  Breathing control goals:  Full participation in all desired activities (may need albuterol before activity) Albuterol use two times or less a week on average (not counting use with activity) Cough interfering with sleep two times or less a month Oral steroids no more than once a year No hospitalizations   Environmental allergies 2023 bloodwork positive to dog, grass pollen and mold.  Use over the counter antihistamines such as Zyrtec (cetirizine), Claritin (loratadine), Allegra (fexofenadine), or Xyzal (levocetirizine) daily as needed. May take twice a day during allergy flares. May switch antihistamines every few months. May use Ryaltris (olopatadine + mometasone nasal spray combination) 1-2 sprays per nostril twice a day.  Nasal saline spray (i.e., Simply Saline) or nasal saline lavage (i.e., NeilMed) is recommended as needed and prior to medicated nasal sprays. May use Refresh re-wetting eye drops.   Recurrent infections Keep track of infections and antibiotics use.  Elevated blood pressure Monitor symptoms.   Lactose intolerance May use lactose free milk or take a lactaid pill right before consuming anything with dairy.  Follow up in 3 months or  sooner if needed.

## 2023-11-24 ENCOUNTER — Encounter (HOSPITAL_COMMUNITY): Payer: Self-pay

## 2023-11-24 ENCOUNTER — Other Ambulatory Visit: Payer: Self-pay | Admitting: Neurological Surgery

## 2023-11-24 NOTE — Progress Notes (Signed)
 PCP - Dr Luanna Cole. [place] Cardiologist - n/a  Chest x-ray - n/a EKG - n/a Stress Test - n/a ECHO - n/a Cardiac Cath - n/a  ICD Pacemaker/Loop - n/a  Sleep Study -  n/a  Diabetes - n/a  Aspirin & Blood Thinner Instructions:  n/a  NPO   Anesthesia review: no  STOP now taking any Aspirin (unless otherwise instructed by your surgeon), Aleve, Naproxen, Ibuprofen, Motrin, Advil, Goody's, BC's, all herbal medications, fish oil, and all vitamins.   Coronavirus Screening Do you have any of the following symptoms:  Cough yes/no: No Fever (>100.18F)  yes/no: No Runny nose yes/no: No Sore throat yes/no: No Difficulty breathing/shortness of breath  yes/no: No  Have you traveled in the last 14 days and where? yes/no: No  Patient verbalized understanding of instructions that were given to them at the PAT appointment. Patient was also instructed that they will need to review over the PAT instructions again at home before surgery.

## 2023-11-24 NOTE — Pre-Procedure Instructions (Signed)
 Surgical Instructions   Your procedure is scheduled on December 01, 2023. Report to [facility] Main Entrance "A" at 5:30 A.M., then check in with the Admitting office. Any questions or running late day of surgery: call [phone]  Questions prior to your surgery date: call [phone], Monday-Friday, 8am-4pm. If you experience any cold or flu symptoms such as cough, fever, chills, shortness of breath, etc. between now and your scheduled surgery, please notify us at the above number.     Remember:  Do not eat or drink after midnight the night before your surgery. No gum, mints, or hard candy.       Take these medicines the morning of surgery with A SIP OF WATER:  Budeson-Glycopyrrol-Formoterol (BREZTRI AEROSPHERE)  escitalopram (LEXAPRO)  rosuvastatin (CRESTOR)   May take these medicines IF NEEDED: albuterol (VENTOLIN HFA) 108 (90 Base) MCG/ACT inhaler - please bring your inhaler to the hospital with you.  ALPRAZolam (XANAX)  fexofenadine (ALLEGRA)   One week prior to surgery, STOP taking any Aspirin (unless otherwise instructed by your surgeon) Aleve, Naproxen, Ibuprofen, Motrin, Advil, Goody's, BC's, all herbal medications, fish oil, and non-prescription vitamins.                     Do NOT Smoke (Tobacco/Vaping) for 24 hours prior to your procedure.  If you use a CPAP at night, you may bring your mask/headgear for your overnight stay.   You will be asked to remove any contacts, glasses, piercing's, hearing aid's, dentures/partials prior to surgery. Please bring cases for these items if needed.    Patients discharged the day of surgery will not be allowed to drive home, and someone needs to stay with them for 24 hours.  SURGICAL WAITING ROOM VISITATION Patients may have no more than 2 support people in the waiting area - these visitors may rotate.   Pre-op nurse will coordinate an appropriate time for 1 ADULT support person, who may not rotate, to accompany patient in  pre-op.  Children under the age of 60 must have an adult with them who is not the patient and must remain in the main waiting area with an adult.  If the patient needs to stay at the hospital during part of their recovery, the visitor guidelines for inpatient rooms apply.  Please refer to the [facility] website for the visitor guidelines for any additional information.   If you received a COVID test during your pre-op visit  it is requested that you wear a mask when out in public, stay away from anyone that may not be feeling well and notify your surgeon if you develop symptoms. If you have been in contact with anyone that has tested positive in the last 10 days please notify you surgeon.      Pre-operative 5 CHG Bathing Instructions   You can play a key role in reducing the risk of infection after surgery. Your skin needs to be as free of germs as possible. You can reduce the number of germs on your skin by washing with CHG (chlorhexidine gluconate) soap before surgery. CHG is an antiseptic soap that kills germs and continues to kill germs even after washing.   DO NOT use if you have an allergy to chlorhexidine/CHG or antibacterial soaps. If your skin becomes reddened or irritated, stop using the CHG and notify one of our RNs at [phone].   Please shower with the CHG soap starting 4 days before surgery using the following schedule:  Please keep in mind the following:  DO NOT shave, including legs and underarms, starting the day of your first shower.   You may shave your face at any point before/day of surgery.  Place clean sheets on your bed the day you start using CHG soap. Use a clean washcloth (not used since being washed) for each shower. DO NOT sleep with pets once you start using the CHG.   CHG Shower Instructions:  Wash your face and private area with normal soap. If you choose to wash your hair, wash first with your normal shampoo.  After you use shampoo/soap, rinse  your hair and body thoroughly to remove shampoo/soap residue.  Turn the water OFF and apply about 3 tablespoons (45 ml) of CHG soap to a CLEAN washcloth.  Apply CHG soap ONLY FROM YOUR NECK DOWN TO YOUR TOES (washing for 3-5 minutes)  DO NOT use CHG soap on face, private areas, open wounds, or sores.  Pay special attention to the area where your surgery is being performed.  If you are having back surgery, having someone wash your back for you may be helpful. Wait 2 minutes after CHG soap is applied, then you may rinse off the CHG soap.  Pat dry with a clean towel  Put on clean clothes/pajamas   If you choose to wear lotion, please use ONLY the CHG-compatible lotions that are listed below.  Additional instructions for the day of surgery: DO NOT APPLY any lotions, deodorants, cologne, or perfumes.   Do not bring valuables to the hospital. [facility] is not responsible for any belongings/valuables. Do not wear nail polish, gel polish, artificial nails, or any other type of covering on natural nails (fingers and toes) Do not wear jewelry or makeup Put on clean/comfortable clothes.  Please brush your teeth.  Ask your nurse before applying any prescription medications to the skin.     CHG Compatible Lotions   Aveeno Moisturizing lotion  Cetaphil Moisturizing Cream  Cetaphil Moisturizing Lotion  Clairol Herbal Essence Moisturizing Lotion, Dry Skin  Clairol Herbal Essence Moisturizing Lotion, Extra Dry Skin  Clairol Herbal Essence Moisturizing Lotion, Normal Skin  Curel Age Defying Therapeutic Moisturizing Lotion with Alpha Hydroxy  Curel Extreme Care Body Lotion  Curel Soothing Hands Moisturizing Hand Lotion  Curel Therapeutic Moisturizing Cream, Fragrance-Free  Curel Therapeutic Moisturizing Lotion, Fragrance-Free  Curel Therapeutic Moisturizing Lotion, Original Formula  Eucerin Daily Replenishing Lotion  Eucerin Dry Skin Therapy Plus Alpha Hydroxy Crme  Eucerin Dry Skin Therapy  Plus Alpha Hydroxy Lotion  Eucerin Original Crme  Eucerin Original Lotion  Eucerin Plus Crme Eucerin Plus Lotion  Eucerin TriLipid Replenishing Lotion  Keri Anti-Bacterial Hand Lotion  Keri Deep Conditioning Original Lotion Dry Skin Formula Softly Scented  Keri Deep Conditioning Original Lotion, Fragrance Free Sensitive Skin Formula  Keri Lotion Fast Absorbing Fragrance Free Sensitive Skin Formula  Keri Lotion Fast Absorbing Softly Scented Dry Skin Formula  Keri Original Lotion  Keri Skin Renewal Lotion Keri Silky Smooth Lotion  Keri Silky Smooth Sensitive Skin Lotion  Nivea Body Creamy Conditioning Oil  Nivea Body Extra Enriched Lotion  Nivea Body Original Lotion  Nivea Body Sheer Moisturizing Lotion Nivea Crme  Nivea Skin Firming Lotion  NutraDerm 30 Skin Lotion  NutraDerm Skin Lotion  NutraDerm Therapeutic Skin Cream  NutraDerm Therapeutic Skin Lotion  ProShield Protective Hand Cream  Provon moisturizing lotion  Please read over the following fact sheets that you were given.

## 2023-11-25 ENCOUNTER — Encounter (HOSPITAL_COMMUNITY)
Admission: RE | Admit: 2023-11-25 | Discharge: 2023-11-25 | Disposition: A | Discharge status: Home / Self Care | Payer: Medicare Other | Source: Ambulatory Visit | Attending: Neurological Surgery | Admitting: Neurological Surgery

## 2023-11-25 ENCOUNTER — Encounter (HOSPITAL_COMMUNITY): Payer: Self-pay

## 2023-11-25 ENCOUNTER — Other Ambulatory Visit: Payer: Self-pay | Admitting: Neurological Surgery

## 2023-11-25 ENCOUNTER — Other Ambulatory Visit: Payer: Self-pay

## 2023-11-25 DIAGNOSIS — Z01818 Encounter for other preprocedural examination: Secondary | ICD-10-CM

## 2023-11-25 DIAGNOSIS — Z01812 Encounter for preprocedural laboratory examination: Secondary | ICD-10-CM | POA: Diagnosis not present

## 2023-11-25 HISTORY — DX: Hyperlipidemia, unspecified: E78.5

## 2023-11-25 HISTORY — DX: Other chronic pain: G89.29

## 2023-11-25 HISTORY — DX: Pneumonia, unspecified organism: J18.9

## 2023-11-25 HISTORY — DX: Bronchitis, not specified as acute or chronic: J40

## 2023-11-25 HISTORY — DX: Dyspnea, unspecified: R06.00

## 2023-11-25 HISTORY — DX: Gastro-esophageal reflux disease without esophagitis: K21.9

## 2023-11-25 HISTORY — DX: Insomnia, unspecified: G47.00

## 2023-11-25 HISTORY — DX: Unspecified osteoarthritis, unspecified site: M19.90

## 2023-11-25 HISTORY — DX: Depression, unspecified: F32.A

## 2023-11-25 HISTORY — DX: Anxiety disorder, unspecified: F41.9

## 2023-11-25 LAB — CBC
HCT: 40.7 % (ref 36.0–46.0)
Hemoglobin: 13.5 g/dL (ref 12.0–15.0)
MCH: 31 pg (ref 26.0–34.0)
MCHC: 33.2 g/dL (ref 30.0–36.0)
MCV: 93.6 fL (ref 80.0–100.0)
Platelets: 309 10*3/uL (ref 150–400)
RBC: 4.35 MIL/uL (ref 3.87–5.11)
RDW: 12.7 % (ref 11.5–15.5)
WBC: 4.4 10*3/uL (ref 4.0–10.5)
nRBC: 0 % (ref 0.0–0.2)

## 2023-11-25 LAB — SURGICAL PCR SCREEN
MRSA, PCR: NEGATIVE
Staphylococcus aureus: NEGATIVE

## 2023-11-25 LAB — TYPE AND SCREEN
ABO/RH(D): A POS
Antibody Screen: NEGATIVE

## 2023-12-01 ENCOUNTER — Inpatient Hospital Stay (HOSPITAL_COMMUNITY): Payer: Medicare Other

## 2023-12-01 ENCOUNTER — Inpatient Hospital Stay (HOSPITAL_COMMUNITY): Payer: Medicare Other | Admitting: Certified Registered"

## 2023-12-01 ENCOUNTER — Other Ambulatory Visit: Payer: Self-pay

## 2023-12-01 ENCOUNTER — Inpatient Hospital Stay (HOSPITAL_COMMUNITY)
Admission: RE | Admit: 2023-12-01 | Discharge: 2023-12-05 | DRG: 458 | Disposition: A | Discharge status: Home Health | Payer: Medicare Other | Attending: Neurological Surgery | Admitting: Neurological Surgery

## 2023-12-01 ENCOUNTER — Encounter (HOSPITAL_COMMUNITY)
Admission: RE | Disposition: A | Discharge status: Home Health | Payer: Self-pay | Source: Home / Self Care | Attending: Neurological Surgery

## 2023-12-01 ENCOUNTER — Encounter (HOSPITAL_COMMUNITY): Payer: Self-pay | Admitting: Neurological Surgery

## 2023-12-01 DIAGNOSIS — J45909 Unspecified asthma, uncomplicated: Secondary | ICD-10-CM | POA: Diagnosis not present

## 2023-12-01 DIAGNOSIS — F32A Depression, unspecified: Secondary | ICD-10-CM | POA: Diagnosis not present

## 2023-12-01 DIAGNOSIS — Z803 Family history of malignant neoplasm of breast: Secondary | ICD-10-CM | POA: Diagnosis not present

## 2023-12-01 DIAGNOSIS — Z825 Family history of asthma and other chronic lower respiratory diseases: Secondary | ICD-10-CM

## 2023-12-01 DIAGNOSIS — F419 Anxiety disorder, unspecified: Secondary | ICD-10-CM | POA: Diagnosis present

## 2023-12-01 DIAGNOSIS — M48061 Spinal stenosis, lumbar region without neurogenic claudication: Secondary | ICD-10-CM | POA: Diagnosis not present

## 2023-12-01 DIAGNOSIS — Z01818 Encounter for other preprocedural examination: Principal | ICD-10-CM

## 2023-12-01 DIAGNOSIS — Z801 Family history of malignant neoplasm of trachea, bronchus and lung: Secondary | ICD-10-CM

## 2023-12-01 DIAGNOSIS — M4156 Other secondary scoliosis, lumbar region: Secondary | ICD-10-CM | POA: Diagnosis not present

## 2023-12-01 DIAGNOSIS — E785 Hyperlipidemia, unspecified: Secondary | ICD-10-CM | POA: Diagnosis present

## 2023-12-01 DIAGNOSIS — Z8249 Family history of ischemic heart disease and other diseases of the circulatory system: Secondary | ICD-10-CM

## 2023-12-01 DIAGNOSIS — M5416 Radiculopathy, lumbar region: Secondary | ICD-10-CM | POA: Diagnosis not present

## 2023-12-01 DIAGNOSIS — Z7951 Long term (current) use of inhaled steroids: Secondary | ICD-10-CM

## 2023-12-01 DIAGNOSIS — Z79899 Other long term (current) drug therapy: Secondary | ICD-10-CM | POA: Diagnosis not present

## 2023-12-01 DIAGNOSIS — M4726 Other spondylosis with radiculopathy, lumbar region: Secondary | ICD-10-CM | POA: Diagnosis present

## 2023-12-01 DIAGNOSIS — Z8261 Family history of arthritis: Secondary | ICD-10-CM

## 2023-12-01 DIAGNOSIS — M48062 Spinal stenosis, lumbar region with neurogenic claudication: Secondary | ICD-10-CM | POA: Diagnosis not present

## 2023-12-01 HISTORY — PX: LUMBAR PERCUTANEOUS PEDICLE SCREW 3 LEVEL: SHX5562

## 2023-12-01 HISTORY — PX: ANTERIOR LAT LUMBAR FUSION: SHX1168

## 2023-12-01 LAB — ABO/RH: ABO/RH(D): A POS

## 2023-12-01 SURGERY — ANTERIOR LATERAL LUMBAR FUSION 3 LEVELS
Anesthesia: General | Site: Spine Lumbar | Laterality: Right

## 2023-12-01 MED ORDER — MENTHOL 3 MG MT LOZG: 1.0000 | LOZENGE | OROMUCOSAL | Status: DC | PRN

## 2023-12-01 MED ORDER — DEXAMETHASONE SODIUM PHOSPHATE 10 MG/ML IJ SOLN
INTRAMUSCULAR | Status: AC
  Filled 2023-12-01: qty 1

## 2023-12-01 MED ORDER — CEFAZOLIN SODIUM-DEXTROSE 2-4 GM/100ML-% IV SOLN
2.0000 g | Freq: Three times a day (TID) | INTRAVENOUS | Status: AC
  Administered 2023-12-01 – 2023-12-02 (×2): 2 g via INTRAVENOUS
  Filled 2023-12-01 (×2): qty 100

## 2023-12-01 MED ORDER — ZOLPIDEM TARTRATE 5 MG PO TABS
10.0000 mg | ORAL_TABLET | Freq: Every day | ORAL | Status: DC
  Administered 2023-12-01 – 2023-12-04 (×4): 10 mg via ORAL
  Filled 2023-12-01 (×4): qty 2

## 2023-12-01 MED ORDER — ALPRAZOLAM 0.5 MG PO TABS
0.5000 mg | ORAL_TABLET | Freq: Two times a day (BID) | ORAL | Status: DC | PRN
  Filled 2023-12-01: qty 1

## 2023-12-01 MED ORDER — PROPOFOL 10 MG/ML IV BOLUS
INTRAVENOUS | Status: AC
  Filled 2023-12-01: qty 20

## 2023-12-01 MED ORDER — HYDROMORPHONE HCL 1 MG/ML IJ SOLN
INTRAMUSCULAR | Status: AC
  Filled 2023-12-01: qty 1

## 2023-12-01 MED ORDER — ONDANSETRON HCL 4 MG PO TABS: 4.0000 mg | ORAL_TABLET | Freq: Four times a day (QID) | ORAL | Status: DC | PRN

## 2023-12-01 MED ORDER — DEXMEDETOMIDINE HCL IN NACL 80 MCG/20ML IV SOLN
INTRAVENOUS | Status: DC | PRN
  Administered 2023-12-01: 12 ug via INTRAVENOUS

## 2023-12-01 MED ORDER — GLYCOPYRROLATE PF 0.2 MG/ML IJ SOSY
PREFILLED_SYRINGE | INTRAMUSCULAR | Status: DC | PRN
  Administered 2023-12-01 (×2): .1 mg via INTRAVENOUS

## 2023-12-01 MED ORDER — DOCUSATE SODIUM 100 MG PO CAPS
100.0000 mg | ORAL_CAPSULE | Freq: Two times a day (BID) | ORAL | Status: DC
  Administered 2023-12-03 – 2023-12-05 (×5): 100 mg via ORAL
  Filled 2023-12-01 (×7): qty 1

## 2023-12-01 MED ORDER — KETOROLAC TROMETHAMINE 30 MG/ML IJ SOLN
INTRAMUSCULAR | Status: AC
  Filled 2023-12-01: qty 1

## 2023-12-01 MED ORDER — MONTELUKAST SODIUM 10 MG PO TABS
10.0000 mg | ORAL_TABLET | Freq: Every day | ORAL | Status: DC
  Administered 2023-12-01 – 2023-12-04 (×4): 10 mg via ORAL
  Filled 2023-12-01 (×4): qty 1

## 2023-12-01 MED ORDER — LORATADINE 10 MG PO TABS
10.0000 mg | ORAL_TABLET | Freq: Every day | ORAL | Status: DC
  Filled 2023-12-01 (×3): qty 1

## 2023-12-01 MED ORDER — HYDROMORPHONE HCL 1 MG/ML IJ SOLN
INTRAMUSCULAR | Status: AC
  Filled 2023-12-01: qty 0.5

## 2023-12-01 MED ORDER — ONDANSETRON HCL 4 MG/2ML IJ SOLN
INTRAMUSCULAR | Status: DC | PRN
  Administered 2023-12-01: 4 mg via INTRAVENOUS

## 2023-12-01 MED ORDER — ACETAMINOPHEN 325 MG PO TABS
650.0000 mg | ORAL_TABLET | ORAL | Status: DC | PRN
  Administered 2023-12-03 – 2023-12-05 (×2): 650 mg via ORAL
  Filled 2023-12-01 (×2): qty 2

## 2023-12-01 MED ORDER — MIDAZOLAM HCL 2 MG/2ML IJ SOLN
INTRAMUSCULAR | Status: AC
  Filled 2023-12-01: qty 2

## 2023-12-01 MED ORDER — HYDROMORPHONE HCL 1 MG/ML IJ SOLN
INTRAMUSCULAR | Status: AC
Start: 2023-12-01 — End: 2023-12-02
  Filled 2023-12-01: qty 1

## 2023-12-01 MED ORDER — FENTANYL CITRATE (PF) 250 MCG/5ML IJ SOLN
INTRAMUSCULAR | Status: AC
  Filled 2023-12-01: qty 5

## 2023-12-01 MED ORDER — ROCURONIUM BROMIDE 10 MG/ML (PF) SYRINGE
PREFILLED_SYRINGE | INTRAVENOUS | Status: AC
  Filled 2023-12-01: qty 10

## 2023-12-01 MED ORDER — ACETAMINOPHEN 650 MG RE SUPP: 650.0000 mg | RECTAL | Status: DC | PRN

## 2023-12-01 MED ORDER — LIDOCAINE-EPINEPHRINE 1 %-1:100000 IJ SOLN
INTRAMUSCULAR | Status: AC
Start: 2023-12-01 — End: ?
  Filled 2023-12-01: qty 1

## 2023-12-01 MED ORDER — PHENOL 1.4 % MT LIQD: 1.0000 | OROMUCOSAL | Status: DC | PRN

## 2023-12-01 MED ORDER — ROCURONIUM BROMIDE 10 MG/ML (PF) SYRINGE
PREFILLED_SYRINGE | INTRAVENOUS | Status: DC | PRN
  Administered 2023-12-01: 50 mg via INTRAVENOUS

## 2023-12-01 MED ORDER — HYDROMORPHONE HCL 1 MG/ML IJ SOLN
INTRAMUSCULAR | Status: DC | PRN
  Administered 2023-12-01 (×2): .5 mg via INTRAVENOUS

## 2023-12-01 MED ORDER — SODIUM CHLORIDE 0.9% FLUSH: 3.0000 mL | INTRAVENOUS | Status: DC | PRN

## 2023-12-01 MED ORDER — POLYETHYLENE GLYCOL 3350 17 G PO PACK
17.0000 g | PACK | Freq: Every day | ORAL | Status: DC | PRN
  Administered 2023-12-04 – 2023-12-05 (×2): 17 g via ORAL
  Filled 2023-12-01 (×2): qty 1

## 2023-12-01 MED ORDER — OXYCODONE-ACETAMINOPHEN 5-325 MG PO TABS
1.0000 | ORAL_TABLET | ORAL | Status: DC | PRN
  Administered 2023-12-01 – 2023-12-02 (×2): 1 via ORAL
  Administered 2023-12-02 (×3): 2 via ORAL
  Administered 2023-12-02: 1 via ORAL
  Administered 2023-12-02 – 2023-12-05 (×9): 2 via ORAL
  Filled 2023-12-01 (×6): qty 2
  Filled 2023-12-01: qty 1
  Filled 2023-12-01 (×7): qty 2
  Filled 2023-12-01: qty 1
  Filled 2023-12-01: qty 2

## 2023-12-01 MED ORDER — FENTANYL CITRATE (PF) 250 MCG/5ML IJ SOLN
INTRAMUSCULAR | Status: DC | PRN
  Administered 2023-12-01 (×5): 50 ug via INTRAVENOUS

## 2023-12-01 MED ORDER — UMECLIDINIUM BROMIDE 62.5 MCG/ACT IN AEPB
1.0000 | INHALATION_SPRAY | Freq: Every day | RESPIRATORY_TRACT | Status: DC
Start: 2023-12-01 — End: 2023-12-05
  Filled 2023-12-01: qty 7

## 2023-12-01 MED ORDER — DROPERIDOL 2.5 MG/ML IJ SOLN: 0.6250 mg | Freq: Once | INTRAMUSCULAR | Status: DC | PRN

## 2023-12-01 MED ORDER — PROPOFOL 1000 MG/100ML IV EMUL
INTRAVENOUS | Status: AC
  Filled 2023-12-01: qty 100

## 2023-12-01 MED ORDER — BUPIVACAINE HCL (PF) 0.5 % IJ SOLN
INTRAMUSCULAR | Status: DC | PRN
  Administered 2023-12-01: 5 mL
  Administered 2023-12-01: 10 mL

## 2023-12-01 MED ORDER — ALBUMIN HUMAN 5 % IV SOLN: INTRAVENOUS | Status: DC | PRN

## 2023-12-01 MED ORDER — GLYCOPYRROLATE PF 0.2 MG/ML IJ SOSY
PREFILLED_SYRINGE | INTRAMUSCULAR | Status: AC
Start: 2023-12-01 — End: ?
  Filled 2023-12-01: qty 1

## 2023-12-01 MED ORDER — LIDOCAINE 2% (20 MG/ML) 5 ML SYRINGE
INTRAMUSCULAR | Status: AC
  Filled 2023-12-01: qty 5

## 2023-12-01 MED ORDER — HYDROMORPHONE HCL 1 MG/ML IJ SOLN
0.5000 mg | INTRAMUSCULAR | Status: DC | PRN
  Administered 2023-12-01 – 2023-12-02 (×4): 0.5 mg via INTRAVENOUS
  Filled 2023-12-01 (×4): qty 0.5

## 2023-12-01 MED ORDER — DEXAMETHASONE SODIUM PHOSPHATE 10 MG/ML IJ SOLN
INTRAMUSCULAR | Status: DC | PRN
  Administered 2023-12-01: 10 mg via INTRAVENOUS

## 2023-12-01 MED ORDER — LIDOCAINE 2% (20 MG/ML) 5 ML SYRINGE
INTRAMUSCULAR | Status: DC | PRN
  Administered 2023-12-01: 40 mg via INTRAVENOUS

## 2023-12-01 MED ORDER — CHLORHEXIDINE GLUCONATE CLOTH 2 % EX PADS: 6.0000 | MEDICATED_PAD | Freq: Once | CUTANEOUS | Status: DC

## 2023-12-01 MED ORDER — LACTATED RINGERS IV SOLN: INTRAVENOUS | Status: DC | PRN

## 2023-12-01 MED ORDER — CEFAZOLIN SODIUM-DEXTROSE 2-4 GM/100ML-% IV SOLN
2.0000 g | INTRAVENOUS | Status: AC
  Administered 2023-12-01 (×3): 2 g via INTRAVENOUS
  Filled 2023-12-01: qty 100

## 2023-12-01 MED ORDER — ESCITALOPRAM OXALATE 10 MG PO TABS
20.0000 mg | ORAL_TABLET | Freq: Every day | ORAL | Status: DC
  Administered 2023-12-01 – 2023-12-04 (×4): 20 mg via ORAL
  Filled 2023-12-01 (×2): qty 2
  Filled 2023-12-01 (×2): qty 1
  Filled 2023-12-01 (×2): qty 2

## 2023-12-01 MED ORDER — ACETAMINOPHEN 325 MG PO TABS: 325.0000 mg | ORAL_TABLET | Freq: Once | ORAL | Status: DC | PRN

## 2023-12-01 MED ORDER — PHENTERMINE HCL 15 MG PO CAPS: 15.0000 mg | ORAL_CAPSULE | ORAL | Status: DC

## 2023-12-01 MED ORDER — MEPERIDINE HCL 25 MG/ML IJ SOLN: 6.2500 mg | INTRAMUSCULAR | Status: DC | PRN

## 2023-12-01 MED ORDER — METHOCARBAMOL 500 MG PO TABS
500.0000 mg | ORAL_TABLET | Freq: Four times a day (QID) | ORAL | Status: DC | PRN
  Administered 2023-12-01 – 2023-12-03 (×4): 500 mg via ORAL
  Filled 2023-12-01 (×4): qty 1

## 2023-12-01 MED ORDER — ALUM & MAG HYDROXIDE-SIMETH 200-200-20 MG/5ML PO SUSP: 30.0000 mL | Freq: Four times a day (QID) | ORAL | Status: DC | PRN

## 2023-12-01 MED ORDER — FLEET ENEMA RE ENEM: 1.0000 | ENEMA | Freq: Once | RECTAL | Status: DC | PRN

## 2023-12-01 MED ORDER — BUDESON-GLYCOPYRROL-FORMOTEROL 160-9-4.8 MCG/ACT IN AERO: 2.0000 | INHALATION_SPRAY | Freq: Two times a day (BID) | RESPIRATORY_TRACT | Status: DC

## 2023-12-01 MED ORDER — LACTATED RINGERS IV SOLN: INTRAVENOUS | Status: DC

## 2023-12-01 MED ORDER — HYDROMORPHONE HCL 1 MG/ML IJ SOLN
0.2500 mg | INTRAMUSCULAR | Status: DC | PRN
  Administered 2023-12-01 (×3): 0.5 mg via INTRAVENOUS
  Administered 2023-12-01 (×2): 0.25 mg via INTRAVENOUS

## 2023-12-01 MED ORDER — SODIUM CHLORIDE 0.9 % IV SOLN
0.1500 ug/kg/min | INTRAVENOUS | Status: DC
  Filled 2023-12-01: qty 2000

## 2023-12-01 MED ORDER — ACETAMINOPHEN 10 MG/ML IV SOLN: 1000.0000 mg | Freq: Once | INTRAVENOUS | Status: DC | PRN

## 2023-12-01 MED ORDER — PROPOFOL 10 MG/ML IV BOLUS
INTRAVENOUS | Status: DC | PRN
  Administered 2023-12-01: 80 mg via INTRAVENOUS

## 2023-12-01 MED ORDER — EPHEDRINE 5 MG/ML INJ
INTRAVENOUS | Status: AC
  Filled 2023-12-01: qty 5

## 2023-12-01 MED ORDER — 0.9 % SODIUM CHLORIDE (POUR BTL) OPTIME
TOPICAL | Status: DC | PRN
  Administered 2023-12-01: 1000 mL

## 2023-12-01 MED ORDER — ACETAMINOPHEN 160 MG/5ML PO SOLN: 325.0000 mg | Freq: Once | ORAL | Status: DC | PRN

## 2023-12-01 MED ORDER — SODIUM CHLORIDE 0.9 % IV SOLN
0.1500 ug/kg/min | INTRAVENOUS | Status: AC
  Administered 2023-12-01: .05 ug/kg/min via INTRAVENOUS
  Filled 2023-12-01: qty 2000

## 2023-12-01 MED ORDER — DEXMEDETOMIDINE HCL IN NACL 80 MCG/20ML IV SOLN
INTRAVENOUS | Status: AC
  Filled 2023-12-01: qty 20

## 2023-12-01 MED ORDER — FLUTICASONE FUROATE-VILANTEROL 200-25 MCG/ACT IN AEPB
1.0000 | INHALATION_SPRAY | Freq: Every day | RESPIRATORY_TRACT | Status: DC
Start: 2023-12-01 — End: 2023-12-05
  Filled 2023-12-01: qty 28

## 2023-12-01 MED ORDER — BISACODYL 10 MG RE SUPP
10.0000 mg | Freq: Every day | RECTAL | Status: DC | PRN
  Filled 2023-12-01: qty 1

## 2023-12-01 MED ORDER — METHOCARBAMOL 1000 MG/10ML IJ SOLN: 500.0000 mg | Freq: Four times a day (QID) | INTRAMUSCULAR | Status: DC | PRN

## 2023-12-01 MED ORDER — PHENYLEPHRINE HCL-NACL 20-0.9 MG/250ML-% IV SOLN
INTRAVENOUS | Status: DC | PRN
  Administered 2023-12-01: 80 ug via INTRAVENOUS
  Administered 2023-12-01 (×2): 40 ug via INTRAVENOUS
  Administered 2023-12-01: 10 ug/min via INTRAVENOUS
  Administered 2023-12-01: 80 ug via INTRAVENOUS
  Administered 2023-12-01: 40 ug via INTRAVENOUS

## 2023-12-01 MED ORDER — SODIUM CHLORIDE 0.9% FLUSH
3.0000 mL | Freq: Two times a day (BID) | INTRAVENOUS | Status: DC
  Administered 2023-12-01 – 2023-12-04 (×7): 3 mL via INTRAVENOUS

## 2023-12-01 MED ORDER — CEFAZOLIN SODIUM 1 G IJ SOLR
INTRAMUSCULAR | Status: AC
  Filled 2023-12-01: qty 40

## 2023-12-01 MED ORDER — CHLORHEXIDINE GLUCONATE 0.12 % MT SOLN
OROMUCOSAL | Status: AC
  Administered 2023-12-01: 15 mL
  Filled 2023-12-01: qty 15

## 2023-12-01 MED ORDER — PROPOFOL 500 MG/50ML IV EMUL
INTRAVENOUS | Status: DC | PRN
  Administered 2023-12-01: 125 ug/kg/min via INTRAVENOUS

## 2023-12-01 MED ORDER — THROMBIN 5000 UNITS EX SOLR: OROMUCOSAL | Status: DC | PRN

## 2023-12-01 MED ORDER — SODIUM CHLORIDE 0.9 % IV SOLN
250.0000 mL | INTRAVENOUS | Status: AC
  Administered 2023-12-01: 250 mL via INTRAVENOUS

## 2023-12-01 MED ORDER — ONDANSETRON HCL 4 MG/2ML IJ SOLN
INTRAMUSCULAR | Status: AC
  Filled 2023-12-01: qty 2

## 2023-12-01 MED ORDER — MIDAZOLAM HCL 2 MG/2ML IJ SOLN
INTRAMUSCULAR | Status: DC | PRN
  Administered 2023-12-01: 2 mg via INTRAVENOUS

## 2023-12-01 MED ORDER — SUGAMMADEX SODIUM 200 MG/2ML IV SOLN
INTRAVENOUS | Status: DC | PRN
  Administered 2023-12-01: 150 mg via INTRAVENOUS

## 2023-12-01 MED ORDER — SENNA 8.6 MG PO TABS
1.0000 | ORAL_TABLET | Freq: Two times a day (BID) | ORAL | Status: DC
  Administered 2023-12-03 – 2023-12-05 (×5): 8.6 mg via ORAL
  Filled 2023-12-01 (×7): qty 1

## 2023-12-01 MED ORDER — ALBUTEROL SULFATE HFA 108 (90 BASE) MCG/ACT IN AERS: 2.0000 | INHALATION_SPRAY | Freq: Four times a day (QID) | RESPIRATORY_TRACT | Status: DC | PRN

## 2023-12-01 MED ORDER — CHLORHEXIDINE GLUCONATE CLOTH 2 % EX PADS
6.0000 | MEDICATED_PAD | Freq: Once | CUTANEOUS | Status: DC
Start: 2023-12-01 — End: 2023-12-01

## 2023-12-01 MED ORDER — EPHEDRINE SULFATE-NACL 50-0.9 MG/10ML-% IV SOSY
PREFILLED_SYRINGE | INTRAVENOUS | Status: DC | PRN
Start: 2023-12-01 — End: 2023-12-01
  Administered 2023-12-01 (×4): 5 mg via INTRAVENOUS

## 2023-12-01 MED ORDER — GLYCOPYRROLATE PF 0.2 MG/ML IJ SOSY
PREFILLED_SYRINGE | INTRAMUSCULAR | Status: AC
  Filled 2023-12-01: qty 1

## 2023-12-01 MED ORDER — THROMBIN 5000 UNITS EX KIT
PACK | CUTANEOUS | Status: AC
  Filled 2023-12-01: qty 1

## 2023-12-01 MED ORDER — VASOPRESSIN 20 UNIT/ML IV SOLN
INTRAVENOUS | Status: AC
  Filled 2023-12-01: qty 1

## 2023-12-01 MED ORDER — ACETAMINOPHEN 10 MG/ML IV SOLN
INTRAVENOUS | Status: DC | PRN
  Administered 2023-12-01: 1000 mg via INTRAVENOUS

## 2023-12-01 MED ORDER — ROSUVASTATIN CALCIUM 5 MG PO TABS
5.0000 mg | ORAL_TABLET | Freq: Every day | ORAL | Status: DC
  Administered 2023-12-01 – 2023-12-04 (×4): 5 mg via ORAL
  Filled 2023-12-01 (×5): qty 1

## 2023-12-01 MED ORDER — BUPIVACAINE HCL (PF) 0.5 % IJ SOLN
INTRAMUSCULAR | Status: AC
Start: 2023-12-01 — End: ?
  Filled 2023-12-01: qty 30

## 2023-12-01 MED ORDER — PHENYLEPHRINE 80 MCG/ML (10ML) SYRINGE FOR IV PUSH (FOR BLOOD PRESSURE SUPPORT)
PREFILLED_SYRINGE | INTRAVENOUS | Status: AC
  Filled 2023-12-01: qty 10

## 2023-12-01 MED ORDER — ONDANSETRON HCL 4 MG/2ML IJ SOLN: 4.0000 mg | Freq: Four times a day (QID) | INTRAMUSCULAR | Status: DC | PRN

## 2023-12-01 MED ORDER — LIDOCAINE-EPINEPHRINE 1 %-1:100000 IJ SOLN
INTRAMUSCULAR | Status: DC | PRN
  Administered 2023-12-01: 5 mL
  Administered 2023-12-01: 10 mL

## 2023-12-01 MED ORDER — KETOROLAC TROMETHAMINE 15 MG/ML IJ SOLN
7.5000 mg | Freq: Four times a day (QID) | INTRAMUSCULAR | Status: AC
Start: 2023-12-01 — End: 2023-12-02
  Administered 2023-12-01 – 2023-12-02 (×4): 7.5 mg via INTRAVENOUS
  Filled 2023-12-01 (×4): qty 1

## 2023-12-01 MED ORDER — NOREPINEPHRINE 4 MG/250ML-% IV SOLN
INTRAVENOUS | Status: AC
  Filled 2023-12-01: qty 250

## 2023-12-01 SURGICAL SUPPLY — 48 items
BAG COUNTER SPONGE SURGICOUNT (BAG) ×3 IMPLANT
BONE MATRIX OSTEOCEL PRO LRG (Bone Implant) IMPLANT
BONE MATRIX OSTEOCEL PRO MED (Bone Implant) IMPLANT
BUR 14 MATCH 3 (BUR) IMPLANT
BUR MR8 14 BALL 5 (BUR) IMPLANT
BURR 14 MATCH 3 (BUR) IMPLANT
BURR MR8 14 BALL 5 (BUR) IMPLANT
CAGE COROENT XL TI 8X18X45 (Cage) IMPLANT
CAGE MODULUS XL 10X18X50 - 10 (Cage) IMPLANT
COVERAGE SUPPORT O-ARM STEALTH (MISCELLANEOUS) ×3 IMPLANT
DERMABOND ADVANCED .7 DNX12 (GAUZE/BANDAGES/DRESSINGS) ×6 IMPLANT
DRAPE C-ARM 42X120 X-RAY (DRAPES) IMPLANT
DRAPE C-ARMOR (DRAPES) ×3 IMPLANT
DRAPE LAPAROTOMY 100X72X124 (DRAPES) ×3 IMPLANT
DRAPE SHEET LG 3/4 BI-LAMINATE (DRAPES) ×12 IMPLANT
ELECT NVM5 SURFACE MEP/EMG (ELECTRODE) IMPLANT
ELECT REM PT RETURN 9FT ADLT (ELECTROSURGICAL) ×6 IMPLANT
ELECTRODE REM PT RTRN 9FT ADLT (ELECTROSURGICAL) ×3 IMPLANT
FEE COVERAGE SUPPORT O-ARM (MISCELLANEOUS) ×3 IMPLANT
GAUZE SPONGE 4X4 12PLY STRL (GAUZE/BANDAGES/DRESSINGS) IMPLANT
GLOVE ECLIPSE 8.5 STRL (GLOVE) ×3 IMPLANT
GOWN STRL REUS W/ TWL LRG LVL3 (GOWN DISPOSABLE) IMPLANT
GOWN STRL REUS W/ TWL XL LVL3 (GOWN DISPOSABLE) ×6 IMPLANT
GOWN STRL REUS W/TWL 2XL LVL3 (GOWN DISPOSABLE) ×9 IMPLANT
GUIDEWIRE NITINOL BEVEL TIP (WIRE) IMPLANT
KIT DILATOR XLIF 5 (KITS) IMPLANT
KIT SURGICAL ACCESS MAXCESS 4 (KITS) IMPLANT
KIT TURNOVER KIT B (KITS) ×3 IMPLANT
MARKER SKIN DUAL TIP RULER LAB (MISCELLANEOUS) ×3 IMPLANT
MARKER SPHERE PSV REFLC NDI (MISCELLANEOUS) ×15 IMPLANT
MODULE NVM5 NEXT GEN EMG (NEUROSURGERY SUPPLIES) IMPLANT
NDL HYPO 25X1 1.5 SAFETY (NEEDLE) ×3 IMPLANT
NDL I-PASS III (NEEDLE) IMPLANT
NEEDLE HYPO 25X1 1.5 SAFETY (NEEDLE) ×6 IMPLANT
NEEDLE I-PASS III (NEEDLE) ×3 IMPLANT
PACK LAMINECTOMY NEURO (CUSTOM PROCEDURE TRAY) ×3 IMPLANT
PIN BONE FIX 100 (PIN) IMPLANT
ROD RELINE MAS LORD 5.5X95MM (Rod) IMPLANT
SCREW LOCK RELINE 5.5 TULIP (Screw) IMPLANT
SCREW RELINE MAS POLY 6.5X40MM (Screw) IMPLANT
SCREW RELINE RED 6.5X45MM POLY (Screw) IMPLANT
SUT VIC AB 1 CT1 18XBRD ANBCTR (SUTURE) ×3 IMPLANT
SUT VIC AB 3-0 SH 8-18 (SUTURE) ×3 IMPLANT
SUT VIC AB 4-0 RB1 18 (SUTURE) ×3 IMPLANT
TAPE CLOTH SURG 4X10 WHT LF (GAUZE/BANDAGES/DRESSINGS) IMPLANT
TOWEL GREEN STERILE (TOWEL DISPOSABLE) ×3 IMPLANT
TRAY FOLEY MTR SLVR 14FR STAT (SET/KITS/TRAYS/PACK) IMPLANT
TRAY FOLEY MTR SLVR 16FR STAT (SET/KITS/TRAYS/PACK) ×3 IMPLANT

## 2023-12-01 NOTE — Progress Notes (Signed)
 Orthopedic Tech Progress Note Patient Details:  Robin Chaney 1951-04-29 [id number]  Ortho Devices Type of Ortho Device: Lumbar corsett Ortho Device/Splint Location: BACK Ortho Device/Splint Interventions: Ordered   Post Interventions Patient Tolerated: Well Instructions Provided: Care of device  Donald Pore 12/01/2023, 5:07 PM

## 2023-12-01 NOTE — Anesthesia Preprocedure Evaluation (Addendum)
 Anesthesia Evaluation  Patient identified by MRN, date of birth, ID band Patient awake    Reviewed: Allergy & Precautions, NPO status , Patient's Chart, lab work & pertinent test results  Airway Mallampati: II  TM Distance: >3 FB Neck ROM: Full    Dental  (+) Teeth Intact, Caps, Dental Advisory Given   Pulmonary asthma , Recent URI , Resolved   breath sounds clear to auscultation       Cardiovascular negative cardio ROS  Rhythm:Regular Rate:Normal     Neuro/Psych  PSYCHIATRIC DISORDERS Anxiety Depression    negative neurological ROS     GI/Hepatic Neg liver ROS,GERD  ,,  Endo/Other  negative endocrine ROS    Renal/GU negative Renal ROS     Musculoskeletal  (+) Arthritis ,    Abdominal Normal abdominal exam  (+)   Peds  Hematology negative hematology ROS (+)   Anesthesia Other Findings   Reproductive/Obstetrics                             Anesthesia Physical Anesthesia Plan  ASA: 2  Anesthesia Plan: General   Post-op Pain Management: Ofirmev IV (intra-op)* and Dilaudid IV   Induction: Intravenous  PONV Risk Score and Plan: 4 or greater and Ondansetron, Dexamethasone, Midazolam and Treatment may vary due to age or medical condition  Airway Management Planned: Oral ETT  Additional Equipment: Arterial line  Intra-op Plan:   Post-operative Plan: Extubation in OR  Informed Consent: I have reviewed the patients History and Physical, chart, labs and discussed the procedure including the risks, benefits and alternatives for the proposed anesthesia with the patient or authorized representative who has indicated his/her understanding and acceptance.     Dental advisory given  Plan Discussed with: CRNA  Anesthesia Plan Comments:        Anesthesia Quick Evaluation

## 2023-12-01 NOTE — H&P (Signed)
 Robin Chaney is an 73 y.o. female.   Chief Complaint: Back and bilateral leg pain HPI: Patient is a 73 year old individual who has had significant back and bilateral leg pain.  She has evidence of advanced degenerative changes at L2 33445 with a evolving scoliosis.  She has been vies regarding the need for surgical decompression arthrodesis at L2 33445.  She is now admitted for that process being done via an indirect anterolateral decompression from the right side and posterior fixation.  Past Medical History:  Diagnosis Date   Allergic conjunctivitis    Allergic rhinitis    Anxiety    Arthritis    Asthma    Bronchitis    Chronic back pain    Depression    Dyspnea    asthma related - sob with exertion   GERD (gastroesophageal reflux disease)    no meds   HLD (hyperlipidemia)    Insomnia    Pneumonia    x several   Recurrent upper respiratory infection (URI)     Past Surgical History:  Procedure Laterality Date   AUGMENTATION MAMMAPLASTY     COLONOSCOPY  2018   TONSILLECTOMY AND ADENOIDECTOMY  1977   VIDEO BRONCHOSCOPY Bilateral 04/07/2018   Procedure: VIDEO BRONCHOSCOPY WITHOUT FLUORO;  Surgeon: Nyoka Cowden, MD;  Location: [facility] ENDOSCOPY;  Service: Cardiopulmonary;  Laterality: Bilateral;    Family History  Problem Relation Age of Onset   Emphysema Other        GF   Allergies Mother    Asthma Mother    Rheum arthritis Mother    Breast cancer Mother    Heart disease Mother    Allergies Sister    Lung cancer Father        was a smoker   Heart disease Father    Social History:  reports that she has never smoked. She has never used smokeless tobacco. She reports current alcohol use of about 4.0 standard drinks of alcohol per week. She reports that she does not use drugs.  Allergies:  Allergies  Allergen Reactions   Codeine Nausea Only    Medications Prior to Admission  Medication Sig Dispense Refill   ALPRAZolam (XANAX) 0.5 MG tablet TAKE 1 TABLET(0.5  MG) BY MOUTH TWICE DAILY AS NEEDED FOR ANXIETY 30 tablet 1   Budeson-Glycopyrrol-Formoterol (BREZTRI AEROSPHERE) 160-9-4.8 MCG/ACT AERO Inhale 2 puffs into the lungs in the morning and at bedtime. with spacer and rinse mouth afterwards. 3 each 2   Cholecalciferol (VITAMIN D3 PO) Take 1,000 Units by mouth at bedtime.     cyanocobalamin (VITAMIN B12) 1000 MCG tablet Take 1,000 mcg by mouth at bedtime.     escitalopram (LEXAPRO) 20 MG tablet TAKE 1 TABLET DAILY (Patient taking differently: Take 20 mg by mouth at bedtime.) 90 tablet 3   montelukast (SINGULAIR) 10 MG tablet TAKE 1 TABLET AT BEDTIME 90 tablet 0   rosuvastatin (CRESTOR) 5 MG tablet TAKE 1 TABLET DAILY (Patient taking differently: Take 5 mg by mouth at bedtime.) 90 tablet 3   zolpidem (AMBIEN) 10 MG tablet TAKE 1 TABLET AT BEDTIME 90 tablet 1   albuterol (VENTOLIN HFA) 108 (90 Base) MCG/ACT inhaler Inhale 2 puffs into the lungs every 6 (six) hours as needed for wheezing or shortness of breath. 54 g 3   fexofenadine (ALLEGRA) 180 MG tablet Take 180 mg by mouth daily as needed for allergies.     phentermine 15 MG capsule Take 1 capsule (15 mg total) by mouth every morning. (  Patient not taking: Reported on 11/21/2023) 30 capsule 0   tretinoin (RETIN-A) 0.1 % cream Apply to affected areas at bedtime as directed. (Patient taking differently: Apply 1 Application topically once a week.) 45 g 1    No results found for this or any previous visit (from the past 48 hours). No results found.  Review of Systems  Constitutional:  Positive for activity change.  Musculoskeletal:  Positive for back pain, gait problem and myalgias.  All other systems reviewed and are negative.   Pulse (P) 74, temperature (P) 97.8 F (36.6 C), temperature source (P) Oral, resp. rate (P) 17, height 5\' 4"  (1.626 m), weight 71.3 kg, SpO2 (P) 95%. Physical Exam Constitutional:      Appearance: Normal appearance.  HENT:     Head: Normocephalic and atraumatic.     Right  Ear: Tympanic membrane, ear canal and external ear normal.     Left Ear: Tympanic membrane, ear canal and external ear normal.     Nose: Nose normal.     Mouth/Throat:     Mouth: Mucous membranes are moist.     Pharynx: Oropharynx is clear.  Eyes:     Extraocular Movements: Extraocular movements intact.     Conjunctiva/sclera: Conjunctivae normal.     Pupils: Pupils are equal, round, and reactive to light.  Cardiovascular:     Rate and Rhythm: Normal rate and regular rhythm.     Pulses: Normal pulses.     Heart sounds: Normal heart sounds.  Pulmonary:     Effort: Pulmonary effort is normal.     Breath sounds: Normal breath sounds.  Abdominal:     General: Abdomen is flat. Bowel sounds are normal.     Palpations: Abdomen is soft.  Musculoskeletal:     Cervical back: Normal range of motion and neck supple.     Comments: Positive straight leg raising at 30 degrees in either lower extremity Patrick's maneuver is negative bilaterally.  Skin:    General: Skin is warm and dry.     Capillary Refill: Capillary refill takes less than 2 seconds.  Neurological:     General: No focal deficit present.     Mental Status: She is alert and oriented to person, place, and time. Mental status is at baseline.  Psychiatric:        Mood and Affect: Mood normal.        Behavior: Behavior normal.        Thought Content: Thought content normal.        Judgment: Judgment normal.      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  Adult onset scoliosis of the lumbar spine.  Plan: Anterolateral decompression L2-3 L3-4 L4-5. Posterior stabilization with pedicle screw fixation L2-L5.  Stefani Dama, MD 12/01/2023, 7:41 AM

## 2023-12-01 NOTE — Op Note (Signed)
 Date of surgery: 12/01/2023 Preoperative diagnosis: Lumbar spondylosis and stenosis L2-3 L3-4 L4-5.  Degenerative scoliosis lumbar spine. Postoperative diagnosis: Same Procedure: Anterolateral decompression L2-3 L3-4 and L4-5 with indirect technique using XLIF spacers L2-3 L3-4 L4-5. Posterior stabilization L2-L5 using O-arm navigation and EMG neuromonitoring. Surgeon: Barnett Abu First Assistant: Marikay Alar, MD Anesthesia: General endotracheal Indications: Robin Chaney is a 73 year old individuals had significant back and bilateral lower extremity pain.  She has developed process aggressively worsening spondylitic stenosis with a degenerative scoliosis.  She has failed all manner of conservative therapy and has been very actively engaged in the physical therapy rehabilitation program.  Despite this she has had progressive worsening radicular symptoms.  She been advised regarding surgery.  Procedure: Patient was brought to the operating room supine on the stretcher.  After the smooth induction of general endotracheal anesthesia, she was carefully turned onto her side.  The bony prominences were appropriately padded and protected.  EMG monitoring was applied to the major muscle groups of the lumbar plexus.  The patient's right side was placed upright.  Then using fluoroscopic guidance we localized L2 33445 and marked skin entry sites on the right side.  Then the skin was prepped with alcohol DuraPrep and draped in a sterile fashion.  The first incision was created at L4-L5 and the dissection was taken down through the fascia.  Then a blunt tipped probe was passed through the lateral aspect of the fascia into the retroperitoneal space and guided over the midportion of the space at L4-L5.  EMG monitoring was performed to make sure no elements of the lumbar plexus were involved.  When this was verified a K wire was passed into the disc space at L4-L5.  Then the initial probe had several dilators passed  around and ultimately 130 mm retractor was placed over the outer dilator.  Each time we stimulated in the vicinity of the lumbar plexus to make sure no neural elements were nearby.  When this was verified a shim was then placed into the disc base at L4-L5.  The light sources were attached in the retractor was opened to expose the lateral aspect of the space at L4-L5.  A 15 blade was used to open at this space and then remove a modest quantity of severely degenerated desiccated disc material.  A series of dilators 4 mm 6 mm and 8 mm were passed into the disc space.  Checking the anterior posterior dimension and was felt that no bigger than an 18 mm spacer would be able to be placed and at L4-5 we are able to dilate to a 10 mm height with an 18 x 5010 degree lordotic spacer being filled with Osteocel and then packed into the interspace.  The interspace Spaete spacer was then placed into the interspace under fluoroscopic visualization.  When this was adequately placed the retractor was removed and attention was turned to L3-4 here a similar process was carried out and there was a minimal amount of disc that was recovered from this the space the endplates were prepared properly and then ultimately the space was dilated to 10 mm height and a 10 x 18 x 50 mm spacer with 10 degrees lordosis was placed into this interval.  This was also filled with Osteocel.  At L2-L3 we started to dilate with an 8 mm dilator for and then we switched to a 10 mm dilator however it became apparent that the dilator was kicked back into the vertebral canal and it was removed  with partial insertion.  It was noted that there was some erosion into the vertebral body of L2 superiorly the ventral aspect of the disc space was then cleared and ultimately was felt that an 8 x 18 mm spacer 45 mm in width with no lordosis would fit best into this interval.  This was placed into the interval and then the retractors were removed.  Hemostasis was checked  and the soft tissues and the deep fascia was closed with 2-0 Vicryl in interrupted fashion and 3-0 Vicryl and 4-0 Vicryl was used in the subcuticular skin.  Dermabond was placed on the dressings.  The patient was then turned from the lateral decubitus position to the prone position on the [place] table.  The back was prepped with alcohol DuraPrep and draped in a sterile fashion.  Over the right posterior suprailiac crest and iliac crest pin was placed to secure an array for use with the O-arm.  The O-arm array was placed and then a spin was performed.  Using the O-arm images we then navigated screws into L2-L3-L4 and L5 using standard O-arm technique.  We also used neuromonitoring and during the placement of the screws no evidence of any cut out was noted while placing the screws with no evidence of any increased EMG activity.  Ultimately 4 screws were placed on each side each measuring 6.5 x 45 mm the EMG recordings remain silent during this period of time.  Rod length was measured and was felt that 95 mm long precontoured rods could be placed through the rod heads in a neutral construct.  This was performed and then a final radiograph was obtained.  The radiograph however demonstrated that at L2 it appeared that the right sided screw was placed lateral to the pedicle and the left sided screw was placed medial to the pedicle this imaging was confirmed with an O-arm spin.  Is then decided to loosen the hardware replaced towers from L2-L5 and removed the L2 screws individually And ReUsing a Jamshidi Needle into the L2 Vertebrae and Fluoroscopic Guidance.  This Was Also Done with EMG Monitoring.  This Was Done through the Same Access Incisions Which Were Enlarged Slightly to Allow Use of a Metrix Tubes and Retractors to Place the [facility] Back onto the Screws.  This Was First Done on the Left Side with the Screw Being Replaced with a 6.5 x 40 Mm Cannulated Screw on the Left Side and a 6.5 x 40 Mm Cannulated Screw on  the Right Side.  Once the Screws Were Placed They Were Read Tightened in a Neutral Construct with the Same Rods.  Final Imaging Identified Good Alignment.  With This Then the Incisions Were Checked for Hemostasis and Were Closed Sequentially Using 2-0 Vicryl and 3-0 Vicryl and 4-0 Vicryl in the Subcuticular Skin Dermabond Was Placed on the Skin.  The Patient Was Returned to Recovery Room in Stable Condition.

## 2023-12-01 NOTE — Anesthesia Procedure Notes (Signed)
 Arterial Line Insertion Start/End2/24/2025 8:15 AM, 12/01/2023 8:19 AM Performed by: Yolonda Kida, CRNA, CRNA  Patient location: OR. Preanesthetic checklist: patient identified, site marked, risks and benefits discussed, surgical consent, monitors and equipment checked, pre-op evaluation and timeout performed Patient sedated Right, radial was placed Catheter size: 20 G Hand hygiene performed  and maximum sterile barriers used  Allen's test indicative of satisfactory collateral circulation Attempts: 1 Procedure performed without using ultrasound guided technique. Following insertion, dressing applied and Biopatch. Post procedure assessment: normal  Patient tolerated the procedure well with no immediate complications.

## 2023-12-01 NOTE — Transfer of Care (Signed)
 Immediate Anesthesia Transfer of Care Note  Patient: Robin Chaney  Procedure(s) Performed: LUMBAR TWO-THREE, LUMBAR THREE-FOUR, LUMBAR FOUR-FIVE ANTERIOR LATERAL INTERBODY FUSION (Right: Spine Lumbar) LUMBAR TWO-THREE, LUMBAR THREE-FOUR, LUMBAR FOUR-FIVE PERCUTANEOUS PEDICLE SCREW PLACEMENT (Spine Lumbar) Application of O-Arm  Patient Location: PACU  Anesthesia Type:General  Level of Consciousness: awake, alert , and oriented  Airway & Oxygen Therapy: Patient Spontanous Breathing and Patient connected to face mask  Post-op Assessment: Report given to RN and Post -op Vital signs reviewed and stable  Post vital signs: Reviewed and stable  Last Vitals:  Vitals Value Taken Time  BP 120/62 12/01/23 1632  Temp    Pulse 72 12/01/23 1638  Resp 16 12/01/23 1638  SpO2 96 % 12/01/23 1638  Vitals shown include unfiled device data.  Last Pain:  Vitals:   12/01/23 0624  TempSrc:   PainSc: 3       Patients Stated Pain Goal: 3 (12/01/23 1610)  Complications: No notable events documented.

## 2023-12-02 ENCOUNTER — Encounter (HOSPITAL_COMMUNITY): Payer: Self-pay | Admitting: Neurological Surgery

## 2023-12-02 LAB — BASIC METABOLIC PANEL
Anion gap: 6 (ref 5–15)
BUN: 11 mg/dL (ref 8–23)
CO2: 22 mmol/L (ref 22–32)
Calcium: 8.3 mg/dL — ABNORMAL LOW (ref 8.9–10.3)
Chloride: 106 mmol/L (ref 98–111)
Creatinine, Ser: 0.72 mg/dL (ref 0.44–1.00)
GFR, Estimated: >60 mL/min (ref >60)
Glucose, Bld: 143 mg/dL — ABNORMAL HIGH (ref 70–99)
Potassium: 3.1 mmol/L — ABNORMAL LOW (ref 3.5–5.1)
Sodium: 134 mmol/L — ABNORMAL LOW (ref 135–145)

## 2023-12-02 LAB — POCT I-STAT 7, (LYTES, BLD GAS, ICA,H+H)
Acid-base deficit: 4 mmol/L — ABNORMAL HIGH (ref 0.0–2.0)
Bicarbonate: 22.9 mmol/L (ref 20.0–28.0)
Calcium, Ion: 1.15 mmol/L (ref 1.15–1.40)
HCT: 33 % — ABNORMAL LOW (ref 36.0–46.0)
Hemoglobin: 11.2 g/dL — ABNORMAL LOW (ref 12.0–15.0)
O2 Saturation: 99 %
Potassium: 4.1 mmol/L (ref 3.5–5.1)
Sodium: 143 mmol/L (ref 135–145)
TCO2: 24 mmol/L (ref 22–32)
pCO2 arterial: 46.5 mmHg (ref 32–48)
pH, Arterial: 7.301 — ABNORMAL LOW (ref 7.35–7.45)
pO2, Arterial: 181 mmHg — ABNORMAL HIGH (ref 83–108)

## 2023-12-02 LAB — CBC
HCT: 31.5 % — ABNORMAL LOW (ref 36.0–46.0)
Hemoglobin: 10.4 g/dL — ABNORMAL LOW (ref 12.0–15.0)
MCH: 30.6 pg (ref 26.0–34.0)
MCHC: 33 g/dL (ref 30.0–36.0)
MCV: 92.6 fL (ref 80.0–100.0)
Platelets: 225 10*3/uL (ref 150–400)
RBC: 3.4 MIL/uL — ABNORMAL LOW (ref 3.87–5.11)
RDW: 13.2 % (ref 11.5–15.5)
WBC: 5.7 10*3/uL (ref 4.0–10.5)
nRBC: 0 % (ref 0.0–0.2)

## 2023-12-02 MED ORDER — DEXAMETHASONE SODIUM PHOSPHATE 10 MG/ML IJ SOLN
10.0000 mg | Freq: Once | INTRAMUSCULAR | Status: AC
  Administered 2023-12-02: 10 mg via INTRAVENOUS
  Filled 2023-12-02: qty 1

## 2023-12-02 MED ORDER — GABAPENTIN 100 MG PO CAPS
100.0000 mg | ORAL_CAPSULE | Freq: Three times a day (TID) | ORAL | Status: DC
  Administered 2023-12-02 – 2023-12-03 (×4): 100 mg via ORAL
  Filled 2023-12-02 (×4): qty 1

## 2023-12-02 MED ORDER — HYDROMORPHONE HCL 1 MG/ML IJ SOLN
1.0000 mg | INTRAMUSCULAR | Status: DC | PRN
  Administered 2023-12-02 – 2023-12-04 (×3): 1 mg via INTRAVENOUS
  Filled 2023-12-02 (×4): qty 1

## 2023-12-02 MED ORDER — HYDROMORPHONE HCL 1 MG/ML IJ SOLN
0.5000 mg | Freq: Once | INTRAMUSCULAR | Status: AC
  Administered 2023-12-02: 0.5 mg via INTRAVENOUS
  Filled 2023-12-02: qty 0.5

## 2023-12-02 MED ORDER — DEXAMETHASONE 4 MG PO TABS
2.0000 mg | ORAL_TABLET | Freq: Two times a day (BID) | ORAL | Status: DC
  Administered 2023-12-02 – 2023-12-05 (×7): 2 mg via ORAL
  Filled 2023-12-02 (×7): qty 1

## 2023-12-02 MED FILL — Sodium Chloride IV Soln 0.9%: INTRAVENOUS | Qty: 1000 | Status: AC

## 2023-12-02 MED FILL — Heparin Sodium (Porcine) Inj 1000 Unit/ML: INTRAMUSCULAR | Qty: 30 | Status: AC

## 2023-12-02 NOTE — Evaluation (Signed)
 Occupational Therapy Evaluation Patient Details Name: Robin Chaney MRN: [id number] DOB: Sep 29, 1951 Today's Date: 12/02/2023   History of Present Illness   Pt is a 73 y/o female presenting on 2/24 for same day anterolateral decompression L 2-3, L3-4. L4-5 and posterior stabilization with pedicle screw fixation L2-5. PMH includes: anxiety, arthritis, chronic back pain, depression, insomnia.     Clinical Impressions Patient admitted for above and presents with problem list below.  PTA pt was independent and driving. Patient was educated on brace mgmt and wear schedule, back precautions, ADL compensatory techniques, AE/DME, mobility progression, safety and recommendations.  Today, pt demonstrated ability to complete bed mobility with min assist, transfers using RW with min assist, functional mobility using RW with min assist, and ADLs with setup to max assist.  She is limited by pain in R LE and back, anxious during mobility due to recent fall.  At discharge, pt will have support from family/friends as needed.  Based on performance today, believe pt will best benefit from continued OT services acutely and after dc at an inpatient setting with >3hrs/day to optimize independence, safety and return to PLOF with ADLs and mobility.      If plan is discharge home, recommend the following:   A lot of help with bathing/dressing/bathroom;A little help with walking and/or transfers;Assistance with cooking/housework;Assist for transportation;Help with stairs or ramp for entrance     Functional Status Assessment   Patient has had a recent decline in their functional status and demonstrates the ability to make significant improvements in function in a reasonable and predictable amount of time.     Equipment Recommendations   Tub/shower seat     Recommendations for Other Services   Rehab consult     Precautions/Restrictions   Precautions Precautions: Fall Recall of  Precautions/Restrictions: Intact Precaution/Restrictions Comments: reviewed precautions, min cueing to adhere functionally Required Braces or Orthoses: Spinal Brace Spinal Brace: Applied in sitting position Restrictions Weight Bearing Restrictions Per Provider Order: No     Mobility Bed Mobility Overal bed mobility: Needs Assistance Bed Mobility: Rolling, Sidelying to Sit, Sit to Sidelying Rolling: Contact guard assist Sidelying to sit: Contact guard assist     Sit to sidelying: Min assist General bed mobility comments: cueing for technique, min assist for R LE back to supine    Transfers Overall transfer level: Needs assistance Equipment used: Rolling walker (2 wheels) Transfers: Sit to/from Stand Sit to Stand: Min assist           General transfer comment: hand placement and power up to steady, cueing for posture      Balance Overall balance assessment: Needs assistance Sitting-balance support: No upper extremity supported, Feet supported Sitting balance-Leahy Scale: Fair     Standing balance support: Bilateral upper extremity supported, During functional activity, Single extremity supported Standing balance-Leahy Scale: Poor Standing balance comment: relies on BUE and external support                           ADL either performed or assessed with clinical judgement   ADL Overall ADL's : Needs assistance/impaired     Grooming: Set up;Sitting           Upper Body Dressing : Minimal assistance;Sitting Upper Body Dressing Details (indicate cue type and reason): brace mgmt Lower Body Dressing: Maximal assistance;Sit to/from stand Lower Body Dressing Details (indicate cue type and reason): pt relies on BUE support in standing, assist required for socks Toilet Transfer: Minimal  assistance;Ambulation;Rolling walker (2 wheels)   Toileting- Clothing Manipulation and Hygiene: Moderate assistance;Sit to/from stand       Functional mobility during  ADLs: Minimal assistance;Rolling walker (2 wheels);Cueing for safety;Cueing for sequencing       Vision   Vision Assessment?: No apparent visual deficits     Perception         Praxis         Pertinent Vitals/Pain Pain Assessment Pain Assessment: Faces Faces Pain Scale: Hurts even more Pain Location: back, R LE Pain Descriptors / Indicators: Discomfort, Grimacing, Guarding Pain Intervention(s): Limited activity within patient's tolerance, Monitored during session, Repositioned     Extremity/Trunk Assessment Upper Extremity Assessment Upper Extremity Assessment: Overall WFL for tasks assessed   Lower Extremity Assessment Lower Extremity Assessment: Defer to PT evaluation   Cervical / Trunk Assessment Cervical / Trunk Assessment: Back Surgery   Communication Communication Communication: No apparent difficulties   Cognition Arousal: Alert Behavior During Therapy: Anxious Cognition: No apparent impairments             OT - Cognition Comments: pt anxious with some slow processing, anticipate due to fatigue and meds                 Following commands: Intact       Cueing  General Comments   Cueing Techniques: Verbal cues      Exercises     Shoulder Instructions      Home Living Family/patient expects to be discharged to:: Private residence Living Arrangements: Alone Available Help at Discharge: Family;Available 24 hours/day;Friend(s) Type of Home: House Home Access: Stairs to enter     Home Layout: One level     Bathroom Shower/Tub: Producer, television/film/video: Standard     Home Equipment: Agricultural consultant (2 wheels);BSC/3in1          Prior Functioning/Environment Prior Level of Function : Independent/Modified Independent;Driving                    OT Problem List: Decreased activity tolerance;Impaired balance (sitting and/or standing);Decreased knowledge of use of DME or AE;Decreased knowledge of precautions;Pain    OT Treatment/Interventions: Self-care/ADL training;Therapeutic exercise;DME and/or AE instruction;Therapeutic activities;Patient/family education;Balance training      OT Goals(Current goals can be found in the care plan section)   Acute Rehab OT Goals Patient Stated Goal: get back to independent OT Goal Formulation: With patient Time For Goal Achievement: 12/16/23 Potential to Achieve Goals: Good   OT Frequency:  Min 1X/week    Co-evaluation              AM-PAC OT "6 Clicks" Daily Activity     Outcome Measure Help from another person eating meals?: None Help from another person taking care of personal grooming?: A Little Help from another person toileting, which includes using toliet, bedpan, or urinal?: A Lot Help from another person bathing (including washing, rinsing, drying)?: A Lot Help from another person to put on and taking off regular upper body clothing?: A Little Help from another person to put on and taking off regular lower body clothing?: A Lot 6 Click Score: 16   End of Session Equipment Utilized During Treatment: Gait belt;Rolling walker (2 wheels);Back brace Nurse Communication: Mobility status  Activity Tolerance: Patient tolerated treatment well Patient left: in bed;with call bell/phone within reach;with nursing/sitter in room  OT Visit Diagnosis: Other abnormalities of gait and mobility (R26.89);Pain Pain - Right/Left: Right Pain - part of body: Leg (back)  Time: 1914-7829 OT Time Calculation (min): 27 min Charges:  OT General Charges $OT Visit: 1 Visit OT Evaluation $OT Eval Low Complexity: 1 Low OT Treatments $Self Care/Home Management : 8-22 mins  Barry Brunner, OT Acute Rehabilitation Services Office [phone] Secure Chat Preferred    Chancy Milroy 12/02/2023, 9:59 AM

## 2023-12-02 NOTE — Anesthesia Postprocedure Evaluation (Signed)
 Anesthesia Post Note  Patient: Robin Chaney  Procedure(s) Performed: LUMBAR TWO-THREE, LUMBAR THREE-FOUR, LUMBAR FOUR-FIVE ANTERIOR LATERAL INTERBODY FUSION (Right: Spine Lumbar) LUMBAR TWO-THREE, LUMBAR THREE-FOUR, LUMBAR FOUR-FIVE PERCUTANEOUS PEDICLE SCREW PLACEMENT (Spine Lumbar) Application of O-Arm     Patient location during evaluation: PACU Anesthesia Type: General Level of consciousness: awake and alert Pain management: pain level controlled Vital Signs Assessment: post-procedure vital signs reviewed and stable Respiratory status: spontaneous breathing, nonlabored ventilation, respiratory function stable and patient connected to nasal cannula oxygen Cardiovascular status: blood pressure returned to baseline and stable Postop Assessment: no apparent nausea or vomiting Anesthetic complications: no   No notable events documented.              Shelton Silvas

## 2023-12-02 NOTE — Evaluation (Signed)
 Physical Therapy Evaluation  Patient Details Name: Robin Chaney MRN: [id number] DOB: 01-08-1951 Today's Date: 12/02/2023  History of Present Illness  Pt is a 73 y/o female presenting on 2/24 for same day anterolateral decompression L 2-3, L3-4. L4-5 and posterior stabilization with pedicle screw fixation L2-5. PMH includes: anxiety, arthritis, chronic back pain, depression, insomnia.   Clinical Impression  Pt admitted with above diagnosis. At the time of PT eval, pt was able to demonstrate transfers and ambulation with gross CGA to min assist and RW for support. Pt was educated on precautions, brace application/wearing schedule, appropriate activity progression, and car transfer. Pt currently with functional limitations due to the deficits listed below (see PT Problem List). Pt will benefit from skilled PT to increase their independence and safety with mobility to allow discharge to the venue listed below.         If plan is discharge home, recommend the following: A little help with walking and/or transfers;A little help with bathing/dressing/bathroom;Assistance with cooking/housework;Help with stairs or ramp for entrance;Assist for transportation   Can travel by private vehicle        Equipment Recommendations None recommended by PT  Recommendations for Other Services       Functional Status Assessment Patient has had a recent decline in their functional status and demonstrates the ability to make significant improvements in function in a reasonable and predictable amount of time.     Precautions / Restrictions Precautions Precautions: Fall Recall of Precautions/Restrictions: Intact Precaution/Restrictions Comments: reviewed precautions, min cueing to adhere functionally Required Braces or Orthoses: Spinal Brace Spinal Brace: Applied in sitting position Restrictions Weight Bearing Restrictions Per Provider Order: No      Mobility  Bed Mobility Overal bed mobility:  Needs Assistance Bed Mobility: Rolling, Sidelying to Sit, Sit to Sidelying Rolling: Contact guard assist Sidelying to sit: Contact guard assist     Sit to sidelying: Min assist General bed mobility comments: Assist for LE elevation back up in bed at end of session. HOB elevated for supine>sit. VC's throughout for optimal log roll technique.    Transfers Overall transfer level: Needs assistance Equipment used: Rolling walker (2 wheels) Transfers: Sit to/from Stand Sit to Stand: Contact guard assist           General transfer comment: VC's for hand placement on seated surface for safety.    Ambulation/Gait Ambulation/Gait assistance: Contact guard assist Gait Distance (Feet): 100 Feet Assistive device: Rolling walker (2 wheels) Gait Pattern/deviations: Step-through pattern, Decreased stride length, Trunk flexed Gait velocity: Decreased Gait velocity interpretation: 1.31 - 2.62 ft/sec, indicative of limited community ambulator   General Gait Details: Favoring RLE with decreased step/stride length and low floor clearance. Pain and fatigue limiting distance.  Stairs            Wheelchair Mobility     Tilt Bed    Modified Rankin (Stroke Patients Only)       Balance Overall balance assessment: Needs assistance Sitting-balance support: No upper extremity supported, Feet supported Sitting balance-Leahy Scale: Fair     Standing balance support: Bilateral upper extremity supported, During functional activity, Single extremity supported Standing balance-Leahy Scale: Poor Standing balance comment: relies on BUE and external support                             Pertinent Vitals/Pain Pain Assessment Pain Assessment: Faces Faces Pain Scale: Hurts even more Pain Location: back, R LE Pain Descriptors / Indicators: Discomfort,  Grimacing, Guarding Pain Intervention(s): Limited activity within patient's tolerance, Monitored during session, Repositioned     Home Living Family/patient expects to be discharged to:: Private residence Living Arrangements: Alone Available Help at Discharge: Family;Available 24 hours/day;Friend(s) Type of Home: House Home Access: Stairs to enter       Home Layout: One level Home Equipment: Agricultural consultant (2 wheels);BSC/3in1      Prior Function Prior Level of Function : Independent/Modified Independent;Driving                     Extremity/Trunk Assessment   Upper Extremity Assessment Upper Extremity Assessment: Defer to OT evaluation    Lower Extremity Assessment Lower Extremity Assessment: RLE deficits/detail RLE Deficits / Details: Acute pain, decreased strength and quad activation post-op. Pt reports knee buckling, resulting in a fall earlier this morning (2/25)    Cervical / Trunk Assessment Cervical / Trunk Assessment: Back Surgery  Communication   Communication Communication: No apparent difficulties    Cognition Arousal: Alert Behavior During Therapy: Anxious                             Following commands: Intact       Cueing Cueing Techniques: Verbal cues     General Comments General comments (skin integrity, edema, etc.): Pt with fall this morning. MD cleared pt to participate with therapies    Exercises     Assessment/Plan    PT Assessment Patient needs continued PT services  PT Problem List Decreased strength;Decreased activity tolerance;Decreased balance;Decreased mobility;Decreased knowledge of use of DME;Decreased safety awareness;Decreased knowledge of precautions;Pain       PT Treatment Interventions DME instruction;Gait training;Functional mobility training;Stair training;Therapeutic activities;Therapeutic exercise;Balance training;Neuromuscular re-education;Patient/family education    PT Goals (Current goals can be found in the Care Plan section)  Acute Rehab PT Goals Patient Stated Goal: Feel better, go home PT Goal Formulation: With  patient Time For Goal Achievement: 12/16/23 Potential to Achieve Goals: Good    Frequency Min 1X/week     Co-evaluation               AM-PAC PT "6 Clicks" Mobility  Outcome Measure Help needed turning from your back to your side while in a flat bed without using bedrails?: A Little Help needed moving from lying on your back to sitting on the side of a flat bed without using bedrails?: A Little Help needed moving to and from a bed to a chair (including a wheelchair)?: A Little Help needed standing up from a chair using your arms (e.g., wheelchair or bedside chair)?: A Little Help needed to walk in hospital room?: A Little Help needed climbing 3-5 steps with a railing? : A Little 6 Click Score: 18    End of Session Equipment Utilized During Treatment: Gait belt;Back brace Activity Tolerance: Patient tolerated treatment well Patient left: in bed;with call bell/phone within reach Nurse Communication: Mobility status PT Visit Diagnosis: Unsteadiness on feet (R26.81);Pain Pain - part of body:  (back)    Time: 1610-9604 PT Time Calculation (min) (ACUTE ONLY): 26 min   Charges:   PT Evaluation $PT Eval Low Complexity: 1 Low PT Treatments $Gait Training: 8-22 mins PT General Charges $$ ACUTE PT VISIT: 1 Visit         Conni Slipper, PT, DPT Acute Rehabilitation Services Secure Chat Preferred Office: [phone]   Marylynn Pearson 12/02/2023, 10:23 AM

## 2023-12-02 NOTE — Progress Notes (Signed)
 Patient alert and oriented, void, ambulate. Patient fell  this am while in the bathroom trying to brush her teeth , small abrasion to R knee./tigh V/S stable, MD notified. Report given to [facility] RN. Patient transfer to 4N per order, and awaiting CIR.

## 2023-12-02 NOTE — Progress Notes (Signed)
 Inpatient Rehabilitation Admissions Coordinator   Case discussed with Dr Riley Kill and I met with patient at bedside. Patietn lacks the medical neccesity for CIR level hospital rehab. She states she has family that can provide assistance at home. I discussed HH vs SNF and she states preference for home. I will alert acute team and TOC and sign off.  Ottie Glazier, RN, MSN Rehab Admissions Coordinator [phone] 12/02/2023 11:47 AM

## 2023-12-02 NOTE — Progress Notes (Signed)
   12/02/23 1016  Vitals  Temp 97.7 F (36.5 C)  Temp Source Oral  BP (!) 141/67  MAP (mmHg) 88  BP Location Left Arm  BP Method Automatic  Patient Position (if appropriate) Lying  Pulse Rate 80  Pulse Rate Source Monitor  Resp 16  Level of Consciousness  Level of Consciousness Alert  MEWS COLOR  MEWS Score Color Green  Oxygen Therapy  SpO2 94 %  O2 Device Room Air  Pain Assessment  Pain Scale 0-10  Pain Score 3  Pain Type Surgical pain  Pain Location Back  Pain Orientation Lower  Pain Descriptors / Indicators Aching  Pain Frequency Intermittent  Pain Onset With Activity  Pain Intervention(s) Repositioned;Emotional support;Relaxation;Rest  Complaints & Interventions  Neuro symptoms relieved by Rest  Hot/Cold Interventions  Hot/Cold Interventions Warm blankets  PCA/Epidural/Spinal Assessment  Respiratory Pattern Regular;Unlabored;Symmetrical  IS - Achieved (mL) (RN, NT, or RT) 1750 mL  Glasgow Coma Scale  Eye Opening 4  Best Verbal Response (NON-intubated) 5  Best Motor Response 6  Glasgow Coma Scale Score 15  MEWS Score  MEWS Temp 0  MEWS Systolic 0  MEWS Pulse 0  MEWS RR 0  MEWS LOC 0  MEWS Score 0    Pt transferred from [facility]. Pt A&Ox4, MAEx4. Gauze dressing to posterior back with old drainage. Gauze dressing to right flank clean, dry, and intact. Skin assessed with second RN per hospital policy with no pressure injuries noted. Pt oriented to unit, room, and call light system. Bed in lowest position with bed alarm on.

## 2023-12-02 NOTE — Progress Notes (Signed)
 Patient ID: Robin Chaney, female   DOB: 1951/04/04, 73 y.o.   MRN: [id number] Vital signs are stable Patient's right lower extremity is weak with 2 out of 5 iliopsoas and 2 out of 5 quadricep on the right side her motor function appears to be intact she does require significant physical therapy and Occupational Therapy however she is too good for acute inpatient rehabilitation.  Patient does live alone and I believe that she can have a couple days of therapy here which would be greatly helpful pain is also an issue in that right side.  Will see how we can manage this with the addition of gabapentin although she feels very loopy at this time she is only on 100 mg 3 times daily.  Will start her also on some low-dose steroid.

## 2023-12-02 NOTE — Plan of Care (Signed)
  Problem: Education: Goal: Knowledge of General Education information will improve Description: Including pain rating scale, medication(s)/side effects and non-pharmacologic comfort measures Outcome: Progressing   Problem: Health Behavior/Discharge Planning: Goal: Ability to manage health-related needs will improve Outcome: Progressing   Problem: Clinical Measurements: Goal: Will remain free from infection Outcome: Progressing Goal: Diagnostic test results will improve Outcome: Progressing Goal: Respiratory complications will improve Outcome: Progressing   Problem: Activity: Goal: Risk for activity intolerance will decrease Outcome: Progressing   Problem: Nutrition: Goal: Adequate nutrition will be maintained Outcome: Progressing   Problem: Coping: Goal: Level of anxiety will decrease Outcome: Progressing   Problem: Elimination: Goal: Will not experience complications related to urinary retention Outcome: Progressing   Problem: Pain Managment: Goal: General experience of comfort will improve and/or be controlled Outcome: Progressing   Problem: Safety: Goal: Ability to remain free from injury will improve Outcome: Progressing

## 2023-12-03 MED ORDER — GABAPENTIN 300 MG PO CAPS
300.0000 mg | ORAL_CAPSULE | Freq: Three times a day (TID) | ORAL | Status: DC
  Administered 2023-12-03 – 2023-12-05 (×6): 300 mg via ORAL
  Filled 2023-12-03 (×6): qty 1

## 2023-12-03 NOTE — Care Management Important Message (Signed)
 Important Message  Patient Details  Name: Robin Chaney MRN: [id number] Date of Birth: Nov 05, 1950   Important Message Given:  Yes - Medicare IM     Sherilyn Banker 12/03/2023, 11:03 AM

## 2023-12-03 NOTE — Plan of Care (Signed)
 Problem: Education: Goal: Knowledge of General Education information will improve Description: Including pain rating scale, medication(s)/side effects and non-pharmacologic comfort measures Outcome: Progressing   Problem: Health Behavior/Discharge Planning: Goal: Ability to manage health-related needs will improve Outcome: Progressing   Problem: Clinical Measurements: Goal: Ability to maintain clinical measurements within normal limits will improve Outcome: Progressing Goal: Will remain free from infection Outcome: Progressing Goal: Diagnostic test results will improve Outcome: Progressing Goal: Respiratory complications will improve Outcome: Progressing Goal: Cardiovascular complication will be avoided Outcome: Progressing   Problem: Activity: Goal: Risk for activity intolerance will decrease Outcome: Progressing   Problem: Nutrition: Goal: Adequate nutrition will be maintained Outcome: Progressing   Problem: Coping: Goal: Level of anxiety will decrease Outcome: Progressing   Problem: Elimination: Goal: Will not experience complications related to bowel motility Outcome: Progressing Goal: Will not experience complications related to urinary retention Outcome: Progressing   Problem: Pain Managment: Goal: General experience of comfort will improve and/or be controlled Outcome: Progressing   Problem: Safety: Goal: Ability to remain free from injury will improve Outcome: Progressing   Problem: Skin Integrity: Goal: Risk for impaired skin integrity will decrease Outcome: Progressing   Problem: Education: Goal: Ability to verbalize activity precautions or restrictions will improve Outcome: Progressing Goal: Knowledge of the prescribed therapeutic regimen will improve Outcome: Progressing Goal: Understanding of discharge needs will improve Outcome: Progressing   Problem: Activity: Goal: Ability to avoid complications of mobility impairment will  improve Outcome: Progressing Goal: Ability to tolerate increased activity will improve Outcome: Progressing Goal: Will remain free from falls Outcome: Progressing   Problem: Bowel/Gastric: Goal: Gastrointestinal status for postoperative course will improve Outcome: Progressing   Problem: Clinical Measurements: Goal: Ability to maintain clinical measurements within normal limits will improve Outcome: Progressing Goal: Postoperative complications will be avoided or minimized Outcome: Progressing Goal: Diagnostic test results will improve Outcome: Progressing   Problem: Pain Management: Goal: Pain level will decrease Outcome: Progressing   Problem: Skin Integrity: Goal: Will show signs of wound healing Outcome: Progressing   Problem: Health Behavior/Discharge Planning: Goal: Identification of resources available to assist in meeting health care needs will improve Outcome: Progressing   Problem: Bladder/Genitourinary: Goal: Urinary functional status for postoperative course will improve Outcome: Progressing   Problem: Education: Goal: Ability to verbalize activity precautions or restrictions will improve Outcome: Progressing Goal: Knowledge of the prescribed therapeutic regimen will improve Outcome: Progressing Goal: Understanding of discharge needs will improve Outcome: Progressing   Problem: Activity: Goal: Ability to avoid complications of mobility impairment will improve Outcome: Progressing Goal: Ability to tolerate increased activity will improve Outcome: Progressing Goal: Will remain free from falls Outcome: Progressing   Problem: Bowel/Gastric: Goal: Gastrointestinal status for postoperative course will improve Outcome: Progressing   Problem: Clinical Measurements: Goal: Ability to maintain clinical measurements within normal limits will improve Outcome: Progressing Goal: Postoperative complications will be avoided or minimized Outcome:  Progressing Goal: Diagnostic test results will improve Outcome: Progressing   Problem: Pain Management: Goal: Pain level will decrease Outcome: Progressing   Problem: Skin Integrity: Goal: Will show signs of wound healing Outcome: Progressing   Problem: Health Behavior/Discharge Planning: Goal: Identification of resources available to assist in meeting health care needs will improve Outcome: Progressing   Problem: Bladder/Genitourinary: Goal: Urinary functional status for postoperative course will improve Outcome: Progressing   Problem: Education: Goal: Knowledge of General Education information will improve Description: Including pain rating scale, medication(s)/side effects and non-pharmacologic comfort measures Outcome: Progressing   Problem: Health Behavior/Discharge Planning: Goal: Ability to  manage health-related needs will improve Outcome: Progressing   Problem: Clinical Measurements: Goal: Ability to maintain clinical measurements within normal limits will improve Outcome: Progressing Goal: Will remain free from infection Outcome: Progressing Goal: Diagnostic test results will improve Outcome: Progressing Goal: Respiratory complications will improve Outcome: Progressing Goal: Cardiovascular complication will be avoided Outcome: Progressing   Problem: Activity: Goal: Risk for activity intolerance will decrease Outcome: Progressing   Problem: Nutrition: Goal: Adequate nutrition will be maintained Outcome: Progressing   Problem: Coping: Goal: Level of anxiety will decrease Outcome: Progressing   Problem: Elimination: Goal: Will not experience complications related to bowel motility Outcome: Progressing Goal: Will not experience complications related to urinary retention Outcome: Progressing   Problem: Pain Managment: Goal: General experience of comfort will improve and/or be controlled Outcome: Progressing   Problem: Safety: Goal: Ability to remain  free from injury will improve Outcome: Progressing   Problem: Skin Integrity: Goal: Risk for impaired skin integrity will decrease Outcome: Progressing

## 2023-12-03 NOTE — Progress Notes (Signed)
 Patient ID: Robin Chaney, female   DOB: 1950-12-03, 73 y.o.   MRN: [id number] Vital signs are stable Dressing changed today Minimal bleedthrough through posterior incisions Proximal weakness in the right lower extremity noted Continues to work with PT and OT Mobilize today as best tolerated Increased gabapentin to 300 mg 3 times daily

## 2023-12-03 NOTE — Progress Notes (Signed)
 Physical Therapy Treatment Patient Details Name: Robin Chaney MRN: [id number] DOB: June 29, 1951 Today's Date: 12/03/2023   History of Present Illness Pt is a 73 y/o female presenting on 2/24 for same day anterolateral decompression L 2-3, L3-4. L4-5 and posterior stabilization with pedicle screw fixation L2-5. PMH includes: anxiety, arthritis, chronic back pain, depression, insomnia.    PT Comments  Pt reports fatigue and back/RLE pain, but agreeable to PT session. Pt ambulatory for good hallway distance with use of RW and close guard for safety, requires cues for form/safety. Pt with min difficulty with RLE foot clearance, does well with cues for heel-toe gait. Pt progressing well, wants to d/c home at the end of the week with support of family as needed at d/c.      If plan is discharge home, recommend the following: A little help with walking and/or transfers;A little help with bathing/dressing/bathroom;Assistance with cooking/housework;Help with stairs or ramp for entrance;Assist for transportation   Can travel by private vehicle        Equipment Recommendations  None recommended by PT    Recommendations for Other Services       Precautions / Restrictions Precautions Precautions: Fall Recall of Precautions/Restrictions: Intact Precaution/Restrictions Comments: reviewed precautions in context of functional mobility Required Braces or Orthoses: Spinal Brace Spinal Brace: Applied in sitting position Restrictions Weight Bearing Restrictions Per Provider Order: No     Mobility  Bed Mobility Overal bed mobility: Needs Assistance Bed Mobility: Rolling, Sidelying to Sit, Sit to Sidelying Rolling: Min assist Sidelying to sit: Min assist     Sit to sidelying: Min assist General bed mobility comments: assist for LEs to/from EOB, completion of trunk elevation.    Transfers Overall transfer level: Needs assistance Equipment used: Rolling walker (2 wheels) Transfers: Sit  to/from Stand Sit to Stand: Contact guard assist           General transfer comment: for safety, slow to rise and steady.    Ambulation/Gait Ambulation/Gait assistance: Contact guard assist Gait Distance (Feet): 125 Feet Assistive device: Rolling walker (2 wheels) Gait Pattern/deviations: Step-through pattern, Decreased stride length, Trunk flexed Gait velocity: decr     General Gait Details: for safety, cues for placement in RW, upright posture, and increasing RLE foot clearance during swing phase of gait   Stairs             Wheelchair Mobility     Tilt Bed    Modified Rankin (Stroke Patients Only)       Balance Overall balance assessment: Needs assistance Sitting-balance support: No upper extremity supported, Feet supported Sitting balance-Leahy Scale: Fair     Standing balance support: Bilateral upper extremity supported, During functional activity, Single extremity supported Standing balance-Leahy Scale: Poor Standing balance comment: relies on BUE and external support                            Communication Communication Communication: No apparent difficulties  Cognition Arousal: Alert Behavior During Therapy: WFL for tasks assessed/performed   PT - Cognitive impairments: No apparent impairments                         Following commands: Intact      Cueing Cueing Techniques: Verbal cues  Exercises General Exercises - Lower Extremity Ankle Circles/Pumps: AROM, Both, 10 reps, Supine Quad Sets: AROM, Both, 5 reps, Supine    General Comments  Pertinent Vitals/Pain Pain Assessment Pain Assessment: Faces Faces Pain Scale: Hurts even more Pain Location: back, R LE Pain Descriptors / Indicators: Discomfort, Grimacing, Guarding Pain Intervention(s): Limited activity within patient's tolerance, Monitored during session, Repositioned    Home Living                          Prior Function             PT Goals (current goals can now be found in the care plan section) Acute Rehab PT Goals Patient Stated Goal: Feel better, go home PT Goal Formulation: With patient Time For Goal Achievement: 12/16/23 Potential to Achieve Goals: Good Progress towards PT goals: Progressing toward goals    Frequency    Min 1X/week      PT Plan      Co-evaluation              AM-PAC PT "6 Clicks" Mobility   Outcome Measure  Help needed turning from your back to your side while in a flat bed without using bedrails?: A Little Help needed moving from lying on your back to sitting on the side of a flat bed without using bedrails?: A Little Help needed moving to and from a bed to a chair (including a wheelchair)?: A Little Help needed standing up from a chair using your arms (e.g., wheelchair or bedside chair)?: A Little Help needed to walk in hospital room?: A Little Help needed climbing 3-5 steps with a railing? : A Lot 6 Click Score: 17    End of Session Equipment Utilized During Treatment: Back brace Activity Tolerance: Patient tolerated treatment well Patient left: in bed;with call bell/phone within reach Nurse Communication: Mobility status PT Visit Diagnosis: Unsteadiness on feet (R26.81);Pain     Time: 1201-1222 PT Time Calculation (min) (ACUTE ONLY): 21 min  Charges:    $Gait Training: 8-22 mins PT General Charges $$ ACUTE PT VISIT: 1 Visit                     Robin Chaney, PT DPT Acute Rehabilitation Services Secure Chat Preferred  Office [phone]    Robin Chaney 12/03/2023, 3:12 PM

## 2023-12-03 NOTE — Progress Notes (Signed)
 Patient refused Breo Ellipta, Incruse Ellipta, Claritin and Senokot.Patient requested medications be sent back to pharmacy to avoid charges.  Patient also refused morning doses of Lexapro and Crestor. Patient stated she takes those medications at before bed and did not want to get off her normal schedule.

## 2023-12-04 MED ORDER — METHOCARBAMOL 500 MG PO TABS
500.0000 mg | ORAL_TABLET | Freq: Four times a day (QID) | ORAL | 3 refills | Status: DC | PRN
  Filled 2023-12-04: qty 30, 8d supply, fill #0

## 2023-12-04 MED ORDER — DEXAMETHASONE 2 MG PO TABS
ORAL_TABLET | ORAL | 0 refills | Status: DC
  Filled 2023-12-04: qty 7, 6d supply, fill #0

## 2023-12-04 MED ORDER — GABAPENTIN 300 MG PO CAPS
ORAL_CAPSULE | ORAL | 3 refills | Status: DC
  Filled 2023-12-04: qty 100, 16d supply, fill #0
  Filled 2023-12-16: qty 100, 16d supply, fill #1

## 2023-12-04 MED ORDER — MAGNESIUM HYDROXIDE 400 MG/5ML PO SUSP
30.0000 mL | Freq: Every day | ORAL | Status: DC | PRN
  Administered 2023-12-04 – 2023-12-05 (×2): 30 mL via ORAL
  Filled 2023-12-04 (×2): qty 30

## 2023-12-04 MED ORDER — HYDROMORPHONE HCL 2 MG PO TABS
2.0000 mg | ORAL_TABLET | ORAL | 0 refills | Status: AC | PRN
  Filled 2023-12-04: qty 40, 7d supply, fill #0

## 2023-12-04 NOTE — Progress Notes (Signed)
 Physical Therapy Treatment Patient Details Name: Robin Chaney MRN: [id number] DOB: 1951-10-01 Today's Date: 12/04/2023   History of Present Illness Pt is a 73 y/o female presenting on 2/24 for same day anterolateral decompression L 2-3, L3-4. L4-5 and posterior stabilization with pedicle screw fixation L2-5. PMH includes: anxiety, arthritis, chronic back pain, depression, insomnia.    PT Comments  Pt progressing well with mobility though still continued to have weakness and numbness RLE. Pt unable to lift RLE against gravity so needs increased assist returning to bed compared to coming to EOB. Pt ambulated 300' with RW and CGA, gait pattern improved with distance. Discussed proper posture and activity modifications as well as consistent use of RW while RLE is weak. Recommend HHPT. PT will continue to follow.     If plan is discharge home, recommend the following: A little help with walking and/or transfers;A little help with bathing/dressing/bathroom;Assistance with cooking/housework;Help with stairs or ramp for entrance;Assist for transportation   Can travel by private vehicle        Equipment Recommendations  None recommended by PT    Recommendations for Other Services       Precautions / Restrictions Precautions Precautions: Fall Recall of Precautions/Restrictions: Intact Precaution/Restrictions Comments: reviewed back precautions Required Braces or Orthoses: Spinal Brace Spinal Brace: Applied in sitting position Restrictions Weight Bearing Restrictions Per Provider Order: No     Mobility  Bed Mobility Overal bed mobility: Needs Assistance Bed Mobility: Rolling, Sidelying to Sit Rolling: Contact guard assist Sidelying to sit: Contact guard assist     Sit to sidelying: Min assist General bed mobility comments: vc's for coming to EOB. Pt able to complete with use of rail but no physical assist, needed increased time. Pt needs min A to return to supine due to  inability to lift RLE into bed    Transfers Overall transfer level: Needs assistance Equipment used: Rolling walker (2 wheels) Transfers: Sit to/from Stand Sit to Stand: Contact guard assist           General transfer comment: CGA for safety. vc's for hand placement    Ambulation/Gait Ambulation/Gait assistance: Contact guard assist Gait Distance (Feet): 300 Feet Assistive device: Rolling walker (2 wheels) Gait Pattern/deviations: Step-through pattern, Decreased stride length, Trunk flexed Gait velocity: decr Gait velocity interpretation: <1.8 ft/sec, indicate of risk for recurrent falls Pre-gait activities: standing wt shifting General Gait Details: pt tolerated increased distance today. Began with step to pattern but progressed to step through. Discussed heel toe gait but pt step length so small at ths is point that she can't really achieve this. Limited on R side with decreased wt shift due to instability on that side.   Stairs Stairs:  (discussed sequencing. Only has to do if goes on deck)           Wheelchair Mobility     Tilt Bed    Modified Rankin (Stroke Patients Only)       Balance Overall balance assessment: Needs assistance Sitting-balance support: No upper extremity supported, Feet supported Sitting balance-Leahy Scale: Fair     Standing balance support: No upper extremity supported, Bilateral upper extremity supported, During functional activity Standing balance-Leahy Scale: Poor                              Communication Communication Communication: No apparent difficulties  Cognition Arousal: Alert Behavior During Therapy: WFL for tasks assessed/performed   PT - Cognitive impairments: No apparent impairments  Following commands: Intact      Cueing Cueing Techniques: Verbal cues  Exercises General Exercises - Lower Extremity Quad Sets: AROM, Both, Supine, 10 reps Other Exercises Other  Exercises: pelvic tilt x5    General Comments General comments (skin integrity, edema, etc.): VSS. Assisted pt with donning of brace. Discussed posture      Pertinent Vitals/Pain Pain Assessment Pain Assessment: Faces Faces Pain Scale: Hurts little more Pain Location: back, R LE Pain Descriptors / Indicators: Discomfort, Grimacing, Guarding Pain Intervention(s): Limited activity within patient's tolerance, Monitored during session    Home Living                          Prior Function            PT Goals (current goals can now be found in the care plan section) Acute Rehab PT Goals Patient Stated Goal: Feel better, go home PT Goal Formulation: With patient Time For Goal Achievement: 12/16/23 Potential to Achieve Goals: Good Progress towards PT goals: Progressing toward goals    Frequency    Min 1X/week      PT Plan      Co-evaluation              AM-PAC PT "6 Clicks" Mobility   Outcome Measure  Help needed turning from your back to your side while in a flat bed without using bedrails?: A Little Help needed moving from lying on your back to sitting on the side of a flat bed without using bedrails?: A Little Help needed moving to and from a bed to a chair (including a wheelchair)?: A Little Help needed standing up from a chair using your arms (e.g., wheelchair or bedside chair)?: A Little Help needed to walk in hospital room?: A Little Help needed climbing 3-5 steps with a railing? : A Lot 6 Click Score: 17    End of Session Equipment Utilized During Treatment: Gait belt;Back brace Activity Tolerance: Patient tolerated treatment well Patient left: in bed;with call bell/phone within reach Nurse Communication: Mobility status PT Visit Diagnosis: Unsteadiness on feet (R26.81);Pain Pain - Right/Left: Right Pain - part of body: Leg (back)     Time: 1610-9604 PT Time Calculation (min) (ACUTE ONLY): 31 min  Charges:    $Gait Training: 23-37  mins PT General Charges $$ ACUTE PT VISIT: 1 Visit                     Lyanne Co, PT  Acute Rehab Services Secure chat preferred Office [phone]    Lawana Chambers Brixton Schnapp 12/04/2023, 12:16 PM

## 2023-12-04 NOTE — TOC Initial Note (Addendum)
 Transition of Care (TOC) - Initial/Assessment Note  Donn Pierini RN,BSN Transitions of Care Unit 4NP (Non Trauma)- RN Case Manager See Treatment Team for direct Phone #   Patient Details  Name: Robin Chaney MRN: [id number] Date of Birth: [birth year]  Transition of Care [facility]) CM/SW Contact:    Darrold Span, RN Phone Number: 12/04/2023, 12:01 PM  Clinical Narrative:                 Cm in to speak with pt regarding HH and DME needs.   Discussed recommendations for HHPT/OT- pt agreeable- list provided for [facility] choice- Per CMS guidelines from [email or web address] website with star ratings (copy placed in shadow chart)- pt would like to review with a friend that is coming- this Clinical research associate provided contact info for pt to call when she has decided on choice.   1250- received return call from pt regarding HH choice- pt voiced she would like to see if [facility], [facility] or [facility] can accept- CM made call to [facility] - referral has been accepted- pending MD orders  Per pt she has needed DME at home including RW, elevated toilet and grab bars, she will purchase a shower/tub chair if needed.   Pt voiced her sister will be coming next week to stay with her, brother will provide transportation home.   Address, phone # and PCP all confirmed in epic.   Pt will need orders placed for HHPT/OT needs    Expected Discharge Plan: Home w Home Health Services Barriers to Discharge: Continued Medical Work up   Patient Goals and CMS Choice Patient states their goals for this hospitalization and ongoing recovery are:: return home CMS Medicare.gov Compare Post Acute Care list provided to:: Patient Choice offered to / list presented to : Patient      Expected Discharge Plan and Services   Discharge Planning Services: CM Consult Post Acute Care Choice: Home Health Living arrangements for the past 2 months: Single Family Home                 DME Arranged: N/A DME Agency: NA        HH Arranged: PT, OT          Prior Living Arrangements/Services Living arrangements for the past 2 months: Single Family Home Lives with:: Self Patient language and need for interpreter reviewed:: Yes Do you feel safe going back to the place where you live?: Yes      Need for Family Participation in Patient Care: Yes (Comment) Care giver support system in place?: Yes (comment) Current home services: DME (RW, elevated toilet seat, grab bars) Criminal Activity/Legal Involvement Pertinent to Current Situation/Hospitalization: No - Comment as needed  Activities of Daily Living      Permission Sought/Granted Permission sought to share information with : Facility Industrial/product designer granted to share information with : Yes, Verbal Permission Granted     Permission granted to share info w AGENCY: HH        Emotional Assessment Appearance:: Appears stated age Attitude/Demeanor/Rapport: Engaged Affect (typically observed): Accepting Orientation: : Oriented to Self, Oriented to Place, Oriented to  Time, Oriented to Situation Alcohol / Substance Use: Not Applicable Psych Involvement: No (comment)  Admission diagnosis:  Lumbar stenosis with neurogenic claudication [M48.062] Patient Active Problem List   Diagnosis Date Noted   Lumbar stenosis with neurogenic claudication 12/01/2023   Elevated blood pressure reading 10/11/2022   Dizziness 04/10/2022   Not well controlled moderate persistent asthma 01/29/2022  Recurrent infections 01/29/2022   Focal atelectasis R base p apparent aspiration of FB (rice)  04/04/2018   History of migraine headaches 12/05/2012   INSOMNIA 08/10/2010   DYSPNEA 08/10/2010   Seasonal and perennial allergic rhinoconjunctivitis 04/14/2010   PCP:  Margaree Mackintosh, MD Pharmacy:   [facility] [address] [place] [address] Phone: [phone] Fax: [phone]     Social Drivers of Health  (SDOH) Social History: SDOH Screenings   Food Insecurity: Patient Unable To Answer (12/02/2023)  Housing: Unknown (12/02/2023)  Transportation Needs: Patient Unable To Answer (12/02/2023)  Utilities: Patient Unable To Answer (12/02/2023)  Alcohol Screen: Low Risk  (09/22/2023)  Depression (PHQ2-9): Low Risk  (09/22/2023)  Financial Resource Strain: Low Risk  (09/22/2023)  Physical Activity: Sufficiently Active (09/22/2023)  Social Connections: Patient Unable To Answer (12/02/2023)  Recent Concern: Social Connections - Socially Isolated (09/22/2023)  Stress: No Stress Concern Present (09/22/2023)  Tobacco Use: Low Risk  (12/01/2023)  Health Literacy: Adequate Health Literacy (09/22/2023)   SDOH Interventions:     Readmission Risk Interventions     No data to display

## 2023-12-04 NOTE — Plan of Care (Signed)
 Problem: Education: Goal: Knowledge of General Education information will improve Description: Including pain rating scale, medication(s)/side effects and non-pharmacologic comfort measures Outcome: Progressing   Problem: Health Behavior/Discharge Planning: Goal: Ability to manage health-related needs will improve Outcome: Progressing   Problem: Clinical Measurements: Goal: Ability to maintain clinical measurements within normal limits will improve Outcome: Progressing Goal: Will remain free from infection Outcome: Progressing Goal: Diagnostic test results will improve Outcome: Progressing Goal: Respiratory complications will improve Outcome: Progressing Goal: Cardiovascular complication will be avoided Outcome: Progressing   Problem: Activity: Goal: Risk for activity intolerance will decrease Outcome: Progressing   Problem: Nutrition: Goal: Adequate nutrition will be maintained Outcome: Progressing   Problem: Coping: Goal: Level of anxiety will decrease Outcome: Progressing   Problem: Elimination: Goal: Will not experience complications related to bowel motility Outcome: Progressing Goal: Will not experience complications related to urinary retention Outcome: Progressing   Problem: Pain Managment: Goal: General experience of comfort will improve and/or be controlled Outcome: Progressing   Problem: Safety: Goal: Ability to remain free from injury will improve Outcome: Progressing   Problem: Skin Integrity: Goal: Risk for impaired skin integrity will decrease Outcome: Progressing   Problem: Education: Goal: Ability to verbalize activity precautions or restrictions will improve Outcome: Progressing Goal: Knowledge of the prescribed therapeutic regimen will improve Outcome: Progressing Goal: Understanding of discharge needs will improve Outcome: Progressing   Problem: Activity: Goal: Ability to avoid complications of mobility impairment will  improve Outcome: Progressing Goal: Ability to tolerate increased activity will improve Outcome: Progressing Goal: Will remain free from falls Outcome: Progressing   Problem: Bowel/Gastric: Goal: Gastrointestinal status for postoperative course will improve Outcome: Progressing   Problem: Clinical Measurements: Goal: Ability to maintain clinical measurements within normal limits will improve Outcome: Progressing Goal: Postoperative complications will be avoided or minimized Outcome: Progressing Goal: Diagnostic test results will improve Outcome: Progressing   Problem: Pain Management: Goal: Pain level will decrease Outcome: Progressing   Problem: Skin Integrity: Goal: Will show signs of wound healing Outcome: Progressing   Problem: Health Behavior/Discharge Planning: Goal: Identification of resources available to assist in meeting health care needs will improve Outcome: Progressing   Problem: Bladder/Genitourinary: Goal: Urinary functional status for postoperative course will improve Outcome: Progressing   Problem: Education: Goal: Ability to verbalize activity precautions or restrictions will improve Outcome: Progressing Goal: Knowledge of the prescribed therapeutic regimen will improve Outcome: Progressing Goal: Understanding of discharge needs will improve Outcome: Progressing   Problem: Activity: Goal: Ability to avoid complications of mobility impairment will improve Outcome: Progressing Goal: Ability to tolerate increased activity will improve Outcome: Progressing Goal: Will remain free from falls Outcome: Progressing   Problem: Bowel/Gastric: Goal: Gastrointestinal status for postoperative course will improve Outcome: Progressing   Problem: Clinical Measurements: Goal: Ability to maintain clinical measurements within normal limits will improve Outcome: Progressing Goal: Postoperative complications will be avoided or minimized Outcome:  Progressing Goal: Diagnostic test results will improve Outcome: Progressing   Problem: Pain Management: Goal: Pain level will decrease Outcome: Progressing   Problem: Skin Integrity: Goal: Will show signs of wound healing Outcome: Progressing   Problem: Health Behavior/Discharge Planning: Goal: Identification of resources available to assist in meeting health care needs will improve Outcome: Progressing   Problem: Bladder/Genitourinary: Goal: Urinary functional status for postoperative course will improve Outcome: Progressing   Problem: Education: Goal: Knowledge of General Education information will improve Description: Including pain rating scale, medication(s)/side effects and non-pharmacologic comfort measures Outcome: Progressing   Problem: Health Behavior/Discharge Planning: Goal: Ability to  manage health-related needs will improve Outcome: Progressing   Problem: Clinical Measurements: Goal: Ability to maintain clinical measurements within normal limits will improve Outcome: Progressing Goal: Will remain free from infection Outcome: Progressing Goal: Diagnostic test results will improve Outcome: Progressing Goal: Respiratory complications will improve Outcome: Progressing Goal: Cardiovascular complication will be avoided Outcome: Progressing   Problem: Activity: Goal: Risk for activity intolerance will decrease Outcome: Progressing   Problem: Nutrition: Goal: Adequate nutrition will be maintained Outcome: Progressing   Problem: Coping: Goal: Level of anxiety will decrease Outcome: Progressing   Problem: Elimination: Goal: Will not experience complications related to bowel motility Outcome: Progressing Goal: Will not experience complications related to urinary retention Outcome: Progressing   Problem: Pain Managment: Goal: General experience of comfort will improve and/or be controlled Outcome: Progressing   Problem: Safety: Goal: Ability to remain  free from injury will improve Outcome: Progressing   Problem: Skin Integrity: Goal: Risk for impaired skin integrity will decrease Outcome: Progressing

## 2023-12-04 NOTE — Progress Notes (Signed)
 Occupational Therapy Treatment Patient Details Name: Robin Chaney MRN: [id number] DOB: 10-Feb-1951 Today's Date: 12/04/2023   History of present illness Pt is a 73 y/o female presenting on 2/24 for same day anterolateral decompression L 2-3, L3-4. L4-5 and posterior stabilization with pedicle screw fixation L2-5. PMH includes: anxiety, arthritis, chronic back pain, depression, insomnia.   OT comments  Patient up in recliner and agreeable to OT session. Patient required min assist to donn brace and CGA to ambulate to bathroom. Patient able to perform grooming tasks standing at sink with CGA to supervision. Patient ambulated to EOB to address LB dressing with patient able to doff/donn LLE sock with figure 4 but unable to with RLE. Patient was provided education on AE use for LB dressing. Patient states she will get a reacher but will wait on other AE. Patient was min assist to get to sidelying due to assistance needed with RLE. Acute OT to continue to follow to address established goals to facilitate safe DC.       If plan is discharge home, recommend the following:  A lot of help with bathing/dressing/bathroom;A little help with walking and/or transfers;Assistance with cooking/housework;Assist for transportation;Help with stairs or ramp for entrance   Equipment Recommendations  Tub/shower seat    Recommendations for Other Services Rehab consult    Precautions / Restrictions Precautions Precautions: Fall Recall of Precautions/Restrictions: Intact Precaution/Restrictions Comments: reviewed back precautions Required Braces or Orthoses: Spinal Brace Spinal Brace: Applied in sitting position Restrictions Weight Bearing Restrictions Per Provider Order: No       Mobility Bed Mobility Overal bed mobility: Needs Assistance Bed Mobility: Rolling, Sit to Sidelying Rolling: Min assist       Sit to sidelying: Min assist General bed mobility comments: requires assistance with RLE     Transfers Overall transfer level: Needs assistance Equipment used: Rolling walker (2 wheels) Transfers: Sit to/from Stand Sit to Stand: Contact guard assist           General transfer comment: increased time with cues for hand placement and CGA     Balance Overall balance assessment: Needs assistance Sitting-balance support: No upper extremity supported, Feet supported Sitting balance-Leahy Scale: Fair Sitting balance - Comments: EOB   Standing balance support: Single extremity supported, Bilateral upper extremity supported, During functional activity Standing balance-Leahy Scale: Poor Standing balance comment: requires UE support when standing                           ADL either performed or assessed with clinical judgement   ADL Overall ADL's : Needs assistance/impaired     Grooming: Wash/dry hands;Wash/dry face;Oral care;Supervision/safety;Cueing for compensatory techniques;Standing Grooming Details (indicate cue type and reason): at sink         Upper Body Dressing : Minimal assistance;Sitting Upper Body Dressing Details (indicate cue type and reason): donning gown for back and back brace Lower Body Dressing: Moderate assistance;Sitting/lateral leans Lower Body Dressing Details (indicate cue type and reason): able to doff and donn left sock with figure 4 but unable to with RLE. Education on Biochemist, clinical use for LB dressing Toilet Transfer: Ambulation;Rolling walker (2 wheels);Contact guard assist             General ADL Comments: education on AE with patient stating she will get reacher but not sure if she needs other AE.    Extremity/Trunk Assessment              Vision  Perception     Praxis     Communication Communication Communication: No apparent difficulties   Cognition Arousal: Alert Behavior During Therapy: WFL for tasks assessed/performed Cognition: No apparent impairments                                Following commands: Intact        Cueing   Cueing Techniques: Verbal cues  Exercises      Shoulder Instructions       General Comments      Pertinent Vitals/ Pain       Pain Assessment Pain Assessment: Faces Faces Pain Scale: Hurts even more Pain Location: back, R LE Pain Descriptors / Indicators: Discomfort, Grimacing, Guarding Pain Intervention(s): Limited activity within patient's tolerance, Monitored during session, Repositioned  Home Living                                          Prior Functioning/Environment              Frequency  Min 1X/week        Progress Toward Goals  OT Goals(current goals can now be found in the care plan section)  Progress towards OT goals: Progressing toward goals  Acute Rehab OT Goals Patient Stated Goal: decreased pain OT Goal Formulation: With patient Time For Goal Achievement: 12/16/23 Potential to Achieve Goals: Good ADL Goals Pt Will Perform Grooming: with modified independence;standing Pt Will Perform Lower Body Dressing: with modified independence;sit to/from stand Pt Will Transfer to Toilet: with modified independence;ambulating;bedside commode Pt Will Perform Toileting - Clothing Manipulation and hygiene: with modified independence;sit to/from stand Pt Will Perform Tub/Shower Transfer: Shower transfer;with modified independence;shower seat;ambulating;rolling walker  Plan      Co-evaluation                 AM-PAC OT "6 Clicks" Daily Activity     Outcome Measure   Help from another person eating meals?: None Help from another person taking care of personal grooming?: A Little Help from another person toileting, which includes using toliet, bedpan, or urinal?: A Lot Help from another person bathing (including washing, rinsing, drying)?: A Lot Help from another person to put on and taking off regular upper body clothing?: A Little Help from another person to put on and taking  off regular lower body clothing?: A Lot 6 Click Score: 16    End of Session Equipment Utilized During Treatment: Gait belt;Rolling walker (2 wheels);Back brace  OT Visit Diagnosis: Other abnormalities of gait and mobility (R26.89);Pain Pain - Right/Left: Right Pain - part of body: Leg (and back)   Activity Tolerance Patient tolerated treatment well   Patient Left in bed;with call bell/phone within reach   Nurse Communication Mobility status        Time: 1610-9604 OT Time Calculation (min): 25 min  Charges: OT General Charges $OT Visit: 1 Visit OT Treatments $Self Care/Home Management : 23-37 mins  Alfonse Flavors, OTA Acute Rehabilitation Services  Office [phone]   Dewain Penning 12/04/2023, 10:56 AM

## 2023-12-04 NOTE — Progress Notes (Signed)
 Occupational Therapy Treatment Patient Details Name: Robin Chaney MRN: [id number] DOB: [birth year] Today's Date: 12/04/2023   History of present illness Pt is a 73 y/o female presenting on 2/24 for same day anterolateral decompression L 2-3, L3-4. L4-5 and posterior stabilization with pedicle screw fixation L2-5. PMH includes: anxiety, arthritis, chronic back pain, depression, insomnia.   OT comments  Patient seen for second visit due to asking to go to bathroom. Patient was min assist to get to EOB with good demonstration of log rolling but required assistance with RLE. Patient was min assist with verbal cues to donn back brace and CGA to ambulate to bathroom for toilet transfer with cues for hand placement and safety. Patient able to stand at sink for hand hygiene before returning to EOB. Patient asked to remain on EOB while nursing was in room. Discharge recommendations changed from [facility] to [facility] due to patient states she will be returning home with family to provide assistance. Acute OT to continue to follow to address established goals to facilitate DC to next venue of care.       If plan is discharge home, recommend the following:  A lot of help with bathing/dressing/bathroom;A little help with walking and/or transfers;Assistance with cooking/housework;Assist for transportation;Help with stairs or ramp for entrance   Equipment Recommendations  Tub/shower seat    Recommendations for Other Services Rehab consult    Precautions / Restrictions Precautions Precautions: Fall Recall of Precautions/Restrictions: Intact Precaution/Restrictions Comments: reviewed back precautions Required Braces or Orthoses: Spinal Brace Spinal Brace: Applied in sitting position Restrictions Weight Bearing Restrictions Per Provider Order: No       Mobility Bed Mobility Overal bed mobility: Needs Assistance Bed Mobility: Rolling, Sidelying to Sit Rolling: Min assist Sidelying to sit: Min assist      Sit to sidelying: Min assist General bed mobility comments: requires assistance with RLE    Transfers Overall transfer level: Needs assistance Equipment used: Rolling walker (2 wheels) Transfers: Sit to/from Stand Sit to Stand: Contact guard assist           General transfer comment: ambulated to bathroom for toilet transfer and returned to EOB with CGA     Balance Overall balance assessment: Needs assistance Sitting-balance support: No upper extremity supported, Feet supported Sitting balance-Leahy Scale: Fair Sitting balance - Comments: remained sitting on EOB at end of session   Standing balance support: No upper extremity supported, Bilateral upper extremity supported, During functional activity Standing balance-Leahy Scale: Poor Standing balance comment: able to stand at sink for BUE hand hygiene with no UE support, reliant on UE support for dynamic balance                           ADL either performed or assessed with clinical judgement   ADL Overall ADL's : Needs assistance/impaired     Grooming: Wash/dry hands;Contact guard assist;Standing Grooming Details (indicate cue type and reason): at sink         Upper Body Dressing : Minimal assistance;Sitting Upper Body Dressing Details (indicate cue type and reason): to donn back brace Lower Body Dressing: Moderate assistance;Sitting/lateral leans Lower Body Dressing Details (indicate cue type and reason): able to doff and donn left sock with figure 4 but unable to with RLE. Education on Biochemist, clinical use for LB dressing Toilet Transfer: Contact guard assist;Ambulation;BSC/3in1;Regular Toilet;Grab bars;Rolling walker (2 wheels) Toilet Transfer Details (indicate cue type and reason): ambulated to bathroom to 3n1 over commode with  cues for hand placement Toileting- Clothing Manipulation and Hygiene: Modified independent;Sitting/lateral lean Toileting - Clothing Manipulation Details (indicate cue  type and reason): able to perform toilet hygiene after voiding       General ADL Comments: education on AE with patient stating she will get reacher but not sure if she needs other AE.    Extremity/Trunk Assessment              Vision       Restaurant manager, fast food Communication: No apparent difficulties   Cognition Arousal: Alert Behavior During Therapy: WFL for tasks assessed/performed Cognition: No apparent impairments                               Following commands: Intact        Cueing   Cueing Techniques: Verbal cues  Exercises      Shoulder Instructions       General Comments Patient states she has ordered a reacher following our am session    Pertinent Vitals/ Pain       Pain Assessment Pain Assessment: Faces Faces Pain Scale: Hurts little more Pain Location: back, R LE Pain Descriptors / Indicators: Discomfort, Grimacing, Guarding Pain Intervention(s): Limited activity within patient's tolerance, Monitored during session, Premedicated before session, Repositioned  Home Living                                          Prior Functioning/Environment              Frequency  Min 1X/week        Progress Toward Goals  OT Goals(current goals can now be found in the care plan section)  Progress towards OT goals: Progressing toward goals  Acute Rehab OT Goals Patient Stated Goal: to go home OT Goal Formulation: With patient Time For Goal Achievement: 12/16/23 Potential to Achieve Goals: Good ADL Goals Pt Will Perform Grooming: with modified independence;standing Pt Will Perform Lower Body Dressing: with modified independence;sit to/from stand Pt Will Transfer to Toilet: with modified independence;ambulating;bedside commode Pt Will Perform Toileting - Clothing Manipulation and hygiene: with modified independence;sit to/from stand Pt Will Perform Tub/Shower Transfer: Shower  transfer;with modified independence;shower seat;ambulating;rolling walker  Plan      Co-evaluation                 AM-PAC OT "6 Clicks" Daily Activity     Outcome Measure   Help from another person eating meals?: None Help from another person taking care of personal grooming?: A Little Help from another person toileting, which includes using toliet, bedpan, or urinal?: A Little Help from another person bathing (including washing, rinsing, drying)?: A Lot Help from another person to put on and taking off regular upper body clothing?: A Little Help from another person to put on and taking off regular lower body clothing?: A Lot 6 Click Score: 17    End of Session Equipment Utilized During Treatment: Gait belt;Rolling walker (2 wheels);Back brace  OT Visit Diagnosis: Other abnormalities of gait and mobility (R26.89);Pain Pain - Right/Left: Right Pain - part of body: Leg (and back)   Activity Tolerance Patient tolerated treatment well   Patient Left in bed;with call bell/phone within reach;with nursing/sitter in room;Other (comment) (left seated on EOB)   Nurse Communication Mobility  status        Time: 1017-1030 OT Time Calculation (min): 13 min  Charges: OT General Charges $OT Visit: 1 Visit OT Treatments $Self Care/Home Management : 8-22 mins  Alfonse Flavors, OTA Acute Rehabilitation Services  Office [phone]   Dewain Penning 12/04/2023, 11:06 AM

## 2023-12-04 NOTE — Discharge Summary (Signed)
 Physician Discharge Summary  Patient ID: Robin Chaney MRN: [id number] DOB/AGE: March 09, 1951 73 y.o.  Admit date: 12/01/2023 Discharge date: 12/05/2023  Admission Diagnoses: Lumbar stenosis with radiculopathy L2-3 L3-4 L4-5.  Neurogenic claudication.  Discharge Diagnoses: Lumbar stenosis with radiculopathy L2-3 L3-4 L4-5.  Neurogenic claudication. Principal Problem:   Lumbar stenosis with neurogenic claudication   Discharged Condition: fair  Hospital Course: Patient was admitted to undergo surgery which she tolerated well.  Postoperatively she has had some right lower extremity pain and weakness.  This is gradually improving.  Consults: rehabilitation medicine  Significant Diagnostic Studies: None  Treatments: surgery: See op note  Discharge Exam: Blood pressure (!) 136/59, pulse 85, temperature 98.5 F (36.9 C), temperature source Oral, resp. rate 18, height 5\' 4"  (1.626 m), weight 71.3 kg, SpO2 96%. Incision is clean and dry Station and gait are intact albeit there is proximal leg weakness in the right lower extremity in the iliopsoas at 2 out of 5 quadriceps is 3+ out of 5.  He also anterior and gastroc strength is normal.  Disposition: Discharge disposition: 01-Home or Self Care       Discharge Instructions     Call MD for:  redness, tenderness, or signs of infection (pain, swelling, redness, odor or green/yellow discharge around incision site)   Complete by: As directed    Call MD for:  severe uncontrolled pain   Complete by: As directed    Call MD for:  temperature >100.4   Complete by: As directed    Diet - low sodium heart healthy   Complete by: As directed    Discharge instructions   Complete by: As directed    .hj   Incentive spirometry RT   Complete by: As directed    Increase activity slowly   Complete by: As directed    No dressing needed   Complete by: As directed       Allergies as of 12/04/2023       Reactions   Codeine Nausea Only         Medication List     TAKE these medications    albuterol 108 (90 Base) MCG/ACT inhaler Commonly known as: VENTOLIN HFA Inhale 2 puffs into the lungs every 6 (six) hours as needed for wheezing or shortness of breath.   ALPRAZolam 0.5 MG tablet Commonly known as: XANAX TAKE 1 TABLET(0.5 MG) BY MOUTH TWICE DAILY AS NEEDED FOR ANXIETY   Breztri Aerosphere 160-9-4.8 MCG/ACT Aero Generic drug: Budeson-Glycopyrrol-Formoterol Inhale 2 puffs into the lungs in the morning and at bedtime. with spacer and rinse mouth afterwards.   dexamethasone 1 MG tablet Commonly known as: DECADRON 2 tablets twice daily for 2 days, one tablet twice daily for 2 days, one tablet daily for 2 days.   escitalopram 20 MG tablet Commonly known as: LEXAPRO TAKE 1 TABLET DAILY What changed: when to take this   fexofenadine 180 MG tablet Commonly known as: ALLEGRA Take 180 mg by mouth daily as needed for allergies.   gabapentin 300 MG capsule Commonly known as: NEURONTIN Take 2 at bedtime or up to 3 times daily as needed for pain   HYDROmorphone 2 MG tablet Commonly known as: Dilaudid Take 1 tablet (2 mg total) by mouth every 4 (four) hours as needed for up to 7 days for severe pain (pain score 7-10).   methocarbamol 500 MG tablet Commonly known as: ROBAXIN Take 1 tablet (500 mg total) by mouth every 6 (six) hours as needed for muscle spasms.  montelukast 10 MG tablet Commonly known as: SINGULAIR TAKE 1 TABLET AT BEDTIME   phentermine 15 MG capsule Take 1 capsule (15 mg total) by mouth every morning.   rosuvastatin 5 MG tablet Commonly known as: CRESTOR TAKE 1 TABLET DAILY What changed: when to take this   tretinoin 0.1 % cream Commonly known as: RETIN-A Apply to affected areas at bedtime as directed.   VITAMIN D3 PO Take 1,000 Units by mouth at bedtime.   zolpidem 10 MG tablet Commonly known as: AMBIEN TAKE 1 TABLET AT BEDTIME               Discharge Care Instructions   (From admission, onward)           Start     Ordered   12/04/23 0000  No dressing needed        12/04/23 1854            Follow-up Information     Care, [facility] Follow up.   Specialty: Home Health Services Why: HHPT/OT arranged- they will contact you to schedule Contact information: [address] [place] [address] [phone]                 Signed: Stefani Dama 12/04/2023, 6:54 PM

## 2023-12-04 NOTE — Progress Notes (Signed)
 Patient ID: Robin Chaney, female   DOB: 1951/01/02, 73 y.o.   MRN: [id number] Vital signs are stable.  Patient still having numbness in right anterior thigh and weakness in the leg with difficulty flexing hip Motor function is otherwise intact distally.  She is ambulatory with the use of a walker.  Plan is for discharge tomorrow.

## 2023-12-05 ENCOUNTER — Other Ambulatory Visit (HOSPITAL_COMMUNITY): Payer: Self-pay

## 2023-12-05 NOTE — TOC Transition Note (Signed)
 Transition of Care (TOC) - Discharge Note Donn Pierini RN,BSN Transitions of Care Unit 4NP (Non Trauma)- RN Case Manager See Treatment Team for direct Phone #   Patient Details  Name: Robin Chaney MRN: [id number] Date of Birth: 1951/01/11  Transition of Care [facility]) CM/SW Contact:  Darrold Span, RN Phone Number: 12/05/2023, 10:11 AM   Clinical Narrative:    Pt stable for transition home today, HHPT/OT orders placed. CM has notified [facility] and they will follow up for start of care.   Per pt brother to come transport home,  No DME needs noted.   No further TOC needs at this time.    Final next level of care: Home w Home Health Services Barriers to Discharge: Barriers Resolved   Patient Goals and CMS Choice Patient states their goals for this hospitalization and ongoing recovery are:: return home CMS Medicare.gov Compare Post Acute Care list provided to:: Patient Choice offered to / list presented to : Patient      Discharge Placement               Home w/ [facility]        Discharge Plan and Services Additional resources added to the After Visit Summary for     Discharge Planning Services: CM Consult Post Acute Care Choice: Home Health          DME Arranged: N/A DME Agency: NA       HH Arranged: PT, OT HH Agency: [facility] Date [facility] Agency Contacted: 11/27/23   Representative spoke with at [facility] Agency: Kandee Keen  Social Drivers of Health (SDOH) Interventions SDOH Screenings   Food Insecurity: Patient Unable To Answer (12/02/2023)  Housing: Unknown (12/02/2023)  Transportation Needs: Patient Unable To Answer (12/02/2023)  Utilities: Patient Unable To Answer (12/02/2023)  Alcohol Screen: Low Risk  (09/22/2023)  Depression (PHQ2-9): Low Risk  (09/22/2023)  Financial Resource Strain: Low Risk  (09/22/2023)  Physical Activity: Sufficiently Active (09/22/2023)  Social Connections: Patient Unable To Answer (12/02/2023)  Recent Concern:  Social Connections - Socially Isolated (09/22/2023)  Stress: No Stress Concern Present (09/22/2023)  Tobacco Use: Low Risk  (12/01/2023)  Health Literacy: Adequate Health Literacy (09/22/2023)     Readmission Risk Interventions    12/05/2023   10:11 AM  Readmission Risk Prevention Plan  Post Dischage Appt Complete  Medication Screening Complete  Transportation Screening Complete

## 2023-12-05 NOTE — Progress Notes (Addendum)
 Order to discharge patient home. Brother to come transport home. Patient going to discharge lounge. Discharge instructions/AVS given to and reviewed with patient. Education provided as needed. Patient verbalized understanding. Lower back and right flank incision dressings clean dry and intact. PIV removed by Renee NT3. TOC medications ready and will be picked up on the way out. Personal belongings sent home with patient. Home via private vehicle.

## 2023-12-05 NOTE — Progress Notes (Signed)
 Patient ID: Robin Chaney, female   DOB: Sep 19, 1951, 73 y.o.   MRN: [id number] Discharge orders have been written and signed patient is ready for discharge today.  Questions answered I advised that she can shower.

## 2023-12-11 DIAGNOSIS — Z7951 Long term (current) use of inhaled steroids: Secondary | ICD-10-CM | POA: Diagnosis not present

## 2023-12-11 DIAGNOSIS — Z4789 Encounter for other orthopedic aftercare: Secondary | ICD-10-CM | POA: Diagnosis not present

## 2023-12-11 DIAGNOSIS — G8929 Other chronic pain: Secondary | ICD-10-CM | POA: Diagnosis not present

## 2023-12-11 DIAGNOSIS — Z981 Arthrodesis status: Secondary | ICD-10-CM | POA: Diagnosis not present

## 2023-12-11 DIAGNOSIS — Z8701 Personal history of pneumonia (recurrent): Secondary | ICD-10-CM | POA: Diagnosis not present

## 2023-12-11 DIAGNOSIS — E785 Hyperlipidemia, unspecified: Secondary | ICD-10-CM | POA: Diagnosis not present

## 2023-12-11 DIAGNOSIS — F32A Depression, unspecified: Secondary | ICD-10-CM | POA: Diagnosis not present

## 2023-12-11 DIAGNOSIS — F419 Anxiety disorder, unspecified: Secondary | ICD-10-CM | POA: Diagnosis not present

## 2023-12-11 DIAGNOSIS — M1990 Unspecified osteoarthritis, unspecified site: Secondary | ICD-10-CM | POA: Diagnosis not present

## 2023-12-11 DIAGNOSIS — M4156 Other secondary scoliosis, lumbar region: Secondary | ICD-10-CM | POA: Diagnosis not present

## 2023-12-11 DIAGNOSIS — Z9181 History of falling: Secondary | ICD-10-CM | POA: Diagnosis not present

## 2023-12-11 DIAGNOSIS — K219 Gastro-esophageal reflux disease without esophagitis: Secondary | ICD-10-CM | POA: Diagnosis not present

## 2023-12-11 DIAGNOSIS — M4726 Other spondylosis with radiculopathy, lumbar region: Secondary | ICD-10-CM | POA: Diagnosis not present

## 2023-12-11 DIAGNOSIS — J45909 Unspecified asthma, uncomplicated: Secondary | ICD-10-CM | POA: Diagnosis not present

## 2023-12-11 DIAGNOSIS — M48062 Spinal stenosis, lumbar region with neurogenic claudication: Secondary | ICD-10-CM | POA: Diagnosis not present

## 2023-12-11 DIAGNOSIS — G4700 Insomnia, unspecified: Secondary | ICD-10-CM | POA: Diagnosis not present

## 2023-12-13 ENCOUNTER — Other Ambulatory Visit: Payer: Self-pay | Admitting: Internal Medicine

## 2023-12-15 ENCOUNTER — Other Ambulatory Visit (HOSPITAL_COMMUNITY): Payer: Self-pay

## 2023-12-16 ENCOUNTER — Other Ambulatory Visit: Payer: Self-pay

## 2023-12-16 DIAGNOSIS — M4156 Other secondary scoliosis, lumbar region: Secondary | ICD-10-CM | POA: Diagnosis not present

## 2023-12-16 DIAGNOSIS — F419 Anxiety disorder, unspecified: Secondary | ICD-10-CM | POA: Diagnosis not present

## 2023-12-16 DIAGNOSIS — Z8701 Personal history of pneumonia (recurrent): Secondary | ICD-10-CM | POA: Diagnosis not present

## 2023-12-16 DIAGNOSIS — Z9181 History of falling: Secondary | ICD-10-CM | POA: Diagnosis not present

## 2023-12-16 DIAGNOSIS — Z4789 Encounter for other orthopedic aftercare: Secondary | ICD-10-CM | POA: Diagnosis not present

## 2023-12-16 DIAGNOSIS — M4726 Other spondylosis with radiculopathy, lumbar region: Secondary | ICD-10-CM | POA: Diagnosis not present

## 2023-12-16 DIAGNOSIS — Z981 Arthrodesis status: Secondary | ICD-10-CM | POA: Diagnosis not present

## 2023-12-16 DIAGNOSIS — J45909 Unspecified asthma, uncomplicated: Secondary | ICD-10-CM | POA: Diagnosis not present

## 2023-12-16 DIAGNOSIS — K219 Gastro-esophageal reflux disease without esophagitis: Secondary | ICD-10-CM | POA: Diagnosis not present

## 2023-12-16 DIAGNOSIS — F32A Depression, unspecified: Secondary | ICD-10-CM | POA: Diagnosis not present

## 2023-12-16 DIAGNOSIS — M48062 Spinal stenosis, lumbar region with neurogenic claudication: Secondary | ICD-10-CM | POA: Diagnosis not present

## 2023-12-16 DIAGNOSIS — Z7951 Long term (current) use of inhaled steroids: Secondary | ICD-10-CM | POA: Diagnosis not present

## 2023-12-16 DIAGNOSIS — G8929 Other chronic pain: Secondary | ICD-10-CM | POA: Diagnosis not present

## 2023-12-16 DIAGNOSIS — M1990 Unspecified osteoarthritis, unspecified site: Secondary | ICD-10-CM | POA: Diagnosis not present

## 2023-12-16 DIAGNOSIS — G4700 Insomnia, unspecified: Secondary | ICD-10-CM | POA: Diagnosis not present

## 2023-12-16 DIAGNOSIS — E785 Hyperlipidemia, unspecified: Secondary | ICD-10-CM | POA: Diagnosis not present

## 2023-12-19 DIAGNOSIS — M48062 Spinal stenosis, lumbar region with neurogenic claudication: Secondary | ICD-10-CM | POA: Diagnosis not present

## 2023-12-19 DIAGNOSIS — F419 Anxiety disorder, unspecified: Secondary | ICD-10-CM | POA: Diagnosis not present

## 2023-12-19 DIAGNOSIS — Z9181 History of falling: Secondary | ICD-10-CM | POA: Diagnosis not present

## 2023-12-19 DIAGNOSIS — Z4789 Encounter for other orthopedic aftercare: Secondary | ICD-10-CM | POA: Diagnosis not present

## 2023-12-19 DIAGNOSIS — K219 Gastro-esophageal reflux disease without esophagitis: Secondary | ICD-10-CM | POA: Diagnosis not present

## 2023-12-19 DIAGNOSIS — G8929 Other chronic pain: Secondary | ICD-10-CM | POA: Diagnosis not present

## 2023-12-19 DIAGNOSIS — Z7951 Long term (current) use of inhaled steroids: Secondary | ICD-10-CM | POA: Diagnosis not present

## 2023-12-19 DIAGNOSIS — M4726 Other spondylosis with radiculopathy, lumbar region: Secondary | ICD-10-CM | POA: Diagnosis not present

## 2023-12-19 DIAGNOSIS — J45909 Unspecified asthma, uncomplicated: Secondary | ICD-10-CM | POA: Diagnosis not present

## 2023-12-19 DIAGNOSIS — G4700 Insomnia, unspecified: Secondary | ICD-10-CM | POA: Diagnosis not present

## 2023-12-19 DIAGNOSIS — M4156 Other secondary scoliosis, lumbar region: Secondary | ICD-10-CM | POA: Diagnosis not present

## 2023-12-19 DIAGNOSIS — Z8701 Personal history of pneumonia (recurrent): Secondary | ICD-10-CM | POA: Diagnosis not present

## 2023-12-19 DIAGNOSIS — F32A Depression, unspecified: Secondary | ICD-10-CM | POA: Diagnosis not present

## 2023-12-19 DIAGNOSIS — Z981 Arthrodesis status: Secondary | ICD-10-CM | POA: Diagnosis not present

## 2023-12-19 DIAGNOSIS — M1990 Unspecified osteoarthritis, unspecified site: Secondary | ICD-10-CM | POA: Diagnosis not present

## 2023-12-19 DIAGNOSIS — E785 Hyperlipidemia, unspecified: Secondary | ICD-10-CM | POA: Diagnosis not present

## 2023-12-22 ENCOUNTER — Other Ambulatory Visit: Payer: Self-pay

## 2023-12-22 ENCOUNTER — Other Ambulatory Visit (HOSPITAL_COMMUNITY): Payer: Self-pay

## 2023-12-22 DIAGNOSIS — M4156 Other secondary scoliosis, lumbar region: Secondary | ICD-10-CM | POA: Diagnosis not present

## 2023-12-22 DIAGNOSIS — F32A Depression, unspecified: Secondary | ICD-10-CM | POA: Diagnosis not present

## 2023-12-22 DIAGNOSIS — Z4789 Encounter for other orthopedic aftercare: Secondary | ICD-10-CM | POA: Diagnosis not present

## 2023-12-22 DIAGNOSIS — M4726 Other spondylosis with radiculopathy, lumbar region: Secondary | ICD-10-CM | POA: Diagnosis not present

## 2023-12-22 DIAGNOSIS — Z7951 Long term (current) use of inhaled steroids: Secondary | ICD-10-CM | POA: Diagnosis not present

## 2023-12-22 DIAGNOSIS — Z9181 History of falling: Secondary | ICD-10-CM | POA: Diagnosis not present

## 2023-12-22 DIAGNOSIS — Z8701 Personal history of pneumonia (recurrent): Secondary | ICD-10-CM | POA: Diagnosis not present

## 2023-12-22 DIAGNOSIS — J45909 Unspecified asthma, uncomplicated: Secondary | ICD-10-CM | POA: Diagnosis not present

## 2023-12-22 DIAGNOSIS — F419 Anxiety disorder, unspecified: Secondary | ICD-10-CM | POA: Diagnosis not present

## 2023-12-22 DIAGNOSIS — M1990 Unspecified osteoarthritis, unspecified site: Secondary | ICD-10-CM | POA: Diagnosis not present

## 2023-12-22 DIAGNOSIS — M48062 Spinal stenosis, lumbar region with neurogenic claudication: Secondary | ICD-10-CM | POA: Diagnosis not present

## 2023-12-22 DIAGNOSIS — K219 Gastro-esophageal reflux disease without esophagitis: Secondary | ICD-10-CM | POA: Diagnosis not present

## 2023-12-22 DIAGNOSIS — G8929 Other chronic pain: Secondary | ICD-10-CM | POA: Diagnosis not present

## 2023-12-22 DIAGNOSIS — Z981 Arthrodesis status: Secondary | ICD-10-CM | POA: Diagnosis not present

## 2023-12-22 DIAGNOSIS — G4700 Insomnia, unspecified: Secondary | ICD-10-CM | POA: Diagnosis not present

## 2023-12-22 DIAGNOSIS — E785 Hyperlipidemia, unspecified: Secondary | ICD-10-CM | POA: Diagnosis not present

## 2023-12-24 DIAGNOSIS — M5416 Radiculopathy, lumbar region: Secondary | ICD-10-CM | POA: Diagnosis not present

## 2023-12-26 DIAGNOSIS — Z981 Arthrodesis status: Secondary | ICD-10-CM | POA: Diagnosis not present

## 2023-12-26 DIAGNOSIS — M1990 Unspecified osteoarthritis, unspecified site: Secondary | ICD-10-CM | POA: Diagnosis not present

## 2023-12-26 DIAGNOSIS — Z9181 History of falling: Secondary | ICD-10-CM | POA: Diagnosis not present

## 2023-12-26 DIAGNOSIS — Z4789 Encounter for other orthopedic aftercare: Secondary | ICD-10-CM | POA: Diagnosis not present

## 2023-12-26 DIAGNOSIS — G8929 Other chronic pain: Secondary | ICD-10-CM | POA: Diagnosis not present

## 2023-12-26 DIAGNOSIS — K219 Gastro-esophageal reflux disease without esophagitis: Secondary | ICD-10-CM | POA: Diagnosis not present

## 2023-12-26 DIAGNOSIS — M4726 Other spondylosis with radiculopathy, lumbar region: Secondary | ICD-10-CM | POA: Diagnosis not present

## 2023-12-26 DIAGNOSIS — J45909 Unspecified asthma, uncomplicated: Secondary | ICD-10-CM | POA: Diagnosis not present

## 2023-12-26 DIAGNOSIS — G4700 Insomnia, unspecified: Secondary | ICD-10-CM | POA: Diagnosis not present

## 2023-12-26 DIAGNOSIS — E785 Hyperlipidemia, unspecified: Secondary | ICD-10-CM | POA: Diagnosis not present

## 2023-12-26 DIAGNOSIS — F32A Depression, unspecified: Secondary | ICD-10-CM | POA: Diagnosis not present

## 2023-12-26 DIAGNOSIS — Z8701 Personal history of pneumonia (recurrent): Secondary | ICD-10-CM | POA: Diagnosis not present

## 2023-12-26 DIAGNOSIS — Z7951 Long term (current) use of inhaled steroids: Secondary | ICD-10-CM | POA: Diagnosis not present

## 2023-12-26 DIAGNOSIS — F419 Anxiety disorder, unspecified: Secondary | ICD-10-CM | POA: Diagnosis not present

## 2023-12-26 DIAGNOSIS — M48062 Spinal stenosis, lumbar region with neurogenic claudication: Secondary | ICD-10-CM | POA: Diagnosis not present

## 2023-12-26 DIAGNOSIS — M4156 Other secondary scoliosis, lumbar region: Secondary | ICD-10-CM | POA: Diagnosis not present

## 2023-12-30 DIAGNOSIS — M48062 Spinal stenosis, lumbar region with neurogenic claudication: Secondary | ICD-10-CM | POA: Diagnosis not present

## 2023-12-30 DIAGNOSIS — Z8701 Personal history of pneumonia (recurrent): Secondary | ICD-10-CM | POA: Diagnosis not present

## 2023-12-30 DIAGNOSIS — Z981 Arthrodesis status: Secondary | ICD-10-CM | POA: Diagnosis not present

## 2023-12-30 DIAGNOSIS — Z4789 Encounter for other orthopedic aftercare: Secondary | ICD-10-CM | POA: Diagnosis not present

## 2023-12-30 DIAGNOSIS — K219 Gastro-esophageal reflux disease without esophagitis: Secondary | ICD-10-CM | POA: Diagnosis not present

## 2023-12-30 DIAGNOSIS — G4700 Insomnia, unspecified: Secondary | ICD-10-CM | POA: Diagnosis not present

## 2023-12-30 DIAGNOSIS — Z9181 History of falling: Secondary | ICD-10-CM | POA: Diagnosis not present

## 2023-12-30 DIAGNOSIS — M4726 Other spondylosis with radiculopathy, lumbar region: Secondary | ICD-10-CM | POA: Diagnosis not present

## 2023-12-30 DIAGNOSIS — J45909 Unspecified asthma, uncomplicated: Secondary | ICD-10-CM | POA: Diagnosis not present

## 2023-12-30 DIAGNOSIS — E785 Hyperlipidemia, unspecified: Secondary | ICD-10-CM | POA: Diagnosis not present

## 2023-12-30 DIAGNOSIS — M4156 Other secondary scoliosis, lumbar region: Secondary | ICD-10-CM | POA: Diagnosis not present

## 2023-12-30 DIAGNOSIS — Z7951 Long term (current) use of inhaled steroids: Secondary | ICD-10-CM | POA: Diagnosis not present

## 2023-12-30 DIAGNOSIS — F32A Depression, unspecified: Secondary | ICD-10-CM | POA: Diagnosis not present

## 2023-12-30 DIAGNOSIS — F419 Anxiety disorder, unspecified: Secondary | ICD-10-CM | POA: Diagnosis not present

## 2023-12-30 DIAGNOSIS — G8929 Other chronic pain: Secondary | ICD-10-CM | POA: Diagnosis not present

## 2023-12-30 DIAGNOSIS — M1990 Unspecified osteoarthritis, unspecified site: Secondary | ICD-10-CM | POA: Diagnosis not present

## 2024-01-01 ENCOUNTER — Telehealth: Payer: Self-pay | Admitting: Internal Medicine

## 2024-01-01 DIAGNOSIS — F419 Anxiety disorder, unspecified: Secondary | ICD-10-CM | POA: Diagnosis not present

## 2024-01-01 DIAGNOSIS — M4156 Other secondary scoliosis, lumbar region: Secondary | ICD-10-CM | POA: Diagnosis not present

## 2024-01-01 DIAGNOSIS — F32A Depression, unspecified: Secondary | ICD-10-CM | POA: Diagnosis not present

## 2024-01-01 DIAGNOSIS — G8929 Other chronic pain: Secondary | ICD-10-CM | POA: Diagnosis not present

## 2024-01-01 DIAGNOSIS — Z4789 Encounter for other orthopedic aftercare: Secondary | ICD-10-CM | POA: Diagnosis not present

## 2024-01-01 DIAGNOSIS — G4700 Insomnia, unspecified: Secondary | ICD-10-CM | POA: Diagnosis not present

## 2024-01-01 DIAGNOSIS — M48062 Spinal stenosis, lumbar region with neurogenic claudication: Secondary | ICD-10-CM | POA: Diagnosis not present

## 2024-01-01 DIAGNOSIS — M1990 Unspecified osteoarthritis, unspecified site: Secondary | ICD-10-CM | POA: Diagnosis not present

## 2024-01-01 DIAGNOSIS — E785 Hyperlipidemia, unspecified: Secondary | ICD-10-CM | POA: Diagnosis not present

## 2024-01-01 DIAGNOSIS — Z7951 Long term (current) use of inhaled steroids: Secondary | ICD-10-CM | POA: Diagnosis not present

## 2024-01-01 DIAGNOSIS — K219 Gastro-esophageal reflux disease without esophagitis: Secondary | ICD-10-CM | POA: Diagnosis not present

## 2024-01-01 DIAGNOSIS — Z981 Arthrodesis status: Secondary | ICD-10-CM | POA: Diagnosis not present

## 2024-01-01 DIAGNOSIS — J45909 Unspecified asthma, uncomplicated: Secondary | ICD-10-CM | POA: Diagnosis not present

## 2024-01-01 DIAGNOSIS — M4726 Other spondylosis with radiculopathy, lumbar region: Secondary | ICD-10-CM | POA: Diagnosis not present

## 2024-01-01 DIAGNOSIS — Z9181 History of falling: Secondary | ICD-10-CM | POA: Diagnosis not present

## 2024-01-01 DIAGNOSIS — Z8701 Personal history of pneumonia (recurrent): Secondary | ICD-10-CM | POA: Diagnosis not present

## 2024-01-01 NOTE — Telephone Encounter (Signed)
 Received faxed Home Health orders from [facility] today. I called them and let them know that the surgeon should be the one signing off on the orders, that we usually do not get involved when there has been a surgery. I also called the patient and she was under the understanding that Dr Danielle Dess was signing off on the Home Health orders because she has been having physical therapy ever since she came home from hospital.

## 2024-01-09 DIAGNOSIS — E785 Hyperlipidemia, unspecified: Secondary | ICD-10-CM | POA: Diagnosis not present

## 2024-01-09 DIAGNOSIS — G8929 Other chronic pain: Secondary | ICD-10-CM | POA: Diagnosis not present

## 2024-01-09 DIAGNOSIS — G4700 Insomnia, unspecified: Secondary | ICD-10-CM | POA: Diagnosis not present

## 2024-01-09 DIAGNOSIS — K219 Gastro-esophageal reflux disease without esophagitis: Secondary | ICD-10-CM | POA: Diagnosis not present

## 2024-01-09 DIAGNOSIS — F32A Depression, unspecified: Secondary | ICD-10-CM | POA: Diagnosis not present

## 2024-01-09 DIAGNOSIS — F419 Anxiety disorder, unspecified: Secondary | ICD-10-CM | POA: Diagnosis not present

## 2024-01-09 DIAGNOSIS — J45909 Unspecified asthma, uncomplicated: Secondary | ICD-10-CM | POA: Diagnosis not present

## 2024-01-09 DIAGNOSIS — Z981 Arthrodesis status: Secondary | ICD-10-CM | POA: Diagnosis not present

## 2024-01-09 DIAGNOSIS — M4156 Other secondary scoliosis, lumbar region: Secondary | ICD-10-CM | POA: Diagnosis not present

## 2024-01-09 DIAGNOSIS — M4726 Other spondylosis with radiculopathy, lumbar region: Secondary | ICD-10-CM | POA: Diagnosis not present

## 2024-01-09 DIAGNOSIS — M1990 Unspecified osteoarthritis, unspecified site: Secondary | ICD-10-CM | POA: Diagnosis not present

## 2024-01-09 DIAGNOSIS — Z9181 History of falling: Secondary | ICD-10-CM | POA: Diagnosis not present

## 2024-01-09 DIAGNOSIS — Z8701 Personal history of pneumonia (recurrent): Secondary | ICD-10-CM | POA: Diagnosis not present

## 2024-01-09 DIAGNOSIS — Z4789 Encounter for other orthopedic aftercare: Secondary | ICD-10-CM | POA: Diagnosis not present

## 2024-01-09 DIAGNOSIS — M48062 Spinal stenosis, lumbar region with neurogenic claudication: Secondary | ICD-10-CM | POA: Diagnosis not present

## 2024-01-09 DIAGNOSIS — Z7951 Long term (current) use of inhaled steroids: Secondary | ICD-10-CM | POA: Diagnosis not present

## 2024-01-13 DIAGNOSIS — Z981 Arthrodesis status: Secondary | ICD-10-CM | POA: Diagnosis not present

## 2024-01-13 DIAGNOSIS — M1990 Unspecified osteoarthritis, unspecified site: Secondary | ICD-10-CM | POA: Diagnosis not present

## 2024-01-13 DIAGNOSIS — E785 Hyperlipidemia, unspecified: Secondary | ICD-10-CM | POA: Diagnosis not present

## 2024-01-13 DIAGNOSIS — Z4789 Encounter for other orthopedic aftercare: Secondary | ICD-10-CM | POA: Diagnosis not present

## 2024-01-13 DIAGNOSIS — M48062 Spinal stenosis, lumbar region with neurogenic claudication: Secondary | ICD-10-CM | POA: Diagnosis not present

## 2024-01-13 DIAGNOSIS — Z7951 Long term (current) use of inhaled steroids: Secondary | ICD-10-CM | POA: Diagnosis not present

## 2024-01-13 DIAGNOSIS — F419 Anxiety disorder, unspecified: Secondary | ICD-10-CM | POA: Diagnosis not present

## 2024-01-13 DIAGNOSIS — M4156 Other secondary scoliosis, lumbar region: Secondary | ICD-10-CM | POA: Diagnosis not present

## 2024-01-13 DIAGNOSIS — F32A Depression, unspecified: Secondary | ICD-10-CM | POA: Diagnosis not present

## 2024-01-13 DIAGNOSIS — G8929 Other chronic pain: Secondary | ICD-10-CM | POA: Diagnosis not present

## 2024-01-13 DIAGNOSIS — G4700 Insomnia, unspecified: Secondary | ICD-10-CM | POA: Diagnosis not present

## 2024-01-13 DIAGNOSIS — M4726 Other spondylosis with radiculopathy, lumbar region: Secondary | ICD-10-CM | POA: Diagnosis not present

## 2024-01-13 DIAGNOSIS — J45909 Unspecified asthma, uncomplicated: Secondary | ICD-10-CM | POA: Diagnosis not present

## 2024-01-13 DIAGNOSIS — K219 Gastro-esophageal reflux disease without esophagitis: Secondary | ICD-10-CM | POA: Diagnosis not present

## 2024-01-13 DIAGNOSIS — Z9181 History of falling: Secondary | ICD-10-CM | POA: Diagnosis not present

## 2024-01-13 DIAGNOSIS — Z8701 Personal history of pneumonia (recurrent): Secondary | ICD-10-CM | POA: Diagnosis not present

## 2024-01-19 ENCOUNTER — Other Ambulatory Visit: Payer: Self-pay | Admitting: Internal Medicine

## 2024-01-19 NOTE — Telephone Encounter (Unsigned)
 Copied from CRM [id number]. Topic: Clinical - Medication Refill >> Jan 19, 2024 12:08 PM Star [place] wrote: Most Recent Primary Care Visit:  Provider: Sylvan Evener  Department: [facility]  Visit Type: ANNUAL WELL VISIT, SEQUENTIAL  Date: 09/22/2023  Medication: zolpidem (AMBIEN) 10 MG tablet  Has the patient contacted their pharmacy? Yes (Agent: If no, request that the patient contact the pharmacy for the refill. If patient does not wish to contact the pharmacy document the reason why and proceed with request.) (Agent: If yes, when and what did the pharmacy advise?)  Is this the correct pharmacy for this prescription? Yes If no, delete pharmacy and type the correct one.  This is the patient's preferred pharmacy:   [facility] - [place], [place] - [address], [place] [address] Phone: [phone]  Fax: [phone]     Has the prescription been filled recently? Yes  Is the patient out of the medication? No  Has the patient been seen for an appointment in the last year OR does the patient have an upcoming appointment? Yes  Can we respond through MyChart? Yes  Agent: Please be advised that Rx refills may take up to 3 business days. We ask that you follow-up with your pharmacy.

## 2024-01-20 MED ORDER — ZOLPIDEM TARTRATE 10 MG PO TABS: 10.0000 mg | ORAL_TABLET | Freq: Every day | ORAL | 1 refills | Status: DC

## 2024-01-22 DIAGNOSIS — E785 Hyperlipidemia, unspecified: Secondary | ICD-10-CM | POA: Diagnosis not present

## 2024-01-22 DIAGNOSIS — M48062 Spinal stenosis, lumbar region with neurogenic claudication: Secondary | ICD-10-CM | POA: Diagnosis not present

## 2024-01-22 DIAGNOSIS — Z9181 History of falling: Secondary | ICD-10-CM | POA: Diagnosis not present

## 2024-01-22 DIAGNOSIS — K219 Gastro-esophageal reflux disease without esophagitis: Secondary | ICD-10-CM | POA: Diagnosis not present

## 2024-01-22 DIAGNOSIS — G4700 Insomnia, unspecified: Secondary | ICD-10-CM | POA: Diagnosis not present

## 2024-01-22 DIAGNOSIS — G8929 Other chronic pain: Secondary | ICD-10-CM | POA: Diagnosis not present

## 2024-01-22 DIAGNOSIS — Z4789 Encounter for other orthopedic aftercare: Secondary | ICD-10-CM | POA: Diagnosis not present

## 2024-01-22 DIAGNOSIS — F32A Depression, unspecified: Secondary | ICD-10-CM | POA: Diagnosis not present

## 2024-01-22 DIAGNOSIS — M4156 Other secondary scoliosis, lumbar region: Secondary | ICD-10-CM | POA: Diagnosis not present

## 2024-01-22 DIAGNOSIS — M4726 Other spondylosis with radiculopathy, lumbar region: Secondary | ICD-10-CM | POA: Diagnosis not present

## 2024-01-22 DIAGNOSIS — Z981 Arthrodesis status: Secondary | ICD-10-CM | POA: Diagnosis not present

## 2024-01-22 DIAGNOSIS — M1990 Unspecified osteoarthritis, unspecified site: Secondary | ICD-10-CM | POA: Diagnosis not present

## 2024-01-22 DIAGNOSIS — J45909 Unspecified asthma, uncomplicated: Secondary | ICD-10-CM | POA: Diagnosis not present

## 2024-01-22 DIAGNOSIS — F419 Anxiety disorder, unspecified: Secondary | ICD-10-CM | POA: Diagnosis not present

## 2024-01-22 DIAGNOSIS — Z8701 Personal history of pneumonia (recurrent): Secondary | ICD-10-CM | POA: Diagnosis not present

## 2024-01-22 DIAGNOSIS — Z7951 Long term (current) use of inhaled steroids: Secondary | ICD-10-CM | POA: Diagnosis not present

## 2024-01-26 ENCOUNTER — Other Ambulatory Visit: Payer: Self-pay

## 2024-01-26 ENCOUNTER — Ambulatory Visit (INDEPENDENT_AMBULATORY_CARE_PROVIDER_SITE_OTHER): Admitting: Internal Medicine

## 2024-01-26 ENCOUNTER — Encounter: Payer: Self-pay | Admitting: Internal Medicine

## 2024-01-26 VITALS — BP 120/76 | HR 82 | Temp 98.5°F | Resp 18 | Ht 64.0 in | Wt 155.7 lb

## 2024-01-26 DIAGNOSIS — J0110 Acute frontal sinusitis, unspecified: Secondary | ICD-10-CM | POA: Diagnosis not present

## 2024-01-26 DIAGNOSIS — H1013 Acute atopic conjunctivitis, bilateral: Secondary | ICD-10-CM

## 2024-01-26 DIAGNOSIS — J4540 Moderate persistent asthma, uncomplicated: Secondary | ICD-10-CM | POA: Diagnosis not present

## 2024-01-26 DIAGNOSIS — J302 Other seasonal allergic rhinitis: Secondary | ICD-10-CM

## 2024-01-26 DIAGNOSIS — B999 Unspecified infectious disease: Secondary | ICD-10-CM

## 2024-01-26 DIAGNOSIS — J3089 Other allergic rhinitis: Secondary | ICD-10-CM

## 2024-01-26 MED ORDER — METHYLPREDNISOLONE ACETATE 40 MG/ML IJ SUSP
40.0000 mg | Freq: Once | INTRAMUSCULAR | Status: AC
  Administered 2024-01-26: 40 mg via INTRAMUSCULAR

## 2024-01-26 MED ORDER — AMOXICILLIN-POT CLAVULANATE 875-125 MG PO TABS
1.0000 | ORAL_TABLET | Freq: Two times a day (BID) | ORAL | 0 refills | Status: AC
Start: 2024-01-26 — End: 2024-02-05

## 2024-01-26 MED ORDER — PREDNISONE 20 MG PO TABS: 20.0000 mg | ORAL_TABLET | Freq: Every day | ORAL | 0 refills | Status: DC

## 2024-01-26 NOTE — Progress Notes (Signed)
 FOLLOW UP Date of Service/Encounter:  01/26/24  Subjective:  Robin Chaney (DOB: Dec 24, 1950) is a 73 y.o. female who [facility] in re-evaluation of the following: acute visit for cough and congestion. History obtained from: chart review and patient.  For Review, LV was on 11/10/23  with Dr. Burdette Carolin seen for routine follow-up-followed for asthma, allergic rhinoconjunctivitis, recurrent infections . See below for summary of history and diagnostics.   Therapeutic plans/changes recommended: At that visit she reported a persistent cough since September following a trip to [place] where she had a bronchitis flare.  She was prescribed a Z-Pak and given a steroid injection.  Additionally she was started on Breztri  which replaced her Symbicort . ----------------------------------------------------- Pertinent History/Diagnostics:  Not well controlled moderate persistent asthma Past history - 2023 CXR showed peribronchial thickening. Using albuterol  once a day at least. Used Symbicort  for 1 week then stopped as she was concerned if vaginal bleeding was caused by this inhaler. Covid-19 infection 1 year ago. 2023 spirometry showed: possible restrictive disease with 6% improvement in FEV1 post bronchodilator treatment. Clinically feeling improved. No coughing post treatment.  -current plan: Breztri , singulair  Seasonal allergic rhinitis due to pollen Allergic rhinitis due to animal dander Allergic rhinitis due to mold Allergic conjunctivitis of both eyes Past history - Used to be on AIT for 1 year with good benefit. 1 dog at home. 2023 bloodwork positive to dog, grass pollen and mold.  Current plan: OTC AH, Ryaltris  nasal spray Recurrent infections Past history - Frequent bronchitis episodes. Up to date with flu, pneumonia and Covid-19 vaccines. 2023 bloodwork showed normal immunoglobulin levels, good protection against diptheria, tetanus and pneumoniae.  -2024  WBC 3.1 (apparently this is her normal).   Lactose intolerance --------------------------------------------------- Today presents for follow-up.  History of Present Illness   Robin Chaney is a 73 year old female with asthma who presents with a green productive cough and wheezing.  She has been experiencing a green productive cough for over a week, accompanied by rib pain due to excessive coughing and a general feeling of malaise. No fever is present. Despite using her inhalers, including Breztri , she finds them ineffective at this time. Her breathing test today is worse than in February.  She has a history of asthma and typically requires stronger medication than a Z-Pak, as it does not work well for her. She has been on Singulair  but still experiences exacerbations during pollen season. She has a history of requiring steroids during pollen season.  She underwent a three-level lumbar fusion approximately eight weeks ago and continues to experience back and knee problems post-surgery. She reports numbness in her leg and nerve damage down her spine, which she attributes to the surgery. She is undergoing physical therapy but feels weak and is struggling with the exercises in part due to her breathing..  Her past medical history includes a consistently low white blood cell count over the past two years. She recalls blood work done in the hospital showing a slightly low white count, with no clear explanation provided.       All medications reviewed by clinical staff and updated in chart. No new pertinent medical or surgical history except as noted in HPI.  ROS: All others negative except as noted per HPI.   Objective:  BP 120/76 (BP Location: Right Arm, Patient Position: Sitting, Cuff Size: Normal)   Pulse 82   Temp 98.5 F (36.9 C) (Temporal)   Resp 18  Ht 5\' 4"  (1.626 m)   Wt 155 lb 11.2 oz (70.6 kg)   SpO2 95%   BMI 26.73 kg/m  Body mass index is 26.73 kg/m. Physical  Exam: General Appearance:  Alert, cooperative, no distress, appears stated age  Head:  Normocephalic, without obvious abnormality, atraumatic  Eyes:  Conjunctiva clear, EOM's intact  Nose: Nares normal, hypertrophic turbinates, normal mucosa, and no visible anterior polyps  Throat: Lips, tongue normal; teeth and gums normal, normal posterior oropharynx  Neck: Supple, symmetrical  Lungs:   wheezing throughout, Respirations unlabored, no coughing  Heart:  regular rate and rhythm and no murmur, Appears well perfused  Extremities: No edema  Skin: Skin color, texture, turgor normal and no rashes or lesions on visualized portions of skin  Neurologic: No gross deficits   Labs:  Lab Orders  No laboratory test(s) ordered today    Spirometry:  Tracings reviewed. Her effort: Good reproducible efforts. FVC: 1.78L FEV1: 1.36L, 64% predicted FEV1/FVC ratio: 0.76 Interpretation: Nonobstructive ratio, low FEV1, possible restriction.  Please see scanned spirometry results for details.   Assessment/Plan   Asthma flare/acute sinusitis Start Augmentin  875 mg twice daily for 10 days - take with probiotics or live cultured yogurt. 40 mg IM depo given in clinic Tomorrow start prednisone  40 mg daily for 4 days. Take with meal in morning. Use albuterol  4 puffs every 4 hours while awake for the next 2-3 days Wait 4 weeks after prednisone  to get labwork to see if qualifies for injectable asthma medication  Asthma-not controlled, 2 rounds OCS in past year due to asthma flare. Daily controller medication(s):continue Breztri  160 mcg 2 puffs twice a day with spacer and rinse mouth afterwards  Continue Singulair  (montelukast ) 10mg  daily at night. May use albuterol  rescue inhaler 2 puffs every 4 to 6 hours as needed for shortness of breath, chest tightness, coughing, and wheezing. May use albuterol  rescue inhaler 2 puffs 5 to 15 minutes prior to strenuous physical activities. Monitor frequency of use - if you  need to use it more than twice per week on a consistent basis let us  know.  Breathing control goals:  Full participation in all desired activities (may need albuterol  before activity) Albuterol  use two times or less a week on average (not counting use with activity) Cough interfering with sleep two times or less a month Oral steroids no more than once a year No hospitalizations   Environmental allergies-at goal 2023 bloodwork positive to dog, grass pollen and mold.  Use over the counter antihistamines such as Zyrtec (cetirizine), Claritin  (loratadine ), Allegra (fexofenadine), or Xyzal (levocetirizine) daily as needed. May take twice a day during allergy  flares. May switch antihistamines every few months. May use Ryaltris  (olopatadine  + mometasone nasal spray combination) 1-2 sprays per nostril twice a day.  Nasal saline spray (i.e., Simply Saline) or nasal saline lavage (i.e., NeilMed) is recommended as needed and prior to medicated nasal sprays. May use Refresh re-wetting eye drops.   Recurrent infections-stable Keep track of infections and antibiotics use.  Lactose intolerance-stable May use lactose free milk or take a lactaid pill right before consuming anything with dairy.  Follow up in 3 months or sooner if needed.  It was a pleasure meeting you in clinic today.  Other: none  Jonathon Neighbors, MD  [facility] 

## 2024-01-26 NOTE — Patient Instructions (Addendum)
 Asthma flare/acute sinusitis Start Augmentin  875 mg twice daily for 10 days - take with probiotics or live cultured yogurt. 40 mg IM depo given in clinic Tomorrow start prednisone  40 mg daily for 4 days. Take with meal in morning. Use albuterol  4 puffs every 4 hours while awake for the next 2-3 days Wait 4 weeks after prednisone  to get labwork to see if qualifies for injectable asthma medication  Breathing/coughing Daily controller medication(s):continue Breztri  160 mcg 2 puffs twice a day with spacer and rinse mouth afterwards  Continue Singulair  (montelukast ) 10mg  daily at night. May use albuterol  rescue inhaler 2 puffs every 4 to 6 hours as needed for shortness of breath, chest tightness, coughing, and wheezing. May use albuterol  rescue inhaler 2 puffs 5 to 15 minutes prior to strenuous physical activities. Monitor frequency of use - if you need to use it more than twice per week on a consistent basis let us  know.  Breathing control goals:  Full participation in all desired activities (may need albuterol  before activity) Albuterol  use two times or less a week on average (not counting use with activity) Cough interfering with sleep two times or less a month Oral steroids no more than once a year No hospitalizations   Environmental allergies 2023 bloodwork positive to dog, grass pollen and mold.  Use over the counter antihistamines such as Zyrtec (cetirizine), Claritin  (loratadine ), Allegra (fexofenadine), or Xyzal (levocetirizine) daily as needed. May take twice a day during allergy  flares. May switch antihistamines every few months. May use Ryaltris  (olopatadine  + mometasone nasal spray combination) 1-2 sprays per nostril twice a day.  Nasal saline spray (i.e., Simply Saline) or nasal saline lavage (i.e., NeilMed) is recommended as needed and prior to medicated nasal sprays. May use Refresh re-wetting eye drops.   Recurrent infections Keep track of infections and antibiotics  use.  Lactose intolerance May use lactose free milk or take a lactaid pill right before consuming anything with dairy.  Follow up in 3 months or sooner if needed.  It was a pleasure meeting you in clinic today. Jonathon Neighbors, MD [facility]

## 2024-01-27 ENCOUNTER — Telehealth: Payer: Self-pay | Admitting: Internal Medicine

## 2024-01-27 MED ORDER — PREDNISONE 20 MG PO TABS: 40.0000 mg | ORAL_TABLET | Freq: Every day | ORAL | 0 refills | Status: AC

## 2024-01-27 NOTE — Telephone Encounter (Signed)
 Rx updated and sent. Verified per last OV.

## 2024-01-27 NOTE — Telephone Encounter (Signed)
 Patient called stating she is needing the dosage for her prednisone  increased to 40 mg instead of 20mg . Patient states that she and Dr. Cornel Diesel spoke about her taking 40 mg of prednisone  for 4 days.

## 2024-02-02 ENCOUNTER — Other Ambulatory Visit: Payer: Self-pay | Admitting: Allergy

## 2024-02-02 DIAGNOSIS — M4726 Other spondylosis with radiculopathy, lumbar region: Secondary | ICD-10-CM | POA: Diagnosis not present

## 2024-02-02 DIAGNOSIS — E785 Hyperlipidemia, unspecified: Secondary | ICD-10-CM | POA: Diagnosis not present

## 2024-02-02 DIAGNOSIS — M48062 Spinal stenosis, lumbar region with neurogenic claudication: Secondary | ICD-10-CM | POA: Diagnosis not present

## 2024-02-02 DIAGNOSIS — F32A Depression, unspecified: Secondary | ICD-10-CM | POA: Diagnosis not present

## 2024-02-02 DIAGNOSIS — M1990 Unspecified osteoarthritis, unspecified site: Secondary | ICD-10-CM | POA: Diagnosis not present

## 2024-02-02 DIAGNOSIS — F419 Anxiety disorder, unspecified: Secondary | ICD-10-CM | POA: Diagnosis not present

## 2024-02-02 DIAGNOSIS — Z9181 History of falling: Secondary | ICD-10-CM | POA: Diagnosis not present

## 2024-02-02 DIAGNOSIS — M4156 Other secondary scoliosis, lumbar region: Secondary | ICD-10-CM | POA: Diagnosis not present

## 2024-02-02 DIAGNOSIS — G4700 Insomnia, unspecified: Secondary | ICD-10-CM | POA: Diagnosis not present

## 2024-02-02 DIAGNOSIS — Z7951 Long term (current) use of inhaled steroids: Secondary | ICD-10-CM | POA: Diagnosis not present

## 2024-02-02 DIAGNOSIS — J45909 Unspecified asthma, uncomplicated: Secondary | ICD-10-CM | POA: Diagnosis not present

## 2024-02-02 DIAGNOSIS — Z8701 Personal history of pneumonia (recurrent): Secondary | ICD-10-CM | POA: Diagnosis not present

## 2024-02-02 DIAGNOSIS — Z4789 Encounter for other orthopedic aftercare: Secondary | ICD-10-CM | POA: Diagnosis not present

## 2024-02-02 DIAGNOSIS — Z981 Arthrodesis status: Secondary | ICD-10-CM | POA: Diagnosis not present

## 2024-02-02 DIAGNOSIS — K219 Gastro-esophageal reflux disease without esophagitis: Secondary | ICD-10-CM | POA: Diagnosis not present

## 2024-02-02 DIAGNOSIS — G8929 Other chronic pain: Secondary | ICD-10-CM | POA: Diagnosis not present

## 2024-02-04 ENCOUNTER — Telehealth: Payer: Self-pay | Admitting: Internal Medicine

## 2024-02-04 DIAGNOSIS — M5416 Radiculopathy, lumbar region: Secondary | ICD-10-CM | POA: Diagnosis not present

## 2024-02-04 NOTE — Telephone Encounter (Signed)
 Neftaly called and stated that she is still not feeling well after her antibiotics she was prescribed at her last visit. She wants to consult with Dr. Cornel Diesel and see if she thinks she needs to come in and be seen, or if she should just have more medication to be called in for her. She is requesting a call back as soon as possible.

## 2024-02-05 ENCOUNTER — Encounter: Payer: Self-pay | Admitting: Allergy

## 2024-02-05 ENCOUNTER — Ambulatory Visit: Admitting: Allergy

## 2024-02-05 VITALS — BP 124/68 | HR 74 | Temp 97.9°F

## 2024-02-05 DIAGNOSIS — J4540 Moderate persistent asthma, uncomplicated: Secondary | ICD-10-CM

## 2024-02-05 DIAGNOSIS — J0110 Acute frontal sinusitis, unspecified: Secondary | ICD-10-CM | POA: Diagnosis not present

## 2024-02-05 DIAGNOSIS — J302 Other seasonal allergic rhinitis: Secondary | ICD-10-CM

## 2024-02-05 DIAGNOSIS — H1013 Acute atopic conjunctivitis, bilateral: Secondary | ICD-10-CM | POA: Diagnosis not present

## 2024-02-05 DIAGNOSIS — H1010 Acute atopic conjunctivitis, unspecified eye: Secondary | ICD-10-CM

## 2024-02-05 DIAGNOSIS — J3089 Other allergic rhinitis: Secondary | ICD-10-CM

## 2024-02-05 MED ORDER — AZITHROMYCIN 250 MG PO TABS: ORAL_TABLET | ORAL | 0 refills | Status: DC

## 2024-02-05 NOTE — Telephone Encounter (Signed)
 Called patient and left vm requesting call back. Also sent a MyChart message. If patient calls back please schedule OV.

## 2024-02-05 NOTE — Telephone Encounter (Signed)
 Patient has OV with Dr. Tempie Fee today at 4:40. She is still having productive cough and SOB.

## 2024-02-05 NOTE — Progress Notes (Unsigned)
 Follow-up Note  RE: Zehra Jean Mceuen MRN: [id number] DOB: [birth year] Date of Office Visit: 02/05/2024   History of present illness: Debborah Jean Platt is a 73 y.o. female presenting today for acute visit for sinusitis.  She was last seen in the office by Dr. Cornel Diesel on 01/26/2024 for acute sinusitis with asthma flare.  She has history of asthma, allergic rhinitis, recurrent infections and lactose intolerance.  At her last visit she was treated with a Depo-Medrol  injection given in office as well as a 4-day prednisone  course with Augmentin  10-day course.  Discussed the use of AI scribe software for clinical note transcription with the patient, who gave verbal consent to proceed.  History of Present Illness   Ebunoluwa Jean Griess is a 73 year old female with asthma who presents with persistent respiratory symptoms following lumbar fusion surgery.  Approximately eight weeks ago, she underwent a ten-hour, three-level lumbar fusion surgery. Since then, she has experienced persistent respiratory symptoms, including a green productive cough and shortness of breath. These symptoms have not improved despite completing a course of antibiotics and prednisone . The cough is severe enough to wake her at night, and although the wheezing has improved, it remains present.  She has a history of asthma and uses several inhalers, including Breztri  and a rescue inhaler, albuterol , twice a day. She is confused about the use of Breztri , as it is marketed for COPD, but she has been told she has asthma. Her long-standing history of respiratory issues includes bronchitis, asthma, and recurrent pneumonia, which are particularly challenging during pollen season.  Her current medication regimen includes Breztri  and albuterol , and she has previously been on Augmentin , which did not alleviate her symptoms. Additionally, she takes Allegra during the day and Zyrtec at night for allergy  management.  She reports nerve damage  from the lumbar fusion surgery, resulting in bilateral leg pain, particularly affecting her ability to walk. She mentions that she was dropped in the hospital post-surgery, which exacerbated her knee pain.  Her social history includes living in [place], and she mentions that her house is filled with medications due to her recent surgery and ongoing health issues.      Review of systems: 10pt ROS negative unless noted above in HPI  Past medical/social/surgical/family history have been reviewed and are unchanged unless specifically indicated below.  No changes  Medication List: Current Outpatient Medications  Medication Sig Dispense Refill   albuterol  (VENTOLIN  HFA) 108 (90 Base) MCG/ACT inhaler Inhale 2 puffs into the lungs every 6 (six) hours as needed for wheezing or shortness of breath. 54 g 3   ALPRAZolam  (XANAX ) 0.5 MG tablet TAKE 1 TABLET(0.5 MG) BY MOUTH TWICE DAILY AS NEEDED FOR ANXIETY 60 tablet 0   azithromycin  (ZITHROMAX ) 250 MG tablet Take 2 tabs day 1, then 1 tab day 2-5 6 each 0   Budeson-Glycopyrrol-Formoterol  (BREZTRI  AEROSPHERE) 160-9-4.8 MCG/ACT AERO Inhale 2 puffs into the lungs in the morning and at bedtime. with spacer and rinse mouth afterwards. 3 each 2   Cholecalciferol (VITAMIN D3 PO) Take 1,000 Units by mouth at bedtime.     escitalopram  (LEXAPRO ) 20 MG tablet TAKE 1 TABLET DAILY (Patient taking differently: Take 20 mg by mouth at bedtime.) 90 tablet 3   fexofenadine (ALLEGRA) 180 MG tablet Take 180 mg by mouth daily as needed for allergies.     gabapentin  (NEURONTIN ) 300 MG capsule Take 2 capsules by mouth at bedtime or up to 3 times daily as needed for pain 100 capsule  3   methocarbamol  (ROBAXIN ) 500 MG tablet Take 1 tablet (500 mg total) by mouth every 6 (six) hours as needed for muscle spasms. 30 tablet 3   montelukast  (SINGULAIR ) 10 MG tablet TAKE 1 TABLET AT BEDTIME 90 tablet 3   rosuvastatin  (CRESTOR ) 5 MG tablet TAKE 1 TABLET DAILY (Patient taking  differently: Take 5 mg by mouth at bedtime.) 90 tablet 3   tretinoin  (RETIN-A ) 0.1 % cream Apply to affected areas at bedtime as directed. (Patient taking differently: Apply 1 Application topically once a week.) 45 g 1   zolpidem  (AMBIEN ) 10 MG tablet Take 1 tablet (10 mg total) by mouth at bedtime. 90 tablet 1   No current facility-administered medications for this visit.     Known medication allergies: Allergies  Allergen Reactions   Codeine Nausea Only     Physical examination: Blood pressure 124/68, pulse 74, temperature 97.9 F (36.6 C), temperature source Temporal, SpO2 98%.  General: Alert, interactive, in no acute distress. HEENT: PERRLA, TMs pearly gray, turbinates mildly edematous without discharge, post-pharynx non erythematous. Neck: Supple without lymphadenopathy. Lungs: Clear to auscultation without wheezing, rhonchi or rales. {no increased work of breathing. CV: Normal S1, S2 without murmurs. Abdomen: Nondistended, nontender. Skin: Warm and dry, without lesions or rashes. Extremities:  No clubbing, cyanosis or edema. Neuro:   Grossly intact.  Diagnositics/Labs: None today  Assessment and plan: Asthma flare/acute sinusitis Incomplete resolution of symptoms.  No significant improvement with Augmentin  course.  Thus will use Azithromycin  2 tabs day 1, then 1 tab day 2-5. This will cover different respiratory bacteria than Augmentin  You have received steroid injection and oral course.  Do not feel you need any further steroids.  Once you have been off for at least 4 weeks then have your labs drawn.  Use Airsupra 2 puffs OR albuterol  2-4 puffs every 4 hours as needed for cough, shortness of breath, wheeze or chest tightness.   Airsupra is a combination rescue inhaler with albuterol  + budesonide  for more longer lasting results.  Samples provided.   Breathing/coughing Daily controller medication(s):continue Breztri  160 mcg 2 puffs twice a day with spacer and rinse mouth  afterwards  Continue Singulair  (montelukast ) 10mg  daily at night. May use Airsupra 2 puffs OR albuterol  rescue inhaler 2 puffs every 4 to 6 hours as needed for shortness of breath, chest tightness, coughing, and wheezing. May use albuterol  rescue inhaler 2 puffs 5 to 15 minutes prior to strenuous physical activities. Monitor frequency of use - if you need to use it more than twice per week on a consistent basis let us  know.  Breathing control goals:  Full participation in all desired activities (may need albuterol  before activity) Albuterol  use two times or less a week on average (not counting use with activity) Cough interfering with sleep two times or less a month Oral steroids no more than once a year No hospitalizations   Environmental allergies 2023 bloodwork positive to dog, grass pollen and mold.  Use over the counter antihistamines such as Zyrtec (cetirizine), Claritin  (loratadine ), Allegra (fexofenadine), or Xyzal (levocetirizine) daily as needed. May take twice a day during allergy  flares. May switch antihistamines every few months. May use Ryaltris  (olopatadine  + mometasone nasal spray combination) 1-2 sprays per nostril twice a day.  Nasal saline spray (i.e., Simply Saline) or nasal saline lavage (i.e., NeilMed) is recommended as needed and prior to medicated nasal sprays. May use Refresh re-wetting eye drops.   Recurrent infections Keep track of infections and antibiotics use.  Lactose intolerance May use lactose free milk or take a lactaid pill right before consuming anything with dairy.  Follow up in 3 months or sooner if needed.   I appreciate the opportunity to take part in Euva's care. Please do not hesitate to contact me with questions.  Sincerely,   Catha Clink, MD Allergy /Immunology [facility]

## 2024-02-05 NOTE — Patient Instructions (Addendum)
 Asthma flare/acute sinusitis No significant improvement with Augmentin  course.  Thus will use Azithromycin  2 tabs day 1, then 1 tab day 2-5. This will cover different respiratory bacteria than Augmentin  You have received steroid injection and oral course.  Do not feel you need any further steroids.  Once you have been off for at least 4 weeks then have your labs drawn.  Use Airsupra 2 puffs OR albuterol  2-4 puffs every 4 hours as needed for cough, shortness of breath, wheeze or chest tightness.   Airsupra is a combination rescue inhaler with albuterol  + budesonide  for more longer lasting results.  Samples provided.   Breathing/coughing Daily controller medication(s):continue Breztri  160 mcg 2 puffs twice a day with spacer and rinse mouth afterwards  Continue Singulair  (montelukast ) 10mg  daily at night. May use Airsupra 2 puffs OR albuterol  rescue inhaler 2 puffs every 4 to 6 hours as needed for shortness of breath, chest tightness, coughing, and wheezing. May use albuterol  rescue inhaler 2 puffs 5 to 15 minutes prior to strenuous physical activities. Monitor frequency of use - if you need to use it more than twice per week on a consistent basis let us  know.  Breathing control goals:  Full participation in all desired activities (may need albuterol  before activity) Albuterol  use two times or less a week on average (not counting use with activity) Cough interfering with sleep two times or less a month Oral steroids no more than once a year No hospitalizations   Environmental allergies 2023 bloodwork positive to dog, grass pollen and mold.  Use over the counter antihistamines such as Zyrtec (cetirizine), Claritin  (loratadine ), Allegra (fexofenadine), or Xyzal (levocetirizine) daily as needed. May take twice a day during allergy  flares. May switch antihistamines every few months. May use Ryaltris  (olopatadine  + mometasone nasal spray combination) 1-2 sprays per nostril twice a day.  Nasal saline  spray (i.e., Simply Saline) or nasal saline lavage (i.e., NeilMed) is recommended as needed and prior to medicated nasal sprays. May use Refresh re-wetting eye drops.   Recurrent infections Keep track of infections and antibiotics use.  Lactose intolerance May use lactose free milk or take a lactaid pill right before consuming anything with dairy.  Follow up in 3 months or sooner if needed.

## 2024-02-06 MED ORDER — RYALTRIS 665-25 MCG/ACT NA SUSP: 2.0000 | Freq: Two times a day (BID) | NASAL | 5 refills | Status: DC

## 2024-02-12 DIAGNOSIS — R269 Unspecified abnormalities of gait and mobility: Secondary | ICD-10-CM | POA: Diagnosis not present

## 2024-02-12 DIAGNOSIS — M79604 Pain in right leg: Secondary | ICD-10-CM | POA: Diagnosis not present

## 2024-02-12 DIAGNOSIS — M545 Low back pain, unspecified: Secondary | ICD-10-CM | POA: Diagnosis not present

## 2024-02-16 DIAGNOSIS — M79604 Pain in right leg: Secondary | ICD-10-CM | POA: Diagnosis not present

## 2024-02-16 DIAGNOSIS — R269 Unspecified abnormalities of gait and mobility: Secondary | ICD-10-CM | POA: Diagnosis not present

## 2024-02-16 DIAGNOSIS — M545 Low back pain, unspecified: Secondary | ICD-10-CM | POA: Diagnosis not present

## 2024-02-23 DIAGNOSIS — J4540 Moderate persistent asthma, uncomplicated: Secondary | ICD-10-CM | POA: Diagnosis not present

## 2024-02-25 ENCOUNTER — Telehealth: Payer: Self-pay | Admitting: Allergy

## 2024-02-25 LAB — ALLERGENS W/TOTAL IGE AREA 2
Alternaria Alternata IgE: 0.11 kU/L — AB
Aspergillus Fumigatus IgE: 0.17 kU/L — AB
Bermuda Grass IgE: 0.26 kU/L — AB
Cat Dander IgE: 0.23 kU/L — AB
Cedar, Mountain IgE: 0.1 kU/L — AB
Cladosporium Herbarum IgE: 0.37 kU/L — AB
Cockroach, German IgE: 0.11 kU/L — AB
Common Silver Birch IgE: <0.1 kU/L
Cottonwood IgE: 0.47 kU/L — AB
D Farinae IgE: 0.12 kU/L — AB
D Pteronyssinus IgE: 0.14 kU/L — AB
Dog Dander IgE: 1.33 kU/L — AB
Elm, American IgE: 0.22 kU/L — AB
IgE (Immunoglobulin E), Serum: 1409 [IU]/mL — ABNORMAL HIGH (ref 6–495)
Johnson Grass IgE: 0.29 kU/L — AB
Maple/Box Elder IgE: 0.25 kU/L — AB
Mouse Urine IgE: <0.1 kU/L
Oak, White IgE: 0.11 kU/L — AB
Pecan, Hickory IgE: 0.2 kU/L — AB
Penicillium Chrysogen IgE: <0.1 kU/L
Pigweed, Rough IgE: <0.1 kU/L
Ragweed, Short IgE: <0.1 kU/L
Sheep Sorrel IgE Qn: <0.1 kU/L
Timothy Grass IgE: 9.09 kU/L — AB
White Mulberry IgE: 0.13 kU/L — AB

## 2024-02-25 LAB — CBC WITH DIFFERENTIAL/PLATELET
Basophils Absolute: 0.1 10*3/uL (ref 0.0–0.2)
Basos: 2 %
EOS (ABSOLUTE): 0.1 10*3/uL (ref 0.0–0.4)
Eos: 2 %
Hematocrit: 44.1 % (ref 34.0–46.6)
Hemoglobin: 14.1 g/dL (ref 11.1–15.9)
Immature Grans (Abs): 0 10*3/uL (ref 0.0–0.1)
Immature Granulocytes: 0 %
Lymphocytes Absolute: 1.4 10*3/uL (ref 0.7–3.1)
Lymphs: 45 %
MCH: 30.1 pg (ref 26.6–33.0)
MCHC: 32 g/dL (ref 31.5–35.7)
MCV: 94 fL (ref 79–97)
Monocytes Absolute: 0.6 10*3/uL (ref 0.1–0.9)
Monocytes: 18 %
Neutrophils Absolute: 1.1 10*3/uL — ABNORMAL LOW (ref 1.4–7.0)
Neutrophils: 33 %
Platelets: 310 10*3/uL (ref 150–450)
RBC: 4.69 x10E6/uL (ref 3.77–5.28)
RDW: 13 % (ref 11.7–15.4)
WBC: 3.2 10*3/uL — ABNORMAL LOW (ref 3.4–10.8)

## 2024-02-25 NOTE — Telephone Encounter (Signed)
 Patient called and stated she would like Dr Robin Chaney to look at her blood work and let her know if she needs to schedule an appointment to see her soon. Patients stated that all her blood work is bad and she doesn't know what this means. Patients call back number is [phone]

## 2024-02-26 ENCOUNTER — Ambulatory Visit: Payer: Self-pay | Admitting: Internal Medicine

## 2024-02-26 NOTE — Progress Notes (Signed)
 Please relay the following to patient:  Your allergy  test results show that you have sensitivities to dog dander, tree pollen, outdoor mold, and grass pollen. You also have borderline reactions to dust mites, cats, indoor mold, and cockroach allergens. We'll be mailing you information on how to reduce exposure to these allergens at home and outdoors.  Your recent bloodwork shows a slightly low white blood cell count, which may be related to your recent use of prednisone .  As we think about long-term treatment options for your asthma, one possibility is Tezspire, an injection given once every four weeks. If you're interested in trying this medication, we can mail you more information and move forward with the next steps.  Please let us  know if you'd like to proceed.

## 2024-02-26 NOTE — Telephone Encounter (Signed)
 Dr. Cornel Diesel who ordered labs interpreted the results: "Please relay the following to patient:   Your allergy  test results show that you have sensitivities to dog dander, tree pollen, outdoor mold, and grass pollen. You also have borderline reactions to dust mites, cats, indoor mold, and cockroach allergens. We'll be mailing you information on how to reduce exposure to these allergens at home and outdoors.   Your recent bloodwork shows a slightly low white blood cell count, which may be related to your recent use of prednisone .   As we think about long-term treatment options for your asthma, one possibility is Tezspire, an injection given once every four weeks. If you're interested in trying this medication, we can mail you more information and move forward with the next steps.   Please let us  know if you'd like to proceed."

## 2024-03-30 ENCOUNTER — Other Ambulatory Visit (HOSPITAL_COMMUNITY): Payer: Self-pay

## 2024-03-31 DIAGNOSIS — Z01419 Encounter for gynecological examination (general) (routine) without abnormal findings: Secondary | ICD-10-CM | POA: Diagnosis not present

## 2024-03-31 DIAGNOSIS — Z1231 Encounter for screening mammogram for malignant neoplasm of breast: Secondary | ICD-10-CM | POA: Diagnosis not present

## 2024-04-15 ENCOUNTER — Other Ambulatory Visit: Payer: Self-pay | Admitting: Internal Medicine

## 2024-04-16 ENCOUNTER — Other Ambulatory Visit: Payer: Self-pay | Admitting: Internal Medicine

## 2024-04-21 ENCOUNTER — Other Ambulatory Visit: Payer: Self-pay

## 2024-04-21 ENCOUNTER — Ambulatory Visit: Admitting: Allergy

## 2024-04-21 ENCOUNTER — Encounter: Payer: Self-pay | Admitting: Allergy

## 2024-04-21 VITALS — BP 118/78 | HR 73 | Temp 98.4°F | Resp 17 | Ht 64.0 in | Wt 151.0 lb

## 2024-04-21 DIAGNOSIS — J4540 Moderate persistent asthma, uncomplicated: Secondary | ICD-10-CM | POA: Diagnosis not present

## 2024-04-21 DIAGNOSIS — J3081 Allergic rhinitis due to animal (cat) (dog) hair and dander: Secondary | ICD-10-CM

## 2024-04-21 DIAGNOSIS — J301 Allergic rhinitis due to pollen: Secondary | ICD-10-CM

## 2024-04-21 DIAGNOSIS — B999 Unspecified infectious disease: Secondary | ICD-10-CM

## 2024-04-21 DIAGNOSIS — J3089 Other allergic rhinitis: Secondary | ICD-10-CM

## 2024-04-21 DIAGNOSIS — H1013 Acute atopic conjunctivitis, bilateral: Secondary | ICD-10-CM

## 2024-04-21 MED ORDER — AIRSUPRA 90-80 MCG/ACT IN AERO: 2.0000 | INHALATION_SPRAY | RESPIRATORY_TRACT | 2 refills | Status: DC | PRN

## 2024-04-21 MED ORDER — AZITHROMYCIN 250 MG PO TABS: ORAL_TABLET | ORAL | 0 refills | Status: DC

## 2024-04-21 MED ORDER — AEROCHAMBER MV MISC: 2 refills | Status: AC

## 2024-04-21 NOTE — Patient Instructions (Addendum)
 Breathing  Start azithromycin  antibiotics as prescribed. If not improving, let us  know and will send in a low dose prednisone .  Tammy will be in touch with you regarding Tezspire injections. If approved it will be 1 shot every 4 weeks.  Regarding the spacer - you can buy one on amazon if it's cheaper than the pharmacy.   Daily controller medication(s): continue Breztri  2 puffs twice a day with spacer and rinse mouth afterwards. Continue Singulair  (montelukast ) 10mg  daily at night. May use Airsupra  rescue inhaler 2 puffs every 4 to 6 hours as needed for shortness of breath, chest tightness, coughing, and wheezing. Do not use more than 12 puffs in 24 hours. May use Airsupra  rescue inhaler 2 puffs 5 to 15 minutes prior to strenuous physical activities. Rinse mouth after each use.  Coupon given. Sample given. Monitor frequency of use - if you need to use it more than twice per week on a consistent basis let us  know.  OR  May use albuterol  rescue inhaler 2 puffs every 4 to 6 hours as needed for shortness of breath, chest tightness, coughing, and wheezing. May use albuterol  rescue inhaler 2 puffs 5 to 15 minutes prior to strenuous physical activities. Monitor frequency of use - if you need to use it more than twice per week on a consistent basis let us  know.  Breathing control goals:  Full participation in all desired activities (may need albuterol  before activity) Albuterol  use two times or less a week on average (not counting use with activity) Cough interfering with sleep two times or less a month Oral steroids no more than once a year No hospitalizations   Environmental allergies 2023 bloodwork positive to dog, grass pollen and mold.  Use over the counter antihistamines such as Zyrtec (cetirizine), Claritin  (loratadine ), Allegra (fexofenadine), or Xyzal (levocetirizine) daily as needed. May take twice a day during allergy  flares. May switch antihistamines every few months. May use Ryaltris   (olopatadine  + mometasone nasal spray combination) 1-2 sprays per nostril twice a day.  Nasal saline spray (i.e., Simply Saline) or nasal saline lavage (i.e., NeilMed) is recommended as needed and prior to medicated nasal sprays. May use Refresh re-wetting eye drops.   Recurrent infections Keep track of infections and antibiotics use.  Follow up in 2 months or sooner if needed.   Pet Allergen Avoidance: Contrary to popular opinion, there are no "hypoallergenic" breeds of dogs or cats. That is because people are not allergic to an animal's hair, but to an allergen found in the animal's saliva, dander (dead skin flakes) or urine. Pet allergy  symptoms typically occur within minutes. For some people, symptoms can build up and become most severe 8 to 12 hours after contact with the animal. People with severe allergies can experience reactions in public places if dander has been transported on the pet owners' clothing. Keeping an animal outdoors is only a partial solution, since homes with pets in the yard still have higher concentrations of animal allergens. Before getting a pet, ask your allergist to determine if you are allergic to animals. If your pet is already considered part of your family, try to minimize contact and keep the pet out of the bedroom and other rooms where you spend a great deal of time. As with dust mites, vacuum carpets often or replace carpet with a hardwood floor, tile or linoleum. High-efficiency particulate air (HEPA) cleaners can reduce allergen levels over time. While dander and saliva are the source of cat and dog allergens, urine is  the source of allergens from rabbits, hamsters, mice and is[place] pigs; so ask a non-allergic family member to clean the animal's cage. If you have a pet allergy , talk to your allergist about the potential for allergy  immunotherapy (allergy  shots). This strategy can often provide long-term relief. Reducing Pollen Exposure Pollen seasons: trees  (spring), grass (summer) and ragweed/weeds (fall). Keep windows closed in your home and car to lower pollen exposure.  Install air conditioning in the bedroom and throughout the house if possible.  Avoid going out in dry windy days - especially early morning. Pollen counts are highest between 5 - 10 AM and on dry, hot and windy days.  Save outside activities for late afternoon or after a heavy rain, when pollen levels are lower.  Avoid mowing of grass if you have grass pollen allergy . Be aware that pollen can also be transported indoors on people and pets.  Dry your clothes in an automatic dryer rather than hanging them outside where they might collect pollen.  Rinse hair and eyes before bedtime. Mold Control Mold and fungi can grow on a variety of surfaces provided certain temperature and moisture conditions exist.  Outdoor molds grow on plants, decaying vegetation and soil. The major outdoor mold, Alternaria and Cladosporium, are found in very high numbers during hot and dry conditions. Generally, a late summer - fall peak is seen for common outdoor fungal spores. Rain will temporarily lower outdoor mold spore count, but counts rise rapidly when the rainy period ends. The most important indoor molds are Aspergillus and Penicillium. Dark, humid and poorly ventilated basements are ideal sites for mold growth. The next most common sites of mold growth are the bathroom and the kitchen. Outdoor (Seasonal) Mold Control Use air conditioning and keep windows closed. Avoid exposure to decaying vegetation. Avoid leaf raking. Avoid grain handling. Consider wearing a face mask if working in moldy areas.  Indoor (Perennial) Mold Control  Maintain humidity below 50%. Get rid of mold growth on hard surfaces with water, detergent and, if necessary, 5% bleach (do not mix with other cleaners). Then dry the area completely. If mold covers an area more than 10 square feet, consider hiring an indoor environmental  professional. For clothing, washing with soap and water is best. If moldy items cannot be cleaned and dried, throw them away. Remove sources e.g. contaminated carpets. Repair and seal leaking roofs or pipes. Using dehumidifiers in damp basements may be helpful, but empty the water and clean units regularly to prevent mildew from forming. All rooms, especially basements, bathrooms and kitchens, require ventilation and cleaning to deter mold and mildew growth. Avoid carpeting on concrete or damp floors, and storing items in damp areas.

## 2024-04-21 NOTE — Progress Notes (Signed)
 Follow Up Note  RE: Robin Chaney MRN: [id number] DOB: 1951-05-24 Date of Office Visit: 04/21/2024  Referring provider: Perri Ronal PARAS, MD Primary care provider: Perri Ronal PARAS, MD  Chief Complaint: Follow-up (Coughing x 2  weeks) and Asthma  History of Present Illness: I had the pleasure of seeing Robin Chaney for a follow up visit at the [facility] on 04/21/2024. She is a 73 y.o. female, who is being followed for asthma, environmental allergies, recurrent infections. Her previous allergy  office visit was on 02/05/2024 with Dr. Jeneal. Today is a new complaint visit of asthma issues.  Discussed the use of AI scribe software for clinical note transcription with the patient, who gave verbal consent to proceed.    She has been experiencing a persistent cough for two weeks, which is more pronounced in the mornings and evenings. The cough is deep and occasionally productive with green sputum. No fever is present. The cough has made her worried about having another asthma attack.  Her medical history includes asthma and bronchitis, which were exacerbated during her recovery from lumbar interbody fusion surgery on December 05, 2023. During this period, she experienced severe asthma exacerbations that required two courses of antibiotics and 1 course of steroids. The initial antibiotic was ineffective, but a Z-Pak provided relief.  Her current medications include Breztri , which she uses two puffs twice daily, and Singulair . She has also been using Airsupra , which she finds more effective than albuterol , but her supply is limited due to cost/insurance coverage. She occasionally uses Ryaltris  nasal spray and takes Allegra for allergies.  She is concerned about the cost of her medications and the potential need for financial assistance programs. She has Medicare and [employer] but is unsure about coverage for certain medications.  Since her back surgery, she has  been recovering slowly, progressing from bed rest to using a walker, then a cane, and now walking independently. She reports ongoing back pain and nerve damage, which is improving. She is approximately four months into a year-long recovery process.   Does not like to take Mucinex.    2025 labs: Your allergy  test results show that you have sensitivities to dog dander, tree pollen, outdoor mold, and grass pollen. You also have borderline reactions to dust mites, cats, indoor mold, and cockroach allergens. We'll be mailing you information on how to reduce exposure to these allergens at home and outdoors.   Your recent bloodwork shows a slightly low white blood cell count, which may be related to your recent use of prednisone .   As we think about long-term treatment options for your asthma, one possibility is Tezspire, an injection given once every four weeks. If you're interested in trying this medication, we can mail you more information and move forward with the next steps.   Assessment and Plan: Robin Chaney is a 73 y.o. female with: Not well controlled moderate persistent asthma Past history - 2023 CXR showed peribronchial thickening. Using albuterol  once a day at least. Used Symbicort  for 1 week then stopped as she was concerned if vaginal bleeding was caused by this inhaler. Covid-19 infection 1 year ago. 2023 spirometry showed: possible restrictive disease with 6% improvement in FEV1 post bronchodilator treatment. Clinically feeling improved. No coughing post treatment.  Interim history - Mild exacerbation with cough and sputum production. Current episode less severe than previous but concerned due to recent spinal surgery and health status. Airsupra  helps better than albuterol  but too expensive.  Today's spirometry  some restriction. Start azithromycin  antibiotics as prescribed. If not improving, let us  know and will send in a low dose prednisone . Regarding the spacer - you can buy one on amazon  if it's cheaper than the pharmacy. Daily controller medication(s): continue Breztri  2 puffs twice a day with spacer and rinse mouth afterwards. Start PA for Tezspire injections every 4 weeks - pamphlet given. Continue Singulair  (montelukast ) 10mg  daily at night. May use Airsupra  rescue inhaler 2 puffs every 4 to 6 hours as needed for shortness of breath, chest tightness, coughing, and wheezing. Do not use more than 12 puffs in 24 hours. May use Airsupra  rescue inhaler 2 puffs 5 to 15 minutes prior to strenuous physical activities. Rinse mouth after each use.  Coupon given. Sample given. Monitor frequency of use - if you need to use it more than twice per week on a consistent basis let us  know.  OR  May use albuterol  rescue inhaler 2 puffs every 4 to 6 hours as needed for shortness of breath, chest tightness, coughing, and wheezing. May use albuterol  rescue inhaler 2 puffs 5 to 15 minutes prior to strenuous physical activities. Monitor frequency of use - if you need to use it more than twice per week on a consistent basis let us  know.   Recurrent infections Past history - Frequent bronchitis episodes. Up to date with flu, pneumonia and Covid-19 vaccines. 2023 bloodwork showed normal immunoglobulin levels, good protection against diptheria, tetanus and pneumoniae.  Interim history - required 2 rounds of antibiotics the last time. Start zpak. Keep track of infections and antibiotics use.  Seasonal allergic rhinitis due to pollen Allergic rhinitis due to animal dander Allergic rhinitis due to mold Allergic conjunctivitis of both eyes Past history - Used to be on AIT for 1 year with good benefit. 1 dog at home. 2023 bloodwork positive to dog, grass pollen and mold.  Interim history - stable. Use over the counter antihistamines such as Zyrtec (cetirizine), Claritin  (loratadine ), Allegra (fexofenadine), or Xyzal (levocetirizine) daily as needed. May take twice a day during allergy  flares. May switch  antihistamines every few months. May use Ryaltris  (olopatadine  + mometasone nasal spray combination) 1-2 sprays per nostril twice a day.  Nasal saline spray (i.e., Simply Saline) or nasal saline lavage (i.e., NeilMed) is recommended as needed and prior to medicated nasal sprays. May use Refresh re-wetting eye drops.    Return in about 2 months (around 06/22/2024).  Meds ordered this encounter  Medications   Albuterol -Budesonide  (AIRSUPRA ) 90-80 MCG/ACT AERO    Sig: Inhale 2 puffs into the lungs every 4 (four) hours as needed (coughing, wheezing, chest tightness). Do not exceed 12 puffs in 24 hours.    Dispense:  10.7 g    Refill:  2    BIN: [id number], PCN: [id number], GRP: [id number], ID [id number]   Spacer/Aero-Holding Chambers (AEROCHAMBER MV) inhaler    Sig: Use as instructed    Dispense:  1 each    Refill:  2   azithromycin  (ZITHROMAX  Z-PAK) 250 MG tablet    Sig: 2 tablets on day 1, then 1 tablet on days 2-5.    Dispense:  6 each    Refill:  0   Lab Orders  No laboratory test(s) ordered today    Diagnostics: Spirometry:  Tracings reviewed. Her effort: Good reproducible efforts. FVC: 1.92L FEV1: 1.45L, 68% predicted FEV1/FVC ratio: 76% Interpretation: Spirometry consistent with possible restrictive disease.  Please see scanned spirometry results for details.  Results discussed with patient/family.   Medication  List:  Current Outpatient Medications  Medication Sig Dispense Refill   albuterol  (VENTOLIN  HFA) 108 (90 Base) MCG/ACT inhaler Inhale 2 puffs into the lungs every 6 (six) hours as needed for wheezing or shortness of breath. 54 g 3   Albuterol -Budesonide  (AIRSUPRA ) 90-80 MCG/ACT AERO Inhale 2 puffs into the lungs every 4 (four) hours as needed (coughing, wheezing, chest tightness). Do not exceed 12 puffs in 24 hours. 10.7 g 2   ALPRAZolam  (XANAX ) 0.5 MG tablet TAKE 1 TABLET(0.5 MG) BY MOUTH TWICE DAILY AS NEEDED FOR ANXIETY 60 tablet 3   azithromycin  (ZITHROMAX  Z-PAK) 250 MG  tablet 2 tablets on day 1, then 1 tablet on days 2-5. 6 each 0   Budeson-Glycopyrrol-Formoterol  (BREZTRI  AEROSPHERE) 160-9-4.8 MCG/ACT AERO Inhale 2 puffs into the lungs in the morning and at bedtime. with spacer and rinse mouth afterwards. 3 each 2   Cholecalciferol (VITAMIN D3 PO) Take 1,000 Units by mouth at bedtime.     escitalopram  (LEXAPRO ) 20 MG tablet TAKE 1 TABLET DAILY 90 tablet 3   fexofenadine (ALLEGRA) 180 MG tablet Take 180 mg by mouth daily as needed for allergies.     methocarbamol  (ROBAXIN ) 500 MG tablet Take 1 tablet (500 mg total) by mouth every 6 (six) hours as needed for muscle spasms. 30 tablet 3   montelukast  (SINGULAIR ) 10 MG tablet TAKE 1 TABLET AT BEDTIME 90 tablet 3   Olopatadine -Mometasone (RYALTRIS ) 665-25 MCG/ACT SUSP Place 2 sprays into the nose in the morning and at bedtime. 29 g 5   rosuvastatin  (CRESTOR ) 5 MG tablet TAKE 1 TABLET DAILY 90 tablet 3   Spacer/Aero-Holding Chambers (AEROCHAMBER MV) inhaler Use as instructed 1 each 2   tretinoin  (RETIN-A ) 0.1 % cream APPLY TO AFFECTED AREAS AT BEDTIME AS DIRECTED 45 g 1   zolpidem  (AMBIEN ) 10 MG tablet Take 1 tablet (10 mg total) by mouth at bedtime. 90 tablet 1   gabapentin  (NEURONTIN ) 300 MG capsule Take 2 capsules by mouth at bedtime or up to 3 times daily as needed for pain (Patient not taking: Reported on 04/21/2024) 100 capsule 3   No current facility-administered medications for this visit.   Allergies: Allergies  Allergen Reactions   Codeine Nausea Only   I reviewed her past medical history, social history, family history, and environmental history and no significant changes have been reported from her previous visit.  Review of Systems  Constitutional:  Negative for appetite change, chills, fever and unexpected weight change.  HENT:  Negative for congestion and rhinorrhea.   Eyes:  Negative for itching.  Respiratory:  Positive for cough, chest tightness, shortness of breath and wheezing.    Cardiovascular:  Negative for chest pain.  Gastrointestinal:  Negative for abdominal pain.  Genitourinary:  Negative for difficulty urinating.  Skin:  Negative for rash.  Allergic/Immunologic: Positive for environmental allergies.    Objective: BP 118/78 (BP Location: Right Arm, Patient Position: Sitting)   Pulse 73   Temp 98.4 F (36.9 C) (Temporal)   Resp 17   Ht 5' 4 (1.626 m)   Wt 151 lb (68.5 kg)   SpO2 95%   BMI 25.92 kg/m  Body mass index is 25.92 kg/m. Physical Exam Vitals and nursing note reviewed.  Constitutional:      Appearance: Normal appearance. She is well-developed.  HENT:     Head: Normocephalic and atraumatic.     Right Ear: Tympanic membrane and external ear normal.     Left Ear: Tympanic membrane and external ear normal.  Nose: Nose normal.     Mouth/Throat:     Mouth: Mucous membranes are moist.     Pharynx: Oropharynx is clear.  Eyes:     Conjunctiva/sclera: Conjunctivae normal.  Cardiovascular:     Rate and Rhythm: Normal rate and regular rhythm.     Heart sounds: Normal heart sounds. No murmur heard.    No friction rub. No gallop.  Pulmonary:     Effort: Pulmonary effort is normal.     Breath sounds: Normal breath sounds. No wheezing, rhonchi or rales.  Musculoskeletal:     Cervical back: Neck supple.  Skin:    General: Skin is warm.     Findings: No rash.  Neurological:     Mental Status: She is alert and oriented to person, place, and time.  Psychiatric:        Behavior: Behavior normal.    Previous notes and tests were reviewed. The plan was reviewed with the patient/family, and all questions/concerned were addressed.  It was my pleasure to see Tansy today and participate in her care. Please feel free to contact me with any questions or concerns.  Sincerely,  Orlan Cramp, DO Allergy  & Immunology  [facility]  [place] office: [phone] [facility] office: [phone]

## 2024-04-27 ENCOUNTER — Telehealth: Payer: Self-pay | Admitting: *Deleted

## 2024-04-27 ENCOUNTER — Other Ambulatory Visit: Payer: Self-pay

## 2024-04-27 ENCOUNTER — Other Ambulatory Visit (HOSPITAL_COMMUNITY): Payer: Self-pay

## 2024-04-27 MED ORDER — TEZSPIRE 210 MG/1.91ML ~~LOC~~ SOAJ
210.0000 mg | SUBCUTANEOUS | 11 refills | Status: AC
  Filled 2024-05-06: qty 1.91, 28d supply, fill #0
  Filled 2024-06-01: qty 1.91, 28d supply, fill #1
  Filled 2024-06-28: qty 1.91, 28d supply, fill #2
  Filled 2024-07-30: qty 1.91, 28d supply, fill #3
  Filled 2024-08-26: qty 1.91, 28d supply, fill #4
  Filled 2024-10-04: qty 1.91, 28d supply, fill #5
  Filled 2024-10-27 – 2024-11-11 (×4): qty 1.91, 28d supply, fill #6

## 2024-04-27 NOTE — Telephone Encounter (Signed)
-----   Message from Robin Chaney Cramp sent at 04/21/2024  4:14 PM EDT ----- Please do PA for Tezspire  every 4 weeks injections for asthma. Thank you.

## 2024-04-27 NOTE — Telephone Encounter (Signed)
 Spoke to patient and advised submit to [facility] and will need to get initial dose in clinic then she can self admin at home

## 2024-04-27 NOTE — Telephone Encounter (Signed)
 L/m for patient to advise approval and submit to [facility] with $0 copay. She has either met her OOP for the year or signed up for the [facility]

## 2024-05-05 DIAGNOSIS — M5416 Radiculopathy, lumbar region: Secondary | ICD-10-CM | POA: Diagnosis not present

## 2024-05-05 DIAGNOSIS — Z6826 Body mass index (BMI) 26.0-26.9, adult: Secondary | ICD-10-CM | POA: Diagnosis not present

## 2024-05-06 ENCOUNTER — Other Ambulatory Visit: Payer: Self-pay

## 2024-05-06 ENCOUNTER — Other Ambulatory Visit (HOSPITAL_COMMUNITY): Payer: Self-pay

## 2024-05-06 NOTE — Progress Notes (Signed)
 Specialty Pharmacy Initiation Note   Robin Chaney is a 73 y.o. female who will be followed by the specialty pharmacy service for RxSp Asthma/COPD    Review of administration, indication, effectiveness, safety, potential side effects, storage/disposable, and missed dose instructions occurred today for patient's specialty medication(s) Tezepelumab -ekko (Tezspire )     Patient/Caregiver did not have any additional questions or concerns.   Patient's therapy is appropriate to: Initiate    Goals Addressed             This Visit's Progress    Reduce disease symptoms including coughing and shortness of breath       Patient is initiating therapy. Patient will maintain adherence         Delon CHRISTELLA Brow Specialty Pharmacist

## 2024-05-06 NOTE — Progress Notes (Signed)
 Specialty Pharmacy Initial Fill Coordination Note  Robin Chaney is a 73 y.o. female contacted today regarding initial fill of specialty medication(s) Tezepelumab -ekko (Tezspire )   Patient requested Courier to Provider Office   Delivery date: 05/11/24   Verified address: [address] [place], [place] [address]   Medication will be filled on 08/04.   Patient is aware of $0.00 copayment.

## 2024-05-07 ENCOUNTER — Telehealth: Payer: Self-pay

## 2024-05-07 NOTE — Telephone Encounter (Signed)
*  AA  Pharmacy Patient Advocate Encounter   Received notification from Fax that prior authorization for Airsupra  90-80MCG/ACT aerosol  is required/requested.   Insurance verification completed.   The patient is insured through [facility] .   Per test claim: PA required; PA submitted to above mentioned insurance via CoverMyMeds Key/confirmation #/EOC [id number] Status is pending

## 2024-05-10 ENCOUNTER — Other Ambulatory Visit (HOSPITAL_COMMUNITY): Payer: Self-pay

## 2024-05-10 NOTE — Telephone Encounter (Signed)
.  Pharmacy Patient Advocate Encounter  Received notification from [facility] that Prior Authorization for Airsupra  90-80MCG/ACT aerosol  has been APPROVED from 05/07/2024 to 05/07/2025. Ran test claim, Copay is $0.00. This test claim was processed through [facility]- copay amounts may vary at other pharmacies due to pharmacy/plan contracts, or as the patient moves through the different stages of their insurance plan.   PA #/Case ID/Reference #: [id number]

## 2024-05-13 ENCOUNTER — Other Ambulatory Visit (HOSPITAL_COMMUNITY): Payer: Self-pay

## 2024-05-13 ENCOUNTER — Other Ambulatory Visit: Payer: Self-pay

## 2024-05-13 ENCOUNTER — Ambulatory Visit

## 2024-05-13 DIAGNOSIS — J4540 Moderate persistent asthma, uncomplicated: Secondary | ICD-10-CM | POA: Diagnosis not present

## 2024-05-13 MED ORDER — TEZEPELUMAB-EKKO 210 MG/1.91ML ~~LOC~~ SOSY
210.0000 mg | PREFILLED_SYRINGE | SUBCUTANEOUS | Status: AC
  Administered 2024-05-13: 210 mg via SUBCUTANEOUS

## 2024-05-13 NOTE — Progress Notes (Signed)
 Immunotherapy   Patient Details  Name: Robin Chaney MRN: [id number] Date of Birth: [birth year]  05/13/2024  Robin Chaney started injections for  Tezspire  Following schedule: Every twenty eight days.  Frequency:Every four weeks.  Epi-Pen:Not needed.  Consent signed in office and patient instructions given on how to successfully administer Tezspire  in the abdomen and thigh. 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 Patient successfully administer herself with the Tezspire  in her lower right abdomen. Patient sat in room twenty seven for ten minutes without an issue. Patient left early as she had another appointment to get to.    Robin Chaney 05/13/2024, 11:09 AM

## 2024-05-28 ENCOUNTER — Other Ambulatory Visit (HOSPITAL_COMMUNITY): Payer: Self-pay

## 2024-06-01 ENCOUNTER — Other Ambulatory Visit: Payer: Self-pay | Admitting: Pharmacy Technician

## 2024-06-01 ENCOUNTER — Other Ambulatory Visit: Payer: Self-pay

## 2024-06-01 NOTE — Progress Notes (Signed)
 Specialty Pharmacy Refill Coordination Note  Robin Chaney is a 73 y.o. female contacted today regarding refills of specialty medication(s) Tezepelumab -ekko (TEZSPIRE )   Patient requested Delivery   Delivery date: 06/04/24   Verified address: [address]  [place]   Medication will be filled on 06/03/24.

## 2024-06-02 ENCOUNTER — Other Ambulatory Visit: Payer: Self-pay

## 2024-06-03 ENCOUNTER — Other Ambulatory Visit: Payer: Self-pay

## 2024-06-17 ENCOUNTER — Encounter: Payer: Self-pay | Admitting: Family Medicine

## 2024-06-17 ENCOUNTER — Telehealth: Admitting: Family Medicine

## 2024-06-17 DIAGNOSIS — E739 Lactose intolerance, unspecified: Secondary | ICD-10-CM | POA: Diagnosis not present

## 2024-06-17 DIAGNOSIS — H1013 Acute atopic conjunctivitis, bilateral: Secondary | ICD-10-CM | POA: Diagnosis not present

## 2024-06-17 DIAGNOSIS — J45909 Unspecified asthma, uncomplicated: Secondary | ICD-10-CM | POA: Diagnosis not present

## 2024-06-17 DIAGNOSIS — B999 Unspecified infectious disease: Secondary | ICD-10-CM | POA: Diagnosis not present

## 2024-06-17 DIAGNOSIS — R21 Rash and other nonspecific skin eruption: Secondary | ICD-10-CM

## 2024-06-17 DIAGNOSIS — J301 Allergic rhinitis due to pollen: Secondary | ICD-10-CM

## 2024-06-17 DIAGNOSIS — J4551 Severe persistent asthma with (acute) exacerbation: Secondary | ICD-10-CM

## 2024-06-17 MED ORDER — PREDNISONE 10 MG PO TABS: ORAL_TABLET | ORAL | 0 refills | Status: DC

## 2024-06-17 NOTE — Progress Notes (Signed)
 RE: Robin Chaney MRN: [id number] DOB: 07-08-1951 Date of MyChart Video Visit: 06/17/2024  Referring provider: Perri Ronal PARAS, MD Primary care provider: Perri Ronal PARAS, MD  Chief Complaint: Rash   MyChart video Follow Up Visit via MyChart video: I connected with Robin Chaney for a follow up on 06/17/24 by MyChart video and verified that I am speaking with the correct person using two identifiers.   I discussed the limitations, risks, security and privacy concerns of performing an evaluation and management service by MyChart video and the availability of in person appointments. I also discussed with the patient that there may be a patient responsible charge related to this service. The patient expressed understanding and agreed to proceed.  Patient is at home.  Provider is at the home office.  Visit start time: 238 Visit end time: 64 Insurance consent/check in by: Regulatory affairs officer consent and medical assistant/nurse: Asad Keeven  History of Present Illness: She is a 73 y.o. female, who is being followed for asthma, allergic rhinitis, and recurrent infection. Her previous allergy  office visit was on 04/21/2024 with Dr. Luke.   At today's visit, she reports that she began to experience a rash occurring mainly on her arms also scattered to other areas of her body about 1 to 2 weeks ago.  She reports this rash is raised, red, and extremely pruritic which seems to be worse at night.  She continues an antihistamine once or twice a day rotating between cetirizine, levocetirizine, and fexofenadine with no relief of symptoms.  She reports that she has not experienced a similar rash previously.  She denies new medications, new foods, new personal care products, or insect stings.  She denies recent illness, fever, sweats, chills, or sick contacts.  She does report that she is at her sister's house at this time with a dog, however, she reports this dog has not previously caused any rash or  itch issues.  She denies gastrointestinal symptoms, however, does report that she began to experience slight wheezing last night for which she used Breztri  with relief of symptoms.  Allergic conjunctivitis is reported as moderately well-controlled with occasional red and itchy eyes for which she uses an over-the-counter allergy  eyedrop with relief of symptoms.  Asthma is reported as moderately well-controlled with wheezing that began last night as the main symptom.  She continues Breztri  2 puffs twice a day with a spacer, montelukast  10 mg once a day, and occasionally uses albuterol  or Airsupra .  She reports weather changes are frequently a trigger for her asthma symptoms.  She recently received her second tezspire  injection with no large or local reactions.  She reports a significant decrease in her symptoms of asthma while continuing on tezspire  injections.  She reports occasional reflux for which she continues Prilosec daily with relief of symptoms.  She denies infections requiring steroid or antibiotics since her last visit to this clinic.  She continues to avoid foods containing lactose.  Her current medications are listed in the chart.  Assessment and Plan: Robin Chaney is a 73 y.o. female with: Patient Instructions  Asthma Continue Brezrti 2 puffs twice a day with a spacer to prevent cough or wheeze Continue montelukast  10 mg once a day to prevent cough or wheeze Continue albuterol  2 puffs once every 4 hours as needed for cough or wheeze. You may use albuterol  2 puffs 5-15 minutes before activity to decrease cough or wheeze OR use AirSupra  2 puffs when needed. Do not use Air Supra more than  12 puffs in a 24 hour time span. Use either albuterol  OR AirSupra  but not both. Continue Tezspire  injections for asthma control Begin prednisone  for wheezing as listed below  Allergic rhinitis Continue allergen avoidance measures directed toward grass pollen, tree pollen, mold, dust mites, cat, dog, and  cockroach Continue an antihistamine once a day if needed for a runny nose or itch Continue Ryaltris  2 sprays in each nostril up to twice a day if needed for nasal symptoms Consider saline nasal rinses as needed for nasal symptoms. Use this before any medicated nasal sprays for best result Consider allergen immunotherapy if your symptoms are not well controlled with the treatment plan as lsited above  Allergic conjunctivitis Some over the counter eye drops include Pataday  one drop in each eye once a day as needed for red, itchy eyes OR Zaditor one drop in each eye twice a day as needed for red itchy eyes. Avoid eye drops that say red eye relief as they may contain medications that dry out your eyes.   Recurrent infection Continue to keep track of infections, antibiotics, and steroid use  Lactose intolerance Continue to avoid products containing lactose or take lactacid prior to consumption of foods containing lactose  Rash/Hives (urticaria) Take the least amount of medications while remaining hive free Cetirizine (Zyrtec) 10mg  twice a day and famotidine  (Pepcid ) 20 mg twice a day. If no symptoms for 7-14 days then decrease to. Cetirizine (Zyrtec) 10mg  twice a day and famotidine  (Pepcid ) 20 mg once a day.  If no symptoms for 7-14 days then decrease to. Cetirizine (Zyrtec) 10mg  twice a day.  If no symptoms for 7-14 days then decrease to. Cetirizine (Zyrtec) 10mg  once a day.  Begin prednisone  10 mg tablets. Take 2 tablets twice a day for 3 days, then take 2 tablets once a day for 1 day, then take 1 tablet on the 5th day, then stop  If your symptoms re-occur, begin a journal of events that occurred for up to 6 hours before your symptoms began including foods and beverages consumed, soaps or perfumes you had contact with, and medications.  If the rash worsens or does not improve then call the clinic. Can consider lab work after week 6.  Call the clinic if this treatment plan is not working well  for you  Follow up in the clinic in 2 months or sooner if needed.    Return in about 2 months (around 08/17/2024), or if symptoms worsen or fail to improve.  Meds ordered this encounter  Medications   predniSONE  (DELTASONE ) 10 MG tablet    Sig: Begin prednisone  10 mg tablets. Take 2 tablets twice a day for 3 days, then take 2 tablets once a day for 1 day, then take 1 tablet on the 5th day, then stop    Dispense:  15 tablet    Refill:  0   Lab Orders  No laboratory test(s) ordered today    Diagnostics: Spirometry at follow-up  Medication List:  Current Outpatient Medications  Medication Sig Dispense Refill   albuterol  (VENTOLIN  HFA) 108 (90 Base) MCG/ACT inhaler Inhale 2 puffs into the lungs every 6 (six) hours as needed for wheezing or shortness of breath. 54 g 3   Albuterol -Budesonide  (AIRSUPRA ) 90-80 MCG/ACT AERO Inhale 2 puffs into the lungs every 4 (four) hours as needed (coughing, wheezing, chest tightness). Do not exceed 12 puffs in 24 hours. 10.7 g 2   ALPRAZolam  (XANAX ) 0.5 MG tablet TAKE 1 TABLET(0.5 MG) BY MOUTH TWICE  DAILY AS NEEDED FOR ANXIETY 60 tablet 3   Budeson-Glycopyrrol-Formoterol  (BREZTRI  AEROSPHERE) 160-9-4.8 MCG/ACT AERO Inhale 2 puffs into the lungs in the morning and at bedtime. with spacer and rinse mouth afterwards. 3 each 2   Cholecalciferol (VITAMIN D3 PO) Take 1,000 Units by mouth at bedtime.     escitalopram  (LEXAPRO ) 20 MG tablet TAKE 1 TABLET DAILY 90 tablet 3   fexofenadine (ALLEGRA) 180 MG tablet Take 180 mg by mouth daily as needed for allergies.     methocarbamol  (ROBAXIN ) 500 MG tablet Take 1 tablet (500 mg total) by mouth every 6 (six) hours as needed for muscle spasms. 30 tablet 3   montelukast  (SINGULAIR ) 10 MG tablet TAKE 1 TABLET AT BEDTIME 90 tablet 3   predniSONE  (DELTASONE ) 10 MG tablet Begin prednisone  10 mg tablets. Take 2 tablets twice a day for 3 days, then take 2 tablets once a day for 1 day, then take 1 tablet on the 5th day, then  stop 15 tablet 0   rosuvastatin  (CRESTOR ) 5 MG tablet TAKE 1 TABLET DAILY 90 tablet 3   Spacer/Aero-Holding Chambers (AEROCHAMBER MV) inhaler Use as instructed 1 each 2   Tezepelumab -ekko (TEZSPIRE ) 210 MG/1. SOAJ Inject 210 mg into the skin every 28 (twenty-eight) days. 1.91 mL 11   zolpidem  (AMBIEN ) 10 MG tablet Take 1 tablet (10 mg total) by mouth at bedtime. 90 tablet 1   azithromycin  (ZITHROMAX  Z-PAK) 250 MG tablet 2 tablets on day 1, then 1 tablet on days 2-5. (Patient not taking: Reported on 06/17/2024) 6 each 0   gabapentin  (NEURONTIN ) 300 MG capsule Take 2 capsules by mouth at bedtime or up to 3 times daily as needed for pain (Patient not taking: Reported on 04/21/2024) 100 capsule 3   Olopatadine -Mometasone (RYALTRIS ) 665-25 MCG/ACT SUSP Place 2 sprays into the nose in the morning and at bedtime. (Patient not taking: Reported on 06/17/2024) 29 g 5   tretinoin  (RETIN-A ) 0.1 % cream APPLY TO AFFECTED AREAS AT BEDTIME AS DIRECTED 45 g 1   Current Facility-Administered Medications  Medication Dose Route Frequency Provider Last Rate Last Admin   tezepelumab -ekko (TEZSPIRE ) 210 MG/1. syringe 210 mg  210 mg Subcutaneous Q28 days Luke Orlan HERO, DO   210 mg at 05/13/24 1120   Allergies: Allergies  Allergen Reactions   Codeine Nausea Only   I reviewed her past medical history, social history, family history, and environmental history and no significant changes have been reported from previous visit on 04/21/2024.   Objective: Physical Exam Not obtained as encounter was done via MyChart video.  Previous notes and tests were reviewed.  I discussed the assessment and treatment plan with the patient. The patient was provided an opportunity to ask questions and all were answered. The patient agreed with the plan and demonstrated an understanding of the instructions.   The patient was advised to call back or seek an in-person evaluation if the symptoms worsen or if the condition fails to  improve as anticipated.  I provided 27 minutes of non-face-to-face time during this encounter.  It was my pleasure to participate in Tressa Stroupe's care today. Please feel free to contact me with any questions or concerns.   Sincerely,  [name], FNP

## 2024-06-17 NOTE — Patient Instructions (Addendum)
 Asthma Continue Brezrti 2 puffs twice a day with a spacer to prevent cough or wheeze Continue montelukast  10 mg once a day to prevent cough or wheeze Continue albuterol  2 puffs once every 4 hours as needed for cough or wheeze. You may use albuterol  2 puffs 5-15 minutes before activity to decrease cough or wheeze OR use AirSupra  2 puffs when needed. Do not use Air Supra more than 12 puffs in a 24 hour time span. Use either albuterol  OR AirSupra  but not both. Continue Tezspire  injections for asthma control Begin prednisone  for wheezing as listed below  Allergic rhinitis Continue allergen avoidance measures directed toward grass pollen, tree pollen, mold, dust mites, cat, dog, and cockroach Continue an antihistamine once a day if needed for a runny nose or itch Continue Ryaltris  2 sprays in each nostril up to twice a day if needed for nasal symptoms Consider saline nasal rinses as needed for nasal symptoms. Use this before any medicated nasal sprays for best result Consider allergen immunotherapy if your symptoms are not well controlled with the treatment plan as lsited above  Allergic conjunctivitis Some over the counter eye drops include Pataday  one drop in each eye once a day as needed for red, itchy eyes OR Zaditor one drop in each eye twice a day as needed for red itchy eyes. Avoid eye drops that say red eye relief as they may contain medications that dry out your eyes.   Recurrent infection Continue to keep track of infections, antibiotics, and steroid use  Lactose intolerance Continue to avoid products containing lactose or take lactacid prior to consumption of foods containing lactose  Rash/Hives (urticaria) Take the least amount of medications while remaining hive free Cetirizine (Zyrtec) 10mg  twice a day and famotidine  (Pepcid ) 20 mg twice a day. If no symptoms for 7-14 days then decrease to. Cetirizine (Zyrtec) 10mg  twice a day and famotidine  (Pepcid ) 20 mg once a day.  If no  symptoms for 7-14 days then decrease to. Cetirizine (Zyrtec) 10mg  twice a day.  If no symptoms for 7-14 days then decrease to. Cetirizine (Zyrtec) 10mg  once a day.  Begin prednisone  10 mg tablets. Take 2 tablets twice a day for 3 days, then take 2 tablets once a day for 1 day, then take 1 tablet on the 5th day, then stop  If your symptoms re-occur, begin a journal of events that occurred for up to 6 hours before your symptoms began including foods and beverages consumed, soaps or perfumes you had contact with, and medications.  If the rash worsens or does not improve then call the clinic. Can consider lab work after week 6.  Call the clinic if this treatment plan is not working well for you  Follow up in the clinic in 2 months or sooner if needed.  Reducing Pollen Exposure The American Academy of Allergy , Asthma and Immunology suggests the following steps to reduce your exposure to pollen during allergy  seasons. Do not hang sheets or clothing out to dry; pollen may collect on these items. Do not mow lawns or spend time around freshly cut grass; mowing stirs up pollen. Keep windows closed at night.  Keep car windows closed while driving. Minimize morning activities outdoors, a time when pollen counts are usually at their highest. Stay indoors as much as possible when pollen counts or humidity is high and on windy days when pollen tends to remain in the air longer. Use air conditioning when possible.  Many air conditioners have filters that trap the  pollen spores. Use a HEPA room air filter to remove pollen form the indoor air you breathe.  Control of Mold Allergen Mold and fungi can grow on a variety of surfaces provided certain temperature and moisture conditions exist.  Outdoor molds grow on plants, decaying vegetation and soil.  The major outdoor mold, Alternaria and Cladosporium, are found in very high numbers during hot and dry conditions.  Generally, a late Summer - Fall peak is seen for  common outdoor fungal spores.  Rain will temporarily lower outdoor mold spore count, but counts rise rapidly when the rainy period ends.  The most important indoor molds are Aspergillus and Penicillium.  Dark, humid and poorly ventilated basements are ideal sites for mold growth.  The next most common sites of mold growth are the bathroom and the kitchen.  Outdoor [employer] Use air conditioning and keep windows closed Avoid exposure to decaying vegetation. Avoid leaf raking. Avoid grain handling. Consider wearing a face mask if working in moldy areas.  Indoor Mold Control Maintain humidity below 50%. Clean washable surfaces with 5% bleach solution. Remove sources e.g. Contaminated carpets.   Control of Dust Mite Allergen Dust mites play a major role in allergic asthma and rhinitis. They occur in environments with high humidity wherever human skin is found. Dust mites absorb humidity from the atmosphere (ie, they do not drink) and feed on organic matter (including shed human and animal skin). Dust mites are a microscopic type of insect that you cannot see with the naked eye. High levels of dust mites have been detected from mattresses, pillows, carpets, upholstered furniture, bed covers, clothes, soft toys and any woven material. The principal allergen of the dust mite is found in its feces. A gram of dust may contain 1,000 mites and 250,000 fecal particles. Mite antigen is easily measured in the air during house cleaning activities. Dust mites do not bite and do not cause harm to humans, other than by triggering allergies/asthma.  Ways to decrease your exposure to dust mites in your home:  1. Encase mattresses, box springs and pillows with a mite-impermeable barrier or cover  2. Wash sheets, blankets and drapes weekly in hot water (130 F) with detergent and dry them in a dryer on the hot setting.  3. Have the room cleaned frequently with a vacuum cleaner and a damp dust-mop. For carpeting  or rugs, vacuuming with a vacuum cleaner equipped with a high-efficiency particulate air (HEPA) filter. The dust mite allergic individual should not be in a room which is being cleaned and should wait 1 hour after cleaning before going into the room.  4. Do not sleep on upholstered furniture (eg, couches).  5. If possible removing carpeting, upholstered furniture and drapery from the home is ideal. Horizontal blinds should be eliminated in the rooms where the person spends the most time (bedroom, study, television room). Washable vinyl, roller-type shades are optimal.  6. Remove all non-washable stuffed toys from the bedroom. Wash stuffed toys weekly like sheets and blankets above.  7. Reduce indoor humidity to less than 50%. Inexpensive humidity monitors can be purchased at most hardware stores. Do not use a humidifier as can make the problem worse and are not recommended.  Control of Dog or Cat Allergen Avoidance is the best way to manage a dog or cat allergy . If you have a dog or cat and are allergic to dog or cats, consider removing the dog or cat from the home. If you have a dog or cat  but don't want to find it a new home, or if your family wants a pet even though someone in the household is allergic, here are some strategies that may help keep symptoms at bay:  Keep the pet out of your bedroom and restrict it to only a few rooms. Be advised that keeping the dog or cat in only one room will not limit the allergens to that room. Don't pet, hug or kiss the dog or cat; if you do, wash your hands with soap and water. High-efficiency particulate air (HEPA) cleaners run continuously in a bedroom or living room can reduce allergen levels over time. Regular use of a high-efficiency vacuum cleaner or a central vacuum can reduce allergen levels. Giving your dog or cat a bath at least once a week can reduce airborne allergen.  Control of Cockroach Allergen Cockroach allergen has been identified as an  important cause of acute attacks of asthma, especially in urban settings.  There are fifty-five species of cockroach that exist in the [place] , however only three, the [place], [place] and [place] species produce allergen that can affect patients with Asthma.  Allergens can be obtained from fecal particles, egg casings and secretions from cockroaches.    Remove food sources. Reduce access to water. Seal access and entry points. Spray runways with 0.5-1% Diazinon or Chlorpyrifos Blow boric acid power under stoves and refrigerator. Place bait stations (hydramethylnon) at feeding sites.

## 2024-06-24 ENCOUNTER — Other Ambulatory Visit (HOSPITAL_COMMUNITY): Payer: Self-pay

## 2024-06-28 ENCOUNTER — Other Ambulatory Visit (HOSPITAL_COMMUNITY): Payer: Self-pay

## 2024-06-28 ENCOUNTER — Other Ambulatory Visit: Payer: Self-pay

## 2024-06-28 NOTE — Progress Notes (Signed)
 Specialty Pharmacy Refill Coordination Note  Spoke with Robin Chaney  Robin Chaney is a 73 y.o. female contacted today regarding refills of specialty medication(s) Tezepelumab -ekko (TEZSPIRE )  Doses on hand: 0  Injection date: 07/13/24   Patient requested: Delivery   Delivery date: 07/08/24   Verified address: [address] [place] [address]  Medication will be filled on 07/07/24.

## 2024-07-07 ENCOUNTER — Other Ambulatory Visit: Payer: Self-pay

## 2024-07-12 ENCOUNTER — Other Ambulatory Visit: Payer: Self-pay | Admitting: Internal Medicine

## 2024-07-12 MED ORDER — ZOLPIDEM TARTRATE 10 MG PO TABS: 10.0000 mg | ORAL_TABLET | Freq: Every day | ORAL | 1 refills | Status: AC

## 2024-07-12 NOTE — Telephone Encounter (Signed)
 Copied from CRM [id number]. Topic: Clinical - Medication Refill >> Jul 12, 2024 11:21 AM Rosina BIRCH wrote: Medication: zolpidem   Has the patient contacted their pharmacy? Yes (Agent: If no, request that the patient contact the pharmacy for the refill. If patient does not wish to contact the pharmacy document the reason why and proceed with request.) (Agent: If yes, when and what did the pharmacy advise?)  This is the patient's preferred pharmacy:  [facility] - [place], [place] - [address] [place] [address] Phone: [phone] Fax: [phone]  Is this the correct pharmacy for this prescription? Yes If no, delete pharmacy and type the correct one.   Has the prescription been filled recently? No  Is the patient out of the medication? Yes  Has the patient been seen for an appointment in the last year OR does the patient have an upcoming appointment? Yes  Can we respond through MyChart? Yes  Agent: Please be advised that Rx refills may take up to 3 business days. We ask that you follow-up with your pharmacy.

## 2024-07-21 DIAGNOSIS — M5416 Radiculopathy, lumbar region: Secondary | ICD-10-CM | POA: Diagnosis not present

## 2024-07-21 DIAGNOSIS — Z6826 Body mass index (BMI) 26.0-26.9, adult: Secondary | ICD-10-CM | POA: Diagnosis not present

## 2024-07-30 ENCOUNTER — Other Ambulatory Visit: Payer: Self-pay

## 2024-07-30 NOTE — Progress Notes (Signed)
 Specialty Pharmacy Refill Coordination Note  Robin Chaney is a 73 y.o. female contacted today regarding refills of specialty medication(s) Tezepelumab -ekko (TEZSPIRE )   Patient requested Delivery   Delivery date: 08/05/24   Verified address: [address] [place] [address]   Medication will be filled on 08/04/24.

## 2024-08-04 ENCOUNTER — Other Ambulatory Visit: Payer: Self-pay

## 2024-08-17 ENCOUNTER — Ambulatory Visit: Admitting: Internal Medicine

## 2024-08-17 NOTE — Progress Notes (Incomplete)
    Patient Care Team: Perri Ronal PARAS, MD as PCP - General  Visit Date: 08/17/24  Subjective:    Patient ID: Robin Chaney , Female   DOB: 05/08/1951, 73 y.o.    MRN: [id number]   73 y.o. Female presents today for  Weight loss medication. Patient has a past medical history of Anxiety, Chronic back pain.     Past Medical History:  Diagnosis Date   Allergic conjunctivitis    Allergic rhinitis    Anxiety    Arthritis    Asthma    Bronchitis    Chronic back pain    Depression    Dyspnea    asthma related - sob with exertion   GERD (gastroesophageal reflux disease)    no meds   HLD (hyperlipidemia)    Insomnia    Pneumonia    x several   Recurrent upper respiratory infection (URI)      Family History  Problem Relation Age of Onset   Emphysema Other        GF   Allergies Mother    Asthma Mother    Rheum arthritis Mother    Breast cancer Mother    Heart disease Mother    Allergies Sister    Lung cancer Father        was a smoker   Heart disease Father     Social History   Social History Narrative   Not on file      ROS      Objective:   Vitals: There were no vitals taken for this visit.   Physical Exam    Results:   Studies obtained and personally reviewed by me:  Imaging, colonoscopy, mammogram, bone density scan, echocardiogram, heart cath, stress test, CT calcium  score, etc. ***   Labs:       Component Value Date/Time   NA 134 (L) 12/02/2023 0931   K 3.1 (L) 12/02/2023 0931   CL 106 12/02/2023 0931   CO2 22 12/02/2023 0931   GLUCOSE 143 (H) 12/02/2023 0931   BUN 11 12/02/2023 0931   CREATININE 0.72 12/02/2023 0931   CREATININE 0.63 09/19/2023 0954   CALCIUM  8.3 (L) 12/02/2023 0931   PROT 6.5 09/19/2023 0954   ALBUMIN  3.8 05/17/2016 1131   AST 19 09/19/2023 0954   ALT 13 09/19/2023 0954   ALKPHOS 61 05/17/2016 1131   BILITOT 0.3 09/19/2023 0954   GFRNONAA >60 12/02/2023 0931   GFRNONAA 94 12/03/2019 0912   GFRAA 109  12/03/2019 0912     Lab Results  Component Value Date   WBC 3.2 (L) 02/23/2024   HGB 14.1 02/23/2024   HCT 44.1 02/23/2024   MCV 94 02/23/2024   PLT 310 02/23/2024    Lab Results  Component Value Date   CHOL 178 09/19/2023   HDL 59 09/19/2023   LDLCALC 94 09/19/2023   TRIG 156 (H) 09/19/2023   CHOLHDL 3.0 09/19/2023    No results found for: HGBA1C   Lab Results  Component Value Date   TSH 3.00 09/19/2023     No results found for: PSA1, PSA *** delete for female pts  ***    Assessment & Plan:   ***    I,Makayla C Reid,acting as a scribe for Ronal PARAS Perri, MD.,have documented all relevant documentation on the behalf of Ronal PARAS Perri, MD,as directed by  Ronal PARAS Perri, MD while in the presence of Ronal PARAS Perri, MD.   ***

## 2024-08-26 ENCOUNTER — Other Ambulatory Visit (HOSPITAL_COMMUNITY): Payer: Self-pay

## 2024-08-31 ENCOUNTER — Other Ambulatory Visit: Payer: Self-pay

## 2024-08-31 NOTE — Progress Notes (Signed)
 Specialty Pharmacy Refill Coordination Note  Robin Chaney is a 73 y.o. female contacted today regarding refills of specialty medication(s) Tezepelumab -ekko (TEZSPIRE )   Patient requested Delivery   Delivery date: 09/14/24   Verified address: [address] [place] [address]   Medication will be filled on: 09/13/24

## 2024-09-09 NOTE — Progress Notes (Signed)
 Annual Wellness Visit   Patient Care Team: Perri Ronal PARAS, MD as PCP - General  Visit Date: 09/23/2024   Chief Complaint  Patient presents with   Annual Exam   Medicare Wellness    [facility] at [facility]    Subjective:  Patient: Robin Chaney, Female DOB: 01/28/1951, 73 y.o. MRN: [id number] Vitals:   09/23/24 1108  BP: 130/80   Robin Chaney is a 73 y.o. Female who presents today for her Annual Wellness Visit. Patient has Seasonal and perennial allergic rhinoconjunctivitis; INSOMNIA; DYSPNEA; History of migraine headaches; Focal atelectasis R base p apparent aspiration of FB (rice) ; Not well controlled moderate persistent asthma; Recurrent infections; Dizziness; Elevated blood pressure reading; Lumbar stenosis with neurogenic claudication; and Acute non-recurrent frontal sinusitis on their problem list.  History of anxiety/depression treated with Lexapro  20 mg daily, alprazolam  0.5 mg twice daily as needed.  Longstanding history of insomnia treated with Ambien .  She says it doesn't work much anymore.   History of allergic rhinitis treated with Allegra, Singulair .  Has Ventolin  inhaler on hand as well as Symbicort  inhaler.    History of chronic back pain. She has received epidural injections in the past. Had a recent epidural but is having pain again that radiates down leg. Has tried a little bit of physical therapy but this has not helped her back pain. She had an anterolateral decompression of L2-L5 on 12/01/2023. She said that her back feels much better but she still gets some soreness. She said that she has been able to do more exercise.    She had an episode in January 2017 with right lower back pain radiating into right leg.  This was treated with a course of prednisone  and improved.    History of elevated cholesterol treated with rosuvastatin  5 mg daily. 09/21/2024 Lipid Panel normal.   History of bilateral breast implants.     History of  GE reflux.    Had colonoscopy in 2018.  5-year follow-up was recommended.     Labs 09/21/2024 WBC 3.5, Blood glucose 106, Creatinine 0.43, BUN/Creatinine ratio 28, Otherwise WNL.   12/27/2022 Mammogram Stable mass in the right breast at 10 o'clock, likely a complicated cyst. Given stability for 2 years is considered benign. Stable to slight decrease in size focal fibrocystic changes in the he right breast at 1 o'clock. Given stability for 2 years is considered benign. No mammographic evidence of malignancy bilaterally. Repeat in one year.    Vaccine counseling: Influenza vaccine received today. Covid-19 vaccine declined. Tetanus vaccine due.    Heath maintenance: Bone density declined, Colonoscopy due.   Health Maintenance  Topic Date Due   DTaP/Tdap/Td (1 - Tdap) Never done   Colonoscopy  06/02/2022   Mammogram  12/27/2023   Influenza Vaccine  05/07/2024   Medicare Annual Wellness (AWV)  09/21/2024   COVID-19 Vaccine (6 - 2025-26 season) 10/09/2024 (Originally 06/07/2024)   Bone Density Scan  09/23/2025 (Originally 11/28/2015)   Pneumococcal Vaccine: 50+ Years  Completed   Meningococcal B Vaccine  Aged Out   Hepatitis C Screening  Discontinued   Zoster Vaccines- Shingrix  Discontinued    Review of Systems  Constitutional:  Negative for fever and malaise/fatigue.  HENT:  Negative for congestion.   Eyes:  Negative for blurred vision.  Respiratory:  Negative for cough and shortness of breath.   Cardiovascular:  Negative for chest pain, palpitations and leg swelling.  Gastrointestinal:  Negative for vomiting.  Musculoskeletal:  Negative for back pain.  Skin:  Negative for rash.  Neurological:  Negative for loss of consciousness and headaches.   Objective:  Vitals: body mass index is 26.39 kg/m. Today's Vitals   09/23/24 1108  BP: 130/80  Pulse: 78  SpO2: 95%  Weight: 149 lb (67.6 kg)  Height: 5' 3 (1.6 m)  PainSc: 0-No pain   Physical Exam Vitals and nursing note  reviewed.  Constitutional:      General: She is not in acute distress.    Appearance: Normal appearance. She is not ill-appearing or toxic-appearing.  HENT:     Head: Normocephalic and atraumatic.     Right Ear: Hearing, tympanic membrane, ear canal and external ear normal.     Left Ear: Hearing, tympanic membrane, ear canal and external ear normal.     Mouth/Throat:     Pharynx: Oropharynx is clear.  Eyes:     Extraocular Movements: Extraocular movements intact.     Pupils: Pupils are equal, round, and reactive to light.  Neck:     Thyroid : No thyroid  mass, thyromegaly or thyroid  tenderness.     Vascular: No carotid bruit.  Cardiovascular:     Rate and Rhythm: Normal rate and regular rhythm. No extrasystoles are present.    Pulses:          Dorsalis pedis pulses are 2+ on the right side and 2+ on the left side.     Heart sounds: Normal heart sounds. No murmur heard.    No friction rub. No gallop.  Pulmonary:     Effort: Pulmonary effort is normal.     Breath sounds: Normal breath sounds. No decreased breath sounds, wheezing, rhonchi or rales.  Chest:     Chest wall: No mass.  Abdominal:     Palpations: Abdomen is soft. There is no hepatomegaly, splenomegaly or mass.     Tenderness: There is no abdominal tenderness.     Hernia: No hernia is present.  Musculoskeletal:     Cervical back: Normal range of motion.     Right lower leg: No edema.     Left lower leg: No edema.  Lymphadenopathy:     Cervical: No cervical adenopathy.     Upper Body:     Right upper body: No supraclavicular adenopathy.     Left upper body: No supraclavicular adenopathy.  Skin:    General: Skin is warm and dry.  Neurological:     General: No focal deficit present.     Mental Status: She is alert and oriented to person, place, and time. Mental status is at baseline.     Sensory: Sensation is intact.     Motor: Motor function is intact. No weakness.     Deep Tendon Reflexes: Reflexes are normal and  symmetric.  Psychiatric:        Attention and Perception: Attention normal.        Mood and Affect: Mood normal.        Speech: Speech normal.        Behavior: Behavior normal.        Thought Content: Thought content normal.        Cognition and Memory: Cognition normal.        Judgment: Judgment normal.     Current Outpatient Medications  Medication Instructions   albuterol  (VENTOLIN  HFA) 108 (90 Base) MCG/ACT inhaler 2 puffs, Inhalation, Every 6 hours PRN   ALPRAZolam  (XANAX ) 0.5 MG tablet TAKE 1 TABLET(0.5 MG) BY MOUTH TWICE DAILY  AS NEEDED FOR ANXIETY   BREZTRI  AEROSPHERE 160-9-4.8 MCG/ACT AERO inhaler USE 2 INHALATIONS IN THE MORNING AND AT BEDTIME WITH SPACER AND RINSE MOUTH AFTERWARDS   Cholecalciferol (VITAMIN D3 PO) 1,000 Units, Daily at bedtime   escitalopram  (LEXAPRO ) 20 mg, Oral, Daily   fexofenadine (ALLEGRA) 180 mg, Daily PRN   montelukast  (SINGULAIR ) 10 mg, Oral, Daily at bedtime   Olopatadine -Mometasone (RYALTRIS ) 665-25 MCG/ACT SUSP 2 sprays, Nasal, 2 times daily   predniSONE  (DELTASONE ) 10 MG tablet Begin prednisone  10 mg tablets. Take 2 tablets twice a day for 3 days, then take 2 tablets once a day for 1 day, then take 1 tablet on the 5th day, then stop   rosuvastatin  (CRESTOR ) 5 mg, Oral, Daily   Spacer/Aero-Holding Chambers (AEROCHAMBER MV) inhaler Use as instructed   Tezspire  210 mg, Subcutaneous, Every 28 days   tretinoin  (RETIN-A ) 0.1 % cream APPLY TO AFFECTED AREAS AT BEDTIME AS DIRECTED   zolpidem  (AMBIEN ) 10 mg, Oral, Daily at bedtime   zolpidem  (AMBIEN ) 10 mg, Oral, Daily at bedtime   Past Medical History:  Diagnosis Date   Allergic conjunctivitis    Allergic rhinitis    Anxiety    Arthritis    Asthma    Bronchitis    Chronic back pain    Depression    Dyspnea    asthma related - sob with exertion   GERD (gastroesophageal reflux disease)    no meds   HLD (hyperlipidemia)    Insomnia    Pneumonia    x several   Recurrent upper respiratory  infection (URI)    Medical/Surgical History Narrative:  Allergic/Intolerant to:  Allergies  Allergen Reactions   Codeine Nausea Only    Past Surgical History:  Procedure Laterality Date   ANTERIOR LAT LUMBAR FUSION Right 12/01/2023   Procedure: LUMBAR TWO-THREE, LUMBAR THREE-FOUR, LUMBAR FOUR-FIVE ANTERIOR LATERAL INTERBODY FUSION;  Surgeon: Colon Shove, MD;  Location: [facility] OR;  Service: Neurosurgery;  Laterality: Right;  C3   AUGMENTATION MAMMAPLASTY     COLONOSCOPY  2018   LUMBAR PERCUTANEOUS PEDICLE SCREW 3 LEVEL N/A 12/01/2023   Procedure: LUMBAR TWO-THREE, LUMBAR THREE-FOUR, LUMBAR FOUR-FIVE PERCUTANEOUS PEDICLE SCREW PLACEMENT;  Surgeon: Colon Shove, MD;  Location: [facility] OR;  Service: Neurosurgery;  Laterality: N/A;   TONSILLECTOMY AND ADENOIDECTOMY  1977   VIDEO BRONCHOSCOPY Bilateral 04/07/2018   Procedure: VIDEO BRONCHOSCOPY WITHOUT FLUORO;  Surgeon: Darlean Ozell NOVAK, MD;  Location: [facility];  Service: Cardiopulmonary;  Laterality: Bilateral;   Family History  Problem Relation Age of Onset   Emphysema Other        GF   Allergies Mother    Asthma Mother    Rheum arthritis Mother    Breast cancer Mother    Heart disease Mother    Allergies Sister    Lung cancer Father        was a smoker   Heart disease Father     Social history: She is retired.  Previously worked as an [employer] at [employer].  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  She travels.   Most Recent Health Risks Assessment:   Most Recent Social Determinants of Health (Including Hx of Tobacco, Alcohol, and Drug Use) SDOH Screenings   Food Insecurity: Patient Unable To Answer (12/02/2023)  Housing: Unknown (12/02/2023)  Transportation Needs: Patient Unable To Answer (12/02/2023)  Utilities: Patient Unable To Answer (12/02/2023)  Alcohol Screen: Low Risk (09/22/2023)  Depression (PHQ2-9): Low  Risk (09/22/2023)  Financial Resource Strain: Low Risk (09/22/2023)  Physical  Activity: Sufficiently Active (09/22/2023)  Social Connections: Patient Unable To Answer (12/02/2023)  Recent Concern: Social Connections - Socially Isolated (09/22/2023)  Stress: No Stress Concern Present (09/22/2023)  Tobacco Use: Low Risk (09/23/2024)  Health Literacy: Adequate Health Literacy (09/22/2023)   Social History   Tobacco Use   Smoking status: Never   Smokeless tobacco: Never  Vaping Use   Vaping status: Never Used  Substance Use Topics   Alcohol use: Yes    Alcohol/week: 4.0 standard drinks of alcohol    Types: 4 Standard drinks or equivalent per week    Comment: on weekends wine   Drug use: No   Most Recent Functional Status Assessment:    11/25/2023   11:41 AM  In your present state of health, do you have any difficulty performing the following activities:  Doing errands, shopping? 0   Most Recent Fall Risk Assessment:    09/22/2023    9:24 AM  Fall Risk   Falls in the past year? 0  Number falls in past yr: 0  Injury with Fall? 0   Risk for fall due to : No Fall Risks  Follow up Education provided;Falls evaluation completed     Data saved with a previous flowsheet row definition   Most Recent Anxiety/Depression Screenings:    09/22/2023    9:31 AM 08/04/2023    2:46 PM  PHQ 2/9 Scores  PHQ - 2 Score 0 0    Most Recent Cognitive Screening:    09/22/2023    9:32 AM  6CIT Screen  What Year? 0 points  What month? 0 points  What time? 0 points  Count back from 20 0 points  Months in reverse 0 points  Repeat phrase 0 points  Total Score 0 points    Results:  Studies Obtained And Personally Reviewed By Me:  12/27/2022 Mammogram Stable mass in the right breast at 10 o'clock, likely a complicated cyst. Given stability for 2 years is considered benign. Stable to slight decrease in size focal fibrocystic changes in the he right breast at 1 o'clock. Given stability for 2 years is considered benign. No mammographic evidence of malignancy  bilaterally. Repeat in one year.    Labs:  CBC w/ Differential Lab Results  Component Value Date   WBC 3.5 (L) 09/21/2024   RBC 4.70 09/21/2024   HGB 14.0 09/21/2024   HCT 42.2 09/21/2024   PLT 328 09/21/2024   MCV 89.8 09/21/2024   MCH 29.8 09/21/2024   MCHC 33.2 09/21/2024   RDW 13.0 09/21/2024   MPV 9.9 09/21/2024   LYMPHSABS 1.4 02/23/2024   MONOABS 528 05/17/2016   BASOSABS 49 09/21/2024    Comprehensive Metabolic Panel Lab Results  Component Value Date   NA 139 09/21/2024   K 4.5 09/21/2024   CL 107 09/21/2024   CO2 25 09/21/2024   GLUCOSE 106 (H) 09/21/2024   BUN 12 09/21/2024   CREATININE 0.43 (L) 09/21/2024   CALCIUM  9.3 09/21/2024   PROT 6.7 09/21/2024   ALBUMIN  3.8 05/17/2016   AST 19 09/21/2024   ALT 12 09/21/2024   ALKPHOS 61 05/17/2016   BILITOT 0.5 09/21/2024   EGFR 103 09/21/2024   GFRNONAA >60 12/02/2023   Lipid Panel  Lab Results  Component Value Date   CHOL 150 09/21/2024   HDL 57 09/21/2024   LDLCALC 71 09/21/2024   TRIG 132 09/21/2024   TSH Lab Results  Component Value  Date   TSH 2.48 09/21/2024    Assessment & Plan:   Orders Placed This Encounter  Procedures   Amb Referral to Colonoscopy    Referral Priority:   Routine    Referral Type:   Consultation    Referred to Provider:   Rosalie Kitchens, MD    Requested Specialty:   Gastroenterology    Number of Visits Requested:   1   POCT URINALYSIS DIP (CLINITEK)   Anxiety/depression: treated with Lexapro  20 mg daily, alprazolam  0.5 mg twice daily as needed.  Longstanding history of insomnia treated with Ambien .  She says it doesn't work much anymore.   allergic rhinitis:  treated with Allegra, Singulair .  Has Ventolin  inhaler on hand as well as Symbicort  inhaler.    Chronic back pain: She has received epidural injections in the past. Had a recent epidural but is having pain again that radiates down leg. Has tried a little bit of physical therapy but this has not helped her back pain.  She had an anterolateral decompression of L2-L5 on 12/01/2023. She said that her back feels much better but she still gets some soreness. She said that she has been able to do more exercise.    elevated cholesterol:  treated with rosuvastatin  5 mg daily. 09/21/2024 Lipid Panel normal.  12/27/2022 Mammogram Stable mass in the right breast at 10 o'clock, likely a complicated cyst. Given stability for 2 years is considered benign. Stable to slight decrease in size focal fibrocystic changes in the he right breast at 1 o'clock. Given stability for 2 years is considered benign. No mammographic evidence of malignancy bilaterally. Repeat in one year.    Vaccine counseling: Influenza vaccine received today. Covid-19 vaccine declined. Tetanus vaccine due.    Heath maintenance: Bone density declined, Colonoscopy due.   Colonoscopy ordered.      Annual Wellness Visit done today including the all of the following: Reviewed patient's Family Medical History Reviewed patient's SDOH and reviewed tobacco, alcohol, and drug use.  Reviewed and updated list of patient's medical providers Assessment of cognitive impairment was done Assessed patient's functional ability Established a written schedule for health screening services Health Risk Assessent Completed and Reviewed  Discussed health benefits of physical activity, and encouraged her to engage in regular exercise appropriate for her age and condition.    I,Makayla C Reid,acting as a scribe for Ronal JINNY Hailstone, MD.,have documented all relevant documentation on the behalf of Ronal JINNY Hailstone, MD,as directed by  Ronal JINNY Hailstone, MD while in the presence of Ronal JINNY Hailstone, MD.  I, Ronal JINNY Hailstone, MD, have reviewed all documentation for and agree with the above Annual Wellness Visit documentation.  Ronal JINNY Hailstone, MD Internal Medicine 09/23/2024

## 2024-09-13 ENCOUNTER — Other Ambulatory Visit: Payer: Self-pay

## 2024-09-21 ENCOUNTER — Other Ambulatory Visit: Payer: Medicare Other

## 2024-09-21 DIAGNOSIS — E781 Pure hyperglyceridemia: Secondary | ICD-10-CM | POA: Diagnosis not present

## 2024-09-21 DIAGNOSIS — Z0000 Encounter for general adult medical examination without abnormal findings: Secondary | ICD-10-CM

## 2024-09-22 LAB — COMPREHENSIVE METABOLIC PANEL WITH GFR
AG Ratio: 2 (calc) (ref 1.0–2.5)
ALT: 12 U/L (ref 6–29)
AST: 19 U/L (ref 10–35)
Albumin: 4.5 g/dL (ref 3.6–5.1)
Alkaline phosphatase (APISO): 96 U/L (ref 37–153)
BUN/Creatinine Ratio: 28 (calc) — ABNORMAL HIGH (ref 6–22)
BUN: 12 mg/dL (ref 7–25)
CO2: 25 mmol/L (ref 20–32)
Calcium: 9.3 mg/dL (ref 8.6–10.4)
Chloride: 107 mmol/L (ref 98–110)
Creat: 0.43 mg/dL — ABNORMAL LOW (ref 0.60–1.00)
Globulin: 2.2 g/dL (ref 1.9–3.7)
Glucose, Bld: 106 mg/dL — ABNORMAL HIGH (ref 65–99)
Potassium: 4.5 mmol/L (ref 3.5–5.3)
Sodium: 139 mmol/L (ref 135–146)
Total Bilirubin: 0.5 mg/dL (ref 0.2–1.2)
Total Protein: 6.7 g/dL (ref 6.1–8.1)
eGFR: 103 mL/min/1.73m2 (ref >=60)

## 2024-09-22 LAB — CBC WITH DIFFERENTIAL/PLATELET
Absolute Lymphocytes: 1155 {cells}/uL (ref 850–3900)
Absolute Monocytes: 658 {cells}/uL (ref 200–950)
Basophils Absolute: 49 {cells}/uL (ref 0–200)
Basophils Relative: 1.4 %
Eosinophils Absolute: 60 {cells}/uL (ref 15–500)
Eosinophils Relative: 1.7 %
HCT: 42.2 % (ref 35.9–46.0)
Hemoglobin: 14 g/dL (ref 11.7–15.5)
MCH: 29.8 pg (ref 27.0–33.0)
MCHC: 33.2 g/dL (ref 31.6–35.4)
MCV: 89.8 fL (ref 81.4–101.7)
MPV: 9.9 fL (ref 7.5–12.5)
Monocytes Relative: 18.8 %
Neutro Abs: 1579 {cells}/uL (ref 1500–7800)
Neutrophils Relative %: 45.1 %
Platelets: 328 Thousand/uL (ref 140–400)
RBC: 4.7 Million/uL (ref 3.80–5.10)
RDW: 13 % (ref 11.0–15.0)
Total Lymphocyte: 33 %
WBC: 3.5 Thousand/uL — ABNORMAL LOW (ref 3.8–10.8)

## 2024-09-22 LAB — LIPID PANEL
Cholesterol: 150 mg/dL (ref <200)
HDL: 57 mg/dL (ref >=50)
LDL Cholesterol (Calc): 71 mg/dL
Non-HDL Cholesterol (Calc): 93 mg/dL (ref <130)
Total CHOL/HDL Ratio: 2.6 (calc) (ref <5.0)
Triglycerides: 132 mg/dL (ref <150)

## 2024-09-22 LAB — TSH: TSH: 2.48 m[IU]/L (ref 0.40–4.50)

## 2024-09-23 ENCOUNTER — Other Ambulatory Visit: Payer: Self-pay | Admitting: Allergy

## 2024-09-23 ENCOUNTER — Encounter: Payer: Self-pay | Admitting: Internal Medicine

## 2024-09-23 ENCOUNTER — Ambulatory Visit: Payer: Medicare Other | Admitting: Internal Medicine

## 2024-09-23 ENCOUNTER — Telehealth: Payer: Self-pay | Admitting: Internal Medicine

## 2024-09-23 VITALS — BP 130/80 | HR 78 | Ht 63.0 in | Wt 149.0 lb

## 2024-09-23 DIAGNOSIS — M545 Low back pain, unspecified: Secondary | ICD-10-CM | POA: Diagnosis not present

## 2024-09-23 DIAGNOSIS — G4700 Insomnia, unspecified: Secondary | ICD-10-CM

## 2024-09-23 DIAGNOSIS — Z23 Encounter for immunization: Secondary | ICD-10-CM

## 2024-09-23 DIAGNOSIS — K219 Gastro-esophageal reflux disease without esophagitis: Secondary | ICD-10-CM

## 2024-09-23 DIAGNOSIS — F418 Other specified anxiety disorders: Secondary | ICD-10-CM

## 2024-09-23 DIAGNOSIS — J4520 Mild intermittent asthma, uncomplicated: Secondary | ICD-10-CM

## 2024-09-23 DIAGNOSIS — E781 Pure hyperglyceridemia: Secondary | ICD-10-CM

## 2024-09-23 DIAGNOSIS — Z0000 Encounter for general adult medical examination without abnormal findings: Secondary | ICD-10-CM

## 2024-09-23 DIAGNOSIS — J309 Allergic rhinitis, unspecified: Secondary | ICD-10-CM

## 2024-09-23 DIAGNOSIS — F419 Anxiety disorder, unspecified: Secondary | ICD-10-CM

## 2024-09-23 LAB — POCT URINALYSIS DIP (CLINITEK)
Bilirubin, UA: NEGATIVE
Blood, UA: NEGATIVE
Glucose, UA: NEGATIVE mg/dL
Ketones, POC UA: NEGATIVE mg/dL
Leukocytes, UA: NEGATIVE
Nitrite, UA: NEGATIVE
POC PROTEIN,UA: NEGATIVE
Spec Grav, UA: 1.01 (ref 1.010–1.025)
Urobilinogen, UA: 0.2 U/dL
pH, UA: 6.5 (ref 5.0–8.0)

## 2024-09-23 NOTE — Progress Notes (Signed)
 "  Chief Complaint  Patient presents with   Annual Exam   Medicare Wellness     Subjective:   Robin Chaney is a 73 y.o. female who presents for a Medicare Annual Wellness Visit.  Visit info / Clinical Intake: Medicare Wellness Visit Type:: Subsequent Annual Wellness Visit Persons participating in visit and providing information:: patient Medicare Wellness Visit Mode:: In-person (required for WTM) Interpreter Needed?: No Pre-visit prep was completed: yes AWV questionnaire completed by patient prior to visit?: no Living arrangements:: (!) lives alone Patient's Overall Health Status Rating: very good Typical amount of pain: some Does pain affect daily life?: no Are you currently prescribed opioids?: no  Dietary Habits and Nutritional Risks How many meals a day?: 2 Eats fruit and vegetables daily?: yes Most meals are obtained by: preparing own meals In the last 2 weeks, have you had any of the following?: none Diabetic:: no  Functional Status Activities of Daily Living (to include ambulation/medication): Independent Ambulation: Independent Medication Administration: Independent Home Management (perform basic housework or laundry): Independent Manage your own finances?: (!) no Primary transportation is: driving Concerns about vision?: no *vision screening is required for WTM* Concerns about hearing?: no  Fall Screening Falls in the past year?: 0 Number of falls in past year: 0 Was there an injury with Fall?: 0 Fall Risk Category Calculator: 0 Patient Fall Risk Level: Low Fall Risk  Fall Risk Patient at Risk for Falls Due to: No Fall Risks Fall risk Follow up: Falls evaluation completed; Education provided; Falls prevention discussed  Home and Transportation Safety: All rugs have non-skid backing?: (!) no All stairs or steps have railings?: yes Grab bars in the bathtub or shower?: (!) no Have non-skid surface in bathtub or shower?: (!) no Good home lighting?:  yes Regular seat belt use?: yes Hospital stays in the last year:: (!) yes How many hospital stays:: 1 Reason: lumbar fusion  Cognitive Assessment Difficulty concentrating, remembering, or making decisions? : no Will 6CIT or Mini Cog be Completed: no 6CIT or Mini Cog Declined: patient alert, oriented, able to answer questions appropriately and recall recent events  Advance Directives (For Healthcare) Does Patient Have a Medical Advance Directive?: Yes Does patient want to make changes to medical advance directive?: No - Patient declined Type of Advance Directive: Living will; Healthcare Power of Attorney Copy of Healthcare Power of Attorney in Chart?: No - copy requested Copy of Living Will in Chart?: No - copy requested  Reviewed/Updated  Reviewed/Updated: Reviewed All (Medical, Surgical, Family, Medications, Allergies, Care Teams, Patient Goals)    Allergies (verified) Codeine   Current Medications (verified) Outpatient Encounter Medications as of 09/23/2024  Medication Sig   ALPRAZolam  (XANAX ) 0.5 MG tablet TAKE 1 TABLET(0.5 MG) BY MOUTH TWICE DAILY AS NEEDED FOR ANXIETY   Cholecalciferol (VITAMIN D3 PO) Take 1,000 Units by mouth at bedtime.   escitalopram  (LEXAPRO ) 20 MG tablet TAKE 1 TABLET DAILY   fexofenadine (ALLEGRA) 180 MG tablet Take 180 mg by mouth daily as needed for allergies.   rosuvastatin  (CRESTOR ) 5 MG tablet TAKE 1 TABLET DAILY   Spacer/Aero-Holding Chambers (AEROCHAMBER MV) inhaler Use as instructed   Tezepelumab -ekko (TEZSPIRE ) 210 MG/1. SOAJ Inject 210 mg into the skin every 28 (twenty-eight) days.   tretinoin  (RETIN-A ) 0.1 % cream APPLY TO AFFECTED AREAS AT BEDTIME AS DIRECTED   zolpidem  (AMBIEN ) 10 MG tablet TAKE 1 TABLET AT BEDTIME   zolpidem  (AMBIEN ) 10 MG tablet Take 1 tablet (10 mg total) by mouth at bedtime.   [  DISCONTINUED] Budeson-Glycopyrrol-Formoterol  (BREZTRI  AEROSPHERE) 160-9-4.8 MCG/ACT AERO Inhale 2 puffs into the lungs in the morning and  at bedtime. with spacer and rinse mouth afterwards.   albuterol  (VENTOLIN  HFA) 108 (90 Base) MCG/ACT inhaler Inhale 2 puffs into the lungs every 6 (six) hours as needed for wheezing or shortness of breath. (Patient not taking: Reported on 09/23/2024)   montelukast  (SINGULAIR ) 10 MG tablet TAKE 1 TABLET AT BEDTIME (Patient not taking: Reported on 09/23/2024)   Olopatadine -Mometasone (RYALTRIS ) 665-25 MCG/ACT SUSP Place 2 sprays into the nose in the morning and at bedtime. (Patient not taking: Reported on 09/23/2024)   predniSONE  (DELTASONE ) 10 MG tablet Begin prednisone  10 mg tablets. Take 2 tablets twice a day for 3 days, then take 2 tablets once a day for 1 day, then take 1 tablet on the 5th day, then stop (Patient not taking: Reported on 09/23/2024)   [DISCONTINUED] Albuterol -Budesonide  (AIRSUPRA ) 90-80 MCG/ACT AERO Inhale 2 puffs into the lungs every 4 (four) hours as needed (coughing, wheezing, chest tightness). Do not exceed 12 puffs in 24 hours.   [DISCONTINUED] azithromycin  (ZITHROMAX  Z-PAK) 250 MG tablet 2 tablets on day 1, then 1 tablet on days 2-5. (Patient not taking: Reported on 06/17/2024)   [DISCONTINUED] gabapentin  (NEURONTIN ) 300 MG capsule Take 2 capsules by mouth at bedtime or up to 3 times daily as needed for pain (Patient not taking: Reported on 04/21/2024)   [DISCONTINUED] methocarbamol  (ROBAXIN ) 500 MG tablet Take 1 tablet (500 mg total) by mouth every 6 (six) hours as needed for muscle spasms. (Patient not taking: Reported on 09/23/2024)   Facility-Administered Encounter Medications as of 09/23/2024  Medication   tezepelumab -ekko (TEZSPIRE ) 210 MG/1. syringe 210 mg    History: Past Medical History:  Diagnosis Date   Allergic conjunctivitis    Allergic rhinitis    Anxiety    Arthritis    Asthma    Bronchitis    Chronic back pain    Depression    Dyspnea    asthma related - sob with exertion   GERD (gastroesophageal reflux disease)    no meds   HLD  (hyperlipidemia)    Insomnia    Pneumonia    x several   Recurrent upper respiratory infection (URI)    Past Surgical History:  Procedure Laterality Date   ANTERIOR LAT LUMBAR FUSION Right 12/01/2023   Procedure: LUMBAR TWO-THREE, LUMBAR THREE-FOUR, LUMBAR FOUR-FIVE ANTERIOR LATERAL INTERBODY FUSION;  Surgeon: Colon Shove, MD;  Location: [facility] OR;  Service: Neurosurgery;  Laterality: Right;  C3   AUGMENTATION MAMMAPLASTY     COLONOSCOPY  2018   LUMBAR PERCUTANEOUS PEDICLE SCREW 3 LEVEL N/A 12/01/2023   Procedure: LUMBAR TWO-THREE, LUMBAR THREE-FOUR, LUMBAR FOUR-FIVE PERCUTANEOUS PEDICLE SCREW PLACEMENT;  Surgeon: Colon Shove, MD;  Location: [facility] OR;  Service: Neurosurgery;  Laterality: N/A;   TONSILLECTOMY AND ADENOIDECTOMY  1977   VIDEO BRONCHOSCOPY Bilateral 04/07/2018   Procedure: VIDEO BRONCHOSCOPY WITHOUT FLUORO;  Surgeon: Darlean Ozell NOVAK, MD;  Location: [facility];  Service: Cardiopulmonary;  Laterality: Bilateral;   Family History  Problem Relation Age of Onset   Emphysema Other        GF   Allergies Mother    Asthma Mother    Rheum arthritis Mother    Breast cancer Mother    Heart disease Mother    Allergies Sister    Lung cancer Father        was a smoker   Heart disease Father    Social History   Occupational History  Occupation: Teacher, Adult Education: [employer]    Comment: Short Stay  Tobacco Use   Smoking status: Never   Smokeless tobacco: Never  Vaping Use   Vaping status: Never Used  Substance and Sexual Activity   Alcohol use: Yes    Alcohol/week: 4.0 standard drinks of alcohol    Types: 4 Standard drinks or equivalent per week    Comment: on weekends wine   Drug use: No   Sexual activity: Not Currently    Birth control/protection: Post-menopausal   Tobacco Counseling Counseling given: No  SDOH Screenings   Food Insecurity: No Food Insecurity (09/23/2024)  Housing: Low Risk (09/23/2024)  Transportation Needs: No Transportation Needs (09/23/2024)   Utilities: Not At Risk (09/23/2024)  Alcohol Screen: Low Risk (09/23/2024)  Depression (PHQ2-9): Low Risk (09/23/2024)  Financial Resource Strain: Low Risk (09/23/2024)  Physical Activity: Sufficiently Active (09/23/2024)  Social Connections: Socially Isolated (09/23/2024)  Stress: Stress Concern Present (09/23/2024)  Tobacco Use: Low Risk (09/23/2024)  Health Literacy: Adequate Health Literacy (09/23/2024)   See flowsheets for full screening details  Depression Screen PHQ 2 & 9 Depression Scale- Over the past 2 weeks, how often have you been bothered by any of the following problems? Little interest or pleasure in doing things: 0 Feeling down, depressed, or hopeless (PHQ Adolescent also includes...irritable): 0 PHQ-2 Total Score: 0 Trouble falling or staying asleep, or sleeping too much: 0 Feeling tired or having little energy: 0 Poor appetite or overeating (PHQ Adolescent also includes...weight loss): 0 Feeling bad about yourself - or that you are a failure or have let yourself or your family down: 0 Trouble concentrating on things, such as reading the newspaper or watching television (PHQ Adolescent also includes...like school work): 0 Moving or speaking so slowly that other people could have noticed. Or the opposite - being so fidgety or restless that you have been moving around a lot more than usual: 0 Thoughts that you would be better off dead, or of hurting yourself in some way: 0 PHQ-9 Total Score: 0 If you checked off any problems, how difficult have these problems made it for you to do your work, take care of things at home, or get along with other people?: Not difficult at all     Goals Addressed               This Visit's Progress     Maintain or improve exercise capacity (6 minute walk) (pt-stated)                      Objective:    Today's Vitals   09/23/24 1108  BP: 130/80  Pulse: 78  SpO2: 95%  Weight: 149 lb (67.6 kg)  Height: 5' 3 (1.6 m)   PainSc: 0-No pain   Body mass index is 26.39 kg/m.  Hearing/Vision screen No results found. Immunizations and Health Maintenance Health Maintenance  Topic Date Due   DTaP/Tdap/Td (1 - Tdap) Never done   Colonoscopy  06/02/2022   Mammogram  12/27/2023   COVID-19 Vaccine (6 - 2025-26 season) 10/09/2024 (Originally 06/07/2024)   Bone Density Scan  09/23/2025 (Originally 11/28/2015)   Medicare Annual Wellness (AWV)  09/23/2025   Pneumococcal Vaccine: 50+ Years  Completed   Influenza Vaccine  Completed   Meningococcal B Vaccine  Aged Out   Hepatitis C Screening  Discontinued   Zoster Vaccines- Shingrix  Discontinued        Assessment/Plan:  This is a routine wellness examination  for Robin Chaney.  Patient Care Team: Perri Ronal PARAS, MD as PCP - General  I have personally reviewed and noted the following in the patients chart:   Medical and social history Use of alcohol, tobacco or illicit drugs  Current medications and supplements including opioid prescriptions. Functional ability and status Nutritional status Physical activity Advanced directives List of other physicians Hospitalizations, surgeries, and ER visits in previous 12 months Vitals Screenings to include cognitive, depression, and falls Referrals and appointments  Orders Placed This Encounter  Procedures   Flu vaccine HIGH DOSE PF(Fluzone Trivalent)   Amb Referral to Colonoscopy    Referral Priority:   Routine    Referral Type:   Consultation    Referred to Provider:   Rosalie Kitchens, MD    Requested Specialty:   Gastroenterology    Number of Visits Requested:   1   POCT URINALYSIS DIP (CLINITEK)   In addition, I have reviewed and discussed with patient certain preventive protocols, quality metrics, and best practice recommendations. A written personalized care plan for preventive services as well as general preventive health recommendations were provided to patient.   Robin Chaney, CMA   09/23/2024    Return in 1 year (on 09/23/2025).  After Visit Summary: (In Person-Printed) AVS printed and given to the patient    . IRonal PARAS Perri, MD, have reviewed all documentation for this visit. The documentation on 09/23/2024 for the exam, diagnosis, procedures, and orders are all accurate and complete.  "

## 2024-09-23 NOTE — Patient Instructions (Addendum)
 Robin Chaney,  Thank you for taking the time for your Medicare Wellness Visit. I appreciate your continued commitment to your health goals. Please review the care plan we discussed, and feel free to reach out if I can assist you further.  Please note that Annual Wellness Visits do not include a physical exam. Some assessments may be limited, especially if the visit was conducted virtually. If needed, we may recommend an in-person follow-up with your provider.  Ongoing Care Seeing your primary care provider every 3 to 6 months helps us  monitor your health and provide consistent, personalized care.   Referrals If a referral was made during today's visit and you haven't received any updates within two weeks, please contact the referred provider directly to check on the status.  Recommended Screenings:  Health Maintenance  Topic Date Due   DTaP/Tdap/Td vaccine (1 - Tdap) Never done   Colon Cancer Screening  06/02/2022   Breast Cancer Screening  12/27/2023   Flu Shot  05/07/2024   Medicare Annual Wellness Visit  09/21/2024   COVID-19 Vaccine (6 - 2025-26 season) 10/09/2024*   Osteoporosis screening with Bone Density Scan  09/23/2025*   Pneumococcal Vaccine for age over 25  Completed   Meningitis B Vaccine  Aged Out   Hepatitis C Screening  Discontinued   Zoster (Shingles) Vaccine  Discontinued  *Topic was postponed. The date shown is not the original due date.       09/23/2024   12:28 PM  Advanced Directives  Does Patient Have a Medical Advance Directive? Yes  Type of Advance Directive Living will;Healthcare Power of Attorney  Does patient want to make changes to medical advance directive? No - Patient declined  Copy of Healthcare Power of Attorney in Chart? No - copy requested    Vision: Annual vision screenings are recommended for early detection of glaucoma, cataracts, and diabetic retinopathy. These exams can also reveal signs of chronic conditions such as diabetes and high  blood pressure.  Dental: Annual dental screenings help detect early signs of oral cancer, gum disease, and other conditions linked to overall health, including heart disease and diabetes.  Please see the attached documents for additional preventive care recommendations.  Continue current meds and return in one year or as needed.  Please continue current medications and return in 6-12 months or as needed.

## 2024-09-24 NOTE — Telephone Encounter (Signed)
 Done

## 2024-10-04 ENCOUNTER — Other Ambulatory Visit (HOSPITAL_COMMUNITY): Payer: Self-pay

## 2024-10-05 NOTE — Progress Notes (Unsigned)
 "  Follow Up Note  RE: Robin Chaney MRN: [id number] DOB: [birth year] Date of Office Visit: 10/06/2024  Referring provider: Perri Ronal PARAS, MD Primary care provider: Perri Ronal PARAS, MD  Chief Complaint: No chief complaint on file.  History of Present Illness: I had the pleasure of seeing Robin Chaney for a follow up visit at the [facility] on 10/06/2024. She is a 73 y.o. female, who is being followed for asthma, recurrent infections, allergic rhino conjunctivitis. Her previous allergy  office visit was on 06/17/2024 with [name], FNP. Today is a regular follow up visit.  Discussed the use of AI scribe software for clinical note transcription with the patient, who gave verbal consent to proceed.  History of Present Illness            Robin Chaney on 05/13/2024  Assessment and Plan: Robin Chaney is a 74 y.o. female with: Not well controlled moderate persistent asthma Past history - 2023 CXR showed peribronchial thickening. Using albuterol  once a day at least. Used Symbicort  for 1 week then stopped as she was concerned if vaginal bleeding was caused by this inhaler. Covid-19 infection 1 year ago. 2023 spirometry showed: possible restrictive disease with 6% improvement in FEV1 post bronchodilator treatment. Clinically feeling improved. No coughing post treatment.  Interim history - Mild exacerbation with cough and sputum production. Current episode less severe than previous but concerned due to recent spinal surgery and health status. Airsupra  helps better than albuterol  but too expensive.  Today's spirometry some restriction. Start azithromycin  antibiotics as prescribed. If not improving, let us  know and will send in a low dose prednisone . Regarding the spacer - you can buy one on amazon if it's cheaper than the pharmacy. Daily controller medication(s): continue Breztri  2 puffs twice a day with spacer and rinse mouth afterwards. Start PA for Tezspire  injections  every 4 weeks - pamphlet given. Continue Singulair  (montelukast ) 10mg  daily at night. May use Airsupra  rescue inhaler 2 puffs every 4 to 6 hours as needed for shortness of breath, chest tightness, coughing, and wheezing. Do not use more than 12 puffs in 24 hours. May use Airsupra  rescue inhaler 2 puffs 5 to 15 minutes prior to strenuous physical activities. Rinse mouth after each use.  Coupon given. Sample given. Monitor frequency of use - if you need to use it more than twice per week on a consistent basis let us  know.  OR  May use albuterol  rescue inhaler 2 puffs every 4 to 6 hours as needed for shortness of breath, chest tightness, coughing, and wheezing. May use albuterol  rescue inhaler 2 puffs 5 to 15 minutes prior to strenuous physical activities. Monitor frequency of use - if you need to use it more than twice per week on a consistent basis let us  know.    Recurrent infections Past history - Frequent bronchitis episodes. Up to date with flu, pneumonia and Covid-19 vaccines. 2023 bloodwork showed normal immunoglobulin levels, good protection against diptheria, tetanus and pneumoniae.  Interim history - required 2 rounds of antibiotics the last time. Start zpak. Keep track of infections and antibiotics use.   Seasonal allergic rhinitis due to pollen Allergic rhinitis due to animal dander Allergic rhinitis due to mold Allergic conjunctivitis of both eyes Past history - Used to be on AIT for 1 year with good benefit. 1 dog at home. 2023 bloodwork positive to dog, grass pollen and mold.  Interim history - stable. Use over the counter antihistamines such as Zyrtec (cetirizine), Claritin  (  loratadine ), Allegra (fexofenadine), or Xyzal (levocetirizine) daily as needed. May take twice a day during allergy  flares. May switch antihistamines every few months. May use Ryaltris  (olopatadine  + mometasone nasal spray combination) 1-2 sprays per nostril twice a day.  Nasal saline spray (i.e., Simply  Saline) or nasal saline lavage (i.e., NeilMed) is recommended as needed and prior to medicated nasal sprays. May use Refresh re-wetting eye drops.    Assessment and Plan              No follow-ups on file.  No orders of the defined types were placed in this encounter.  Lab Orders  No laboratory test(s) ordered today    Diagnostics: Spirometry:  Tracings reviewed. Her effort: {Blank single:19197::Good reproducible efforts.,It was hard to get consistent efforts and there is a question as to whether this reflects a maximal maneuver.,Poor effort, data can not be interpreted.} FVC: ***L FEV1: ***L, ***% predicted FEV1/FVC ratio: ***% Interpretation: {Blank single:19197::Spirometry consistent with mild obstructive disease,Spirometry consistent with moderate obstructive disease,Spirometry consistent with severe obstructive disease,Spirometry consistent with possible restrictive disease,Spirometry consistent with mixed obstructive and restrictive disease,Spirometry uninterpretable due to technique,Spirometry consistent with normal pattern,No overt abnormalities noted given today's efforts}.  Please see scanned spirometry results for details.  Skin Testing: {Blank single:19197::Select foods,Environmental allergy  panel,Environmental allergy  panel and select foods,Food allergy  panel,None,Deferred due to recent antihistamines use}. *** Results discussed with patient/family.   Medication List:  Current Outpatient Medications  Medication Sig Dispense Refill   albuterol  (VENTOLIN  HFA) 108 (90 Base) MCG/ACT inhaler Inhale 2 puffs into the lungs every 6 (six) hours as needed for wheezing or shortness of breath. (Patient not taking: Reported on 09/23/2024) 54 g 3   ALPRAZolam  (XANAX ) 0.5 MG tablet TAKE 1 TABLET(0.5 MG) BY MOUTH TWICE DAILY AS NEEDED FOR ANXIETY 60 tablet 3   BREZTRI  AEROSPHERE 160-9-4.8 MCG/ACT AERO inhaler USE 2 INHALATIONS IN THE MORNING AND AT  BEDTIME WITH SPACER AND RINSE MOUTH AFTERWARDS 32.1 g 3   Cholecalciferol (VITAMIN D3 PO) Take 1,000 Units by mouth at bedtime.     escitalopram  (LEXAPRO ) 20 MG tablet TAKE 1 TABLET DAILY 90 tablet 3   fexofenadine (ALLEGRA) 180 MG tablet Take 180 mg by mouth daily as needed for allergies.     montelukast  (SINGULAIR ) 10 MG tablet TAKE 1 TABLET AT BEDTIME (Patient not taking: Reported on 09/23/2024) 90 tablet 3   Olopatadine -Mometasone (RYALTRIS ) 665-25 MCG/ACT SUSP Place 2 sprays into the nose in the morning and at bedtime. (Patient not taking: Reported on 09/23/2024) 29 g 5   predniSONE  (DELTASONE ) 10 MG tablet Begin prednisone  10 mg tablets. Take 2 tablets twice a day for 3 days, then take 2 tablets once a day for 1 day, then take 1 tablet on the 5th day, then stop (Patient not taking: Reported on 09/23/2024) 15 tablet 0   rosuvastatin  (CRESTOR ) 5 MG tablet TAKE 1 TABLET DAILY 90 tablet 3   Spacer/Aero-Holding Chambers (AEROCHAMBER MV) inhaler Use as instructed 1 each 2   Tezepelumab -ekko (TEZSPIRE ) 210 MG/1. SOAJ Inject 210 mg into the skin every 28 (twenty-eight) days. 1.91 mL 11   tretinoin  (RETIN-A ) 0.1 % cream APPLY TO AFFECTED AREAS AT BEDTIME AS DIRECTED 45 g 1   zolpidem  (AMBIEN ) 10 MG tablet TAKE 1 TABLET AT BEDTIME 90 tablet 1   zolpidem  (AMBIEN ) 10 MG tablet Take 1 tablet (10 mg total) by mouth at bedtime. 90 tablet 1   Current Facility-Administered Medications  Medication Dose Route Frequency Provider Last Rate Last Admin  tezepelumab -ekko (TEZSPIRE ) 210 MG/1. syringe 210 mg  210 mg Subcutaneous Q28 days Luke Orlan HERO, DO   210 mg at 05/13/24 1120   Allergies: Allergies[1] I reviewed her past medical history, social history, family history, and environmental history and no significant changes have been reported from her previous visit.  Review of Systems  Constitutional:  Negative for appetite change, chills, fever and unexpected weight change.  HENT:  Negative for  congestion and rhinorrhea.   Eyes:  Negative for itching.  Respiratory:  Positive for cough, chest tightness, shortness of breath and wheezing.   Cardiovascular:  Negative for chest pain.  Gastrointestinal:  Negative for abdominal pain.  Genitourinary:  Negative for difficulty urinating.  Skin:  Negative for rash.  Allergic/Immunologic: Positive for environmental allergies.    Objective: There were no vitals taken for this visit. There is no height or weight on file to calculate BMI. Physical Exam Vitals and nursing note reviewed.  Constitutional:      Appearance: Normal appearance. She is well-developed.  HENT:     Head: Normocephalic and atraumatic.     Right Ear: Tympanic membrane and external ear normal.     Left Ear: Tympanic membrane and external ear normal.     Nose: Nose normal.     Mouth/Throat:     Mouth: Mucous membranes are moist.     Pharynx: Oropharynx is clear.  Eyes:     Conjunctiva/sclera: Conjunctivae normal.  Cardiovascular:     Rate and Rhythm: Normal rate and regular rhythm.     Heart sounds: Normal heart sounds. No murmur heard.    No friction rub. No gallop.  Pulmonary:     Effort: Pulmonary effort is normal.     Breath sounds: Normal breath sounds. No wheezing, rhonchi or rales.  Musculoskeletal:     Cervical back: Neck supple.  Skin:    General: Skin is warm.     Findings: No rash.  Neurological:     Mental Status: She is alert and oriented to person, place, and time.  Psychiatric:        Behavior: Behavior normal.    Previous notes and tests were reviewed. The plan was reviewed with the patient/family, and all questions/concerned were addressed.  It was my pleasure to see Banita today and participate in her care. Please feel free to contact me with any questions or concerns.  Sincerely,  Orlan Luke, DO Allergy  & Immunology  [facility]  [place] office: [phone] [facility] office: [phone]     [1]  Allergies Allergen Reactions   Codeine Nausea Only   "

## 2024-10-06 ENCOUNTER — Other Ambulatory Visit: Payer: Self-pay

## 2024-10-06 ENCOUNTER — Ambulatory Visit: Admitting: Allergy

## 2024-10-06 ENCOUNTER — Encounter: Payer: Self-pay | Admitting: Allergy

## 2024-10-06 ENCOUNTER — Telehealth: Payer: Self-pay | Admitting: Allergy

## 2024-10-06 VITALS — BP 128/86 | HR 66 | Temp 98.0°F | Resp 16 | Ht 64.0 in | Wt 150.3 lb

## 2024-10-06 DIAGNOSIS — J3081 Allergic rhinitis due to animal (cat) (dog) hair and dander: Secondary | ICD-10-CM | POA: Diagnosis not present

## 2024-10-06 DIAGNOSIS — R053 Chronic cough: Secondary | ICD-10-CM | POA: Diagnosis not present

## 2024-10-06 DIAGNOSIS — J301 Allergic rhinitis due to pollen: Secondary | ICD-10-CM | POA: Diagnosis not present

## 2024-10-06 DIAGNOSIS — B999 Unspecified infectious disease: Secondary | ICD-10-CM

## 2024-10-06 DIAGNOSIS — J3089 Other allergic rhinitis: Secondary | ICD-10-CM

## 2024-10-06 DIAGNOSIS — J4550 Severe persistent asthma, uncomplicated: Secondary | ICD-10-CM | POA: Diagnosis not present

## 2024-10-06 DIAGNOSIS — H1013 Acute atopic conjunctivitis, bilateral: Secondary | ICD-10-CM | POA: Diagnosis not present

## 2024-10-06 MED ORDER — AIRSUPRA 90-80 MCG/ACT IN AERO: 2.0000 | INHALATION_SPRAY | RESPIRATORY_TRACT | 2 refills | Status: AC | PRN

## 2024-10-06 MED ORDER — AZELASTINE-FLUTICASONE 137-50 MCG/ACT NA SUSP: 1.0000 | Freq: Two times a day (BID) | NASAL | 5 refills | Status: AC

## 2024-10-06 MED ORDER — BUDESONIDE 0.5 MG/2ML IN SUSP: 0.5000 mg | Freq: Every day | RESPIRATORY_TRACT | 2 refills | Status: AC

## 2024-10-06 NOTE — Patient Instructions (Addendum)
 Wheezing/coughing Get Chest X-ray. Refer to ENT.   Breathing  Daily controller medication(s): continue Breztri  2 puffs twice a day with spacer and rinse mouth afterwards. Start Pulmicort  (budesonide ) 0.5mg  nebulizer once a day at night. Nebulizer machine given.   Continue Tezspire  injections every 4 weeks.  Continue Singulair  (montelukast ) 10mg  daily at night. May use Airsupra  rescue inhaler 2 puffs every 4 to 6 hours as needed for shortness of breath, chest tightness, coughing, and wheezing. Do not use more than 12 puffs in 24 hours. May use Airsupra  rescue inhaler 2 puffs 5 to 15 minutes prior to strenuous physical activities. Rinse mouth after each use.  Monitor frequency of use - if you need to use it more than twice per week on a consistent basis let us  know.  Breathing control goals:  Full participation in all desired activities (may need albuterol  before activity) Albuterol  use two times or less a week on average (not counting use with activity) Cough interfering with sleep two times or less a month Oral steroids no more than once a year No hospitalizations   Environmental allergies 2023 bloodwork positive to dog, grass pollen and mold.  Use over the counter antihistamines such as Zyrtec (cetirizine), Claritin  (loratadine ), Allegra (fexofenadine), or Xyzal (levocetirizine) daily as needed. May take twice a day during allergy  flares. May switch antihistamines every few months. Start dymista (fluticasone  + azelastine nasal spray combination) 1 spray per nostril twice a day. This replaces your other nasal sprays. If it's not covered let us  know.  Nasal saline spray (i.e., Simply Saline) or nasal saline lavage (i.e., NeilMed) is recommended as needed and prior to medicated nasal sprays. May use Refresh re-wetting eye drops.   Recurrent infections Keep track of infections and antibiotics use. If persistent will get bloodwork next to look at immune system.   Follow up in 6 weeks or  sooner if needed.   Pet Allergen Avoidance: Contrary to popular opinion, there are no hypoallergenic breeds of dogs or cats. That is because people are not allergic to an animals hair, but to an allergen found in the animal's saliva, dander (dead skin flakes) or urine. Pet allergy  symptoms typically occur within minutes. For some people, symptoms can build up and become most severe 8 to 12 hours after contact with the animal. People with severe allergies can experience reactions in public places if dander has been transported on the pet owners clothing. Keeping an animal outdoors is only a partial solution, since homes with pets in the yard still have higher concentrations of animal allergens. Before getting a pet, ask your allergist to determine if you are allergic to animals. If your pet is already considered part of your family, try to minimize contact and keep the pet out of the bedroom and other rooms where you spend a great deal of time. As with dust mites, vacuum carpets often or replace carpet with a hardwood floor, tile or linoleum. High-efficiency particulate air (HEPA) cleaners can reduce allergen levels over time. While dander and saliva are the source of cat and dog allergens, urine is the source of allergens from rabbits, hamsters, mice and guinea pigs; so ask a non-allergic family member to clean the animals cage. If you have a pet allergy , talk to your allergist about the potential for allergy  immunotherapy (allergy  shots). This strategy can often provide long-term relief. Reducing Pollen Exposure Pollen seasons: trees (spring), grass (summer) and ragweed/weeds (fall). Keep windows closed in your home and car to lower pollen exposure.  Install  air conditioning in the bedroom and throughout the house if possible.  Avoid going out in dry windy days - especially early morning. Pollen counts are highest between 5 - 10 AM and on dry, hot and windy days.  Save outside activities for  late afternoon or after a heavy rain, when pollen levels are lower.  Avoid mowing of grass if you have grass pollen allergy . Be aware that pollen can also be transported indoors on people and pets.  Dry your clothes in an automatic dryer rather than hanging them outside where they might collect pollen.  Rinse hair and eyes before bedtime. Mold Control Mold and fungi can grow on a variety of surfaces provided certain temperature and moisture conditions exist.  Outdoor molds grow on plants, decaying vegetation and soil. The major outdoor mold, Alternaria and Cladosporium, are found in very high numbers during hot and dry conditions. Generally, a late summer - fall peak is seen for common outdoor fungal spores. Rain will temporarily lower outdoor mold spore count, but counts rise rapidly when the rainy period ends. The most important indoor molds are Aspergillus and Penicillium. Dark, humid and poorly ventilated basements are ideal sites for mold growth. The next most common sites of mold growth are the bathroom and the kitchen. Outdoor (Seasonal) Mold Control Use air conditioning and keep windows closed. Avoid exposure to decaying vegetation. Avoid leaf raking. Avoid grain handling. Consider wearing a face mask if working in moldy areas.  Indoor (Perennial) Mold Control  Maintain humidity below 50%. Get rid of mold growth on hard surfaces with water, detergent and, if necessary, 5% bleach (do not mix with other cleaners). Then dry the area completely. If mold covers an area more than 10 square feet, consider hiring an indoor environmental professional. For clothing, washing with soap and water is best. If moldy items cannot be cleaned and dried, throw them away. Remove sources e.g. contaminated carpets. Repair and seal leaking roofs or pipes. Using dehumidifiers in damp basements may be helpful, but empty the water and clean units regularly to prevent mildew from forming. All rooms, especially  basements, bathrooms and kitchens, require ventilation and cleaning to deter mold and mildew growth. Avoid carpeting on concrete or damp floors, and storing items in damp areas.

## 2024-10-06 NOTE — Progress Notes (Signed)
 Specialty Pharmacy Refill Coordination Note  Robin Chaney is a 73 y.o. female contacted today regarding refills of specialty medication(s) Tezepelumab -ekko (TEZSPIRE )   Patient requested Delivery   Delivery date: 10/12/24   Verified address: [address] [place]  [id number]   Medication will be filled on: 10/11/24

## 2024-10-06 NOTE — Telephone Encounter (Signed)
 Refer to ENT for chronic coughing/wheezing. Rule out any VCD or GERD issues. Thank you.

## 2024-10-11 ENCOUNTER — Telehealth: Payer: Self-pay | Admitting: Allergy

## 2024-10-11 ENCOUNTER — Ambulatory Visit
Admission: RE | Admit: 2024-10-11 | Discharge: 2024-10-11 | Disposition: A | Discharge status: Home / Self Care | Source: Ambulatory Visit | Attending: Allergy | Admitting: Allergy

## 2024-10-11 ENCOUNTER — Other Ambulatory Visit: Payer: Self-pay

## 2024-10-11 ENCOUNTER — Ambulatory Visit: Admitting: Internal Medicine

## 2024-10-11 DIAGNOSIS — R053 Chronic cough: Secondary | ICD-10-CM

## 2024-10-11 NOTE — Telephone Encounter (Signed)
 Please call patient back.  I'm still waiting on the read on the chest X-ray.  Any fevers/chills?  Regarding antibiotics - I can send one in. Does she remember if augmentin , azithromycin  or doxycycline  worked better for her in the past?

## 2024-10-11 NOTE — Telephone Encounter (Signed)
 Robin Chaney called and stated that she had a Chest X-ray done today as per Dr. Luke, and that she has been using her nebulizer treatments. She states is still suffering from a lot of coughing and congestion and now her mucus is green. She feels like she needs an antibiotics, and wants to know if something can be sent in for her as she was just here a few days ago.

## 2024-10-12 MED ORDER — AZITHROMYCIN 250 MG PO TABS: ORAL_TABLET | ORAL | 0 refills | Status: AC

## 2024-10-12 NOTE — Telephone Encounter (Signed)
 Spoke with patient and she stated she just had night sweat but could not remember is she had a true fever.  Stated that Z-Pak worked the best. Surveyor, [employer]

## 2024-10-12 NOTE — Addendum Note (Signed)
 Addended by: Kymora Sciara M on: 10/12/2024 08:36 AM   Modules accepted: Orders

## 2024-10-13 ENCOUNTER — Encounter: Payer: Self-pay | Admitting: Allergy

## 2024-10-18 ENCOUNTER — Encounter: Payer: Self-pay | Admitting: Allergy

## 2024-10-20 ENCOUNTER — Ambulatory Visit: Payer: Self-pay | Admitting: Allergy

## 2024-10-20 NOTE — Telephone Encounter (Signed)
 Robin Chaney is scheduled for 11/11/24 at 11:30 am with Dr. Anice.

## 2024-10-27 ENCOUNTER — Other Ambulatory Visit: Payer: Self-pay

## 2024-10-27 ENCOUNTER — Other Ambulatory Visit (HOSPITAL_COMMUNITY): Payer: Self-pay

## 2024-10-27 ENCOUNTER — Institutional Professional Consult (permissible substitution) (INDEPENDENT_AMBULATORY_CARE_PROVIDER_SITE_OTHER): Admitting: Physician Assistant

## 2024-10-27 NOTE — Progress Notes (Unsigned)
 Benefits Investigation Started  Reason: Copay is $1,640.98  Routed to: Rx Prior Insurance Account Manager (Juliana)

## 2024-10-28 ENCOUNTER — Telehealth: Payer: Self-pay

## 2024-10-28 ENCOUNTER — Encounter: Payer: Self-pay | Admitting: Internal Medicine

## 2024-10-28 NOTE — Telephone Encounter (Signed)
 Patient copay at this time for Tezspire :   Copay is $1,640.98

## 2024-10-29 ENCOUNTER — Other Ambulatory Visit: Payer: Self-pay

## 2024-10-29 ENCOUNTER — Other Ambulatory Visit: Payer: Self-pay | Admitting: Internal Medicine

## 2024-11-01 ENCOUNTER — Other Ambulatory Visit: Payer: Self-pay

## 2024-11-02 ENCOUNTER — Other Ambulatory Visit (HOSPITAL_COMMUNITY): Payer: Self-pay

## 2024-11-04 ENCOUNTER — Other Ambulatory Visit (HOSPITAL_COMMUNITY): Payer: Self-pay

## 2024-11-05 ENCOUNTER — Other Ambulatory Visit: Payer: Self-pay

## 2024-11-09 ENCOUNTER — Other Ambulatory Visit (HOSPITAL_COMMUNITY): Payer: Self-pay

## 2024-11-11 ENCOUNTER — Institutional Professional Consult (permissible substitution) (INDEPENDENT_AMBULATORY_CARE_PROVIDER_SITE_OTHER)

## 2024-11-11 ENCOUNTER — Other Ambulatory Visit (HOSPITAL_COMMUNITY): Payer: Self-pay
